# Patient Record
Sex: Female | Born: 2000 | Race: Black or African American | Hispanic: No | Marital: Single
Health system: Southern US, Community
[De-identification: ages and names within clinical notes are randomized; demographics above are authoritative.]

## PROBLEM LIST (undated history)

## (undated) ENCOUNTER — Inpatient Hospital Stay (HOSPITAL_COMMUNITY): Payer: Self-pay

## (undated) DIAGNOSIS — F32A Depression, unspecified: Secondary | ICD-10-CM

## (undated) DIAGNOSIS — F419 Anxiety disorder, unspecified: Secondary | ICD-10-CM

## (undated) DIAGNOSIS — R569 Unspecified convulsions: Secondary | ICD-10-CM

## (undated) DIAGNOSIS — F909 Attention-deficit hyperactivity disorder, unspecified type: Secondary | ICD-10-CM

## (undated) DIAGNOSIS — E669 Obesity, unspecified: Secondary | ICD-10-CM

## (undated) DIAGNOSIS — I10 Essential (primary) hypertension: Secondary | ICD-10-CM

## (undated) DIAGNOSIS — J302 Other seasonal allergic rhinitis: Secondary | ICD-10-CM

## (undated) HISTORY — DX: Depression, unspecified: F32.A

## (undated) HISTORY — DX: Anxiety disorder, unspecified: F41.9

## (undated) HISTORY — DX: Essential (primary) hypertension: I10

## (undated) HISTORY — PX: ADENOIDECTOMY: SUR15

## (undated) HISTORY — DX: Obesity, unspecified: E66.9

## (undated) HISTORY — DX: Unspecified convulsions: R56.9

---
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
## 2021-04-30 ENCOUNTER — Encounter (HOSPITAL_COMMUNITY): Payer: Self-pay | Admitting: Emergency Medicine

## 2021-04-30 ENCOUNTER — Ambulatory Visit (HOSPITAL_COMMUNITY)
Admission: EM | Admit: 2021-04-30 | Discharge: 2021-04-30 | Disposition: A | Discharge status: Home / Self Care | Payer: Medicaid Other | Attending: Emergency Medicine | Admitting: Emergency Medicine

## 2021-04-30 ENCOUNTER — Other Ambulatory Visit: Payer: Self-pay

## 2021-04-30 DIAGNOSIS — Z79899 Other long term (current) drug therapy: Secondary | ICD-10-CM | POA: Insufficient documentation

## 2021-04-30 DIAGNOSIS — J069 Acute upper respiratory infection, unspecified: Secondary | ICD-10-CM | POA: Diagnosis not present

## 2021-04-30 DIAGNOSIS — R0602 Shortness of breath: Secondary | ICD-10-CM | POA: Diagnosis not present

## 2021-04-30 DIAGNOSIS — R051 Acute cough: Secondary | ICD-10-CM | POA: Insufficient documentation

## 2021-04-30 DIAGNOSIS — Z20822 Contact with and (suspected) exposure to covid-19: Secondary | ICD-10-CM | POA: Diagnosis not present

## 2021-04-30 DIAGNOSIS — J029 Acute pharyngitis, unspecified: Secondary | ICD-10-CM | POA: Diagnosis present

## 2021-04-30 LAB — SARS CORONAVIRUS 2 (TAT 6-24 HRS): SARS Coronavirus 2: NEGATIVE

## 2021-04-30 LAB — POCT RAPID STREP A, ED / UC: Streptococcus, Group A Screen (Direct): NEGATIVE

## 2021-04-30 MED ORDER — BENZONATATE 200 MG PO CAPS: 200.0000 mg | ORAL_CAPSULE | Freq: Three times a day (TID) | ORAL | 0 refills | Status: AC | PRN

## 2021-04-30 MED ORDER — IBUPROFEN 600 MG PO TABS: 600.0000 mg | ORAL_TABLET | Freq: Four times a day (QID) | ORAL | 0 refills | Status: DC | PRN

## 2021-04-30 MED ORDER — ONDANSETRON 4 MG PO TBDP: 4.0000 mg | ORAL_TABLET | Freq: Three times a day (TID) | ORAL | 0 refills | Status: DC | PRN

## 2021-04-30 MED ORDER — CETIRIZINE HCL 10 MG PO CAPS: 10.0000 mg | ORAL_CAPSULE | Freq: Every day | ORAL | 0 refills | Status: DC

## 2021-04-30 NOTE — Discharge Instructions (Signed)
Strep negative, COVID pending Rest and fluids Tylenol and ibuprofen as needed Begin daily cetirizine up with congestion and drainage May supplement over-the-counter Mucinex or Sudafed for further relief of congestion May use Tessalon/benzonatate every 8 hours as needed for cough or may use other over-the-counter Robitussin, Delsym or Dimetapp Zofran as needed for nausea Please follow-up if not improving or worsening

## 2021-04-30 NOTE — ED Triage Notes (Signed)
Pt presents with sore throat, cough, nasal congestion, and SOB xs 3-4 days.

## 2021-05-01 NOTE — ED Provider Notes (Signed)
[facility]    CSN: [id number] Arrival date & time: 04/30/21  1131      History   Chief Complaint Chief Complaint  Patient presents with   Sore Throat   Nasal Congestion   Cough   Shortness of Breath    HPI Alison Cummings is a 20 y.o. female presenting today for evaluation of URI symptoms.  Reports over the past 3 to 4 days she has had cough congestion sore throat.  Has had associated nausea and some shortness of breath.  She denies any known fevers.  Denies close sick contacts.  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      Home Medications    Prior to Admission medications   Medication Sig Start Date End Date Taking? Authorizing Provider  benzonatate (TESSALON) 200 MG capsule Take 1 capsule (200 mg total) by mouth 3 (three) times daily as needed for up to 7 days for cough. 04/30/21 05/07/21 Yes Mihail Prettyman C, PA-C  Cetirizine HCl 10 MG CAPS Take 1 capsule (10 mg total) by mouth daily for 10 days. 04/30/21 05/10/21 Yes Chanler Mendonca C, PA-C  cloNIDine (CATAPRES) 0.3 MG tablet Take by mouth. 03/31/15  Yes [provider]  ibuprofen (ADVIL) 600 MG tablet Take 1 tablet (600 mg total) by mouth every 6 (six) hours as needed. 04/30/21  Yes Cheston Coury C, PA-C  ondansetron (ZOFRAN ODT) 4 MG disintegrating tablet Take 1 tablet (4 mg total) by mouth every 8 (eight) hours as needed for nausea or vomiting. 04/30/21  Yes Sylvia Helms C, PA-C  acetaminophen (TYLENOL) 325 MG tablet Take 2 tablets (650  mg total) by mouth every 6 (six) hours as needed. 08/03/18   Mesner, Barbara Cower, MD  ARIPiprazole (ABILIFY) 5 MG tablet Take 1 tablet (5 mg total) by mouth 2 (two) times daily. 08/03/18   Charm Rings, NP  escitalopram (LEXAPRO) 5 MG tablet Take 5 mg by mouth 2 (two) times daily. 04/25/21   [provider]  FOCALIN XR 40 MG CP24 Take 40 mg by mouth every morning. 11/13/17   [provider]  FOCALIN XR 5 MG 24 hr capsule Take 5 mg by mouth daily. 03/26/21   [provider]  INVEGA SUSTENNA 156 MG/ML SUSY injection Inject into the muscle every 30 (thirty) days. 03/28/20   [provider]  risperiDONE (RISPERDAL) 0.5 MG tablet Take 1 tablet (0.5 mg total) by mouth 2 (two) times daily. 08/03/18   Charm Rings, NP    Family History History reviewed. No pertinent family history.  Social History Social History   Tobacco Use   Smoking status: Never   Smokeless tobacco: Never  Substance Use Topics   Alcohol use: No   Drug use: No     Allergies   Patient has no known allergies.   Review of Systems Review of Systems  Constitutional:  Negative for activity change, appetite change, chills, fatigue and fever.  HENT:  Positive for congestion, rhinorrhea and sore throat. Negative for  ear pain, sinus pressure and trouble swallowing.   Eyes:  Negative for discharge and redness.  Respiratory:  Positive for cough and shortness of breath. Negative for chest tightness.   Cardiovascular:  Negative for chest pain.  Gastrointestinal:  Negative for abdominal pain, diarrhea, nausea and vomiting.  Musculoskeletal:  Negative for myalgias.  Skin:  Negative for rash.  Neurological:  Negative for dizziness, light-headedness and headaches.    Physical Exam Triage Vital Signs ED Triage Vitals  Enc Vitals Group     BP 04/30/21 1341 (!) 164/117     Pulse Rate 04/30/21 1341 90     Resp 04/30/21 1341 18     Temp 04/30/21 1341 98.9 F (37.2 C)     Temp Source 04/30/21 1341  Oral     SpO2 04/30/21 1341 97 %     Weight --      Height --      Head Circumference --      Peak Flow --      Pain Score 04/30/21 1339 0     Pain Loc --      Pain Edu? --      Excl. in GC? --    No data found.  Updated Vital Signs BP (!) 164/117 (BP Location: Left Wrist)   Pulse 90   Temp 98.9 F (37.2 C) (Oral)   Resp 18   SpO2 97%   Visual Acuity Right Eye Distance:   Left Eye Distance:   Bilateral Distance:    Right Eye Near:   Left Eye Near:    Bilateral Near:     Physical Exam Vitals and nursing note reviewed.  Constitutional:      Appearance: She is well-developed.     Comments: No acute distress  HENT:     Head: Normocephalic and atraumatic.     Ears:     Comments: Bilateral ears without tenderness to palpation of external auricle, tragus and mastoid, EAC's without erythema or swelling, TM's with good bony landmarks and cone of light. Non erythematous.      Nose: Nose normal.     Mouth/Throat:     Comments: Oral mucosa pink and moist, no tonsillar enlargement or exudate. Posterior pharynx patent and nonerythematous, no uvula deviation or swelling. Normal phonation.  Eyes:     Conjunctiva/sclera: Conjunctivae normal.  Cardiovascular:     Rate and Rhythm: Normal rate.  Pulmonary:     Effort: Pulmonary effort is normal. No respiratory distress.     Comments: Breathing comfortably at rest, CTABL, no wheezing, rales or other adventitious sounds auscultated  Abdominal:     General: There is no distension.  Musculoskeletal:        General: Normal range of motion.     Cervical back: Neck supple.  Skin:    General: Skin is warm and dry.  Neurological:     Mental Status: She is alert and oriented to person, place, and time.     UC Treatments / Results  Labs (all labs ordered are listed, but only abnormal results are displayed) Labs Reviewed  SARS CORONAVIRUS 2 (TAT 6-24 HRS)  CULTURE, GROUP A STREP [facility])  POCT RAPID STREP A, ED / UC     EKG   Radiology No results found.  Procedures Procedures (including critical care time)  Medications Ordered in UC Medications - No data to display  Initial Impression / Assessment and Plan / UC Course  I have reviewed the triage vital signs and the nursing notes.  Pertinent labs & imaging results that were available during my care of the patient were reviewed by me and considered in my medical decision making (see chart for details).     Viral URI with cough- strep negative, Covid test pending, exam reassuring, lungs clear to auscultation, suspect viral etiology and recommend symptomatic and supportive care at this time.  Recommendations provided.  Rest and fluids.  Continue to monitor.  Final Clinical Impressions(s) / UC Diagnoses   Final diagnoses:  Viral URI with cough     Discharge Instructions      Strep negative, COVID pending Rest and fluids Tylenol and ibuprofen as needed Begin daily cetirizine up with congestion and drainage May supplement over-the-counter Mucinex or Sudafed for further relief of congestion May use Tessalon/benzonatate every 8 hours as needed for cough or may use other over-the-counter Robitussin, Delsym or Dimetapp Zofran as needed for nausea Please follow-up if not improving or worsening   ED Prescriptions     Medication Sig Dispense Auth. Provider   Cetirizine HCl 10 MG CAPS Take 1 capsule (10 mg total) by mouth daily for 10 days. 10 capsule Leighanna Kirn C, PA-C   benzonatate (TESSALON) 200 MG capsule Take 1 capsule (200 mg total) by mouth 3 (three) times daily as needed for up to 7 days for cough. 28 capsule Zaniyah Wernette C, PA-C   ibuprofen (ADVIL) 600 MG tablet Take 1 tablet (600 mg total) by mouth every 6 (six) hours as needed. 30 tablet Regina Ganci C, PA-C   ondansetron (ZOFRAN ODT) 4 MG disintegrating tablet Take 1 tablet (4 mg total) by mouth every 8 (eight) hours as needed for nausea or vomiting. 20 tablet Larinda Herter,  [place] C, PA-C      PDMP not reviewed this encounter.   Lew Dawes, [place] 05/01/21 [phone]

## 2021-05-03 LAB — CULTURE, GROUP A STREP (THRC)

## 2021-05-19 ENCOUNTER — Encounter (HOSPITAL_COMMUNITY): Payer: Self-pay | Admitting: *Deleted

## 2021-05-19 ENCOUNTER — Other Ambulatory Visit: Payer: Self-pay

## 2021-05-19 ENCOUNTER — Ambulatory Visit (HOSPITAL_COMMUNITY)
Admission: EM | Admit: 2021-05-19 | Discharge: 2021-05-19 | Disposition: A | Discharge status: Home / Self Care | Payer: Medicaid Other | Attending: Internal Medicine | Admitting: Internal Medicine

## 2021-05-19 DIAGNOSIS — Z3202 Encounter for pregnancy test, result negative: Secondary | ICD-10-CM | POA: Diagnosis present

## 2021-05-19 DIAGNOSIS — R140 Abdominal distension (gaseous): Secondary | ICD-10-CM | POA: Diagnosis present

## 2021-05-19 LAB — POCT URINALYSIS DIPSTICK, ED / UC
Bilirubin Urine: NEGATIVE
Glucose, UA: NEGATIVE mg/dL
Hgb urine dipstick: NEGATIVE
Leukocytes,Ua: NEGATIVE
Nitrite: NEGATIVE
Protein, ur: NEGATIVE mg/dL
Specific Gravity, Urine: 1.025 (ref 1.005–1.030)
Urobilinogen, UA: 0.2 mg/dL (ref 0.0–1.0)
pH: 6.5 (ref 5.0–8.0)

## 2021-05-19 LAB — POC URINE PREG, ED: Preg Test, Ur: NEGATIVE

## 2021-05-19 NOTE — ED Triage Notes (Signed)
Pt requesting pregnancy test.

## 2021-05-19 NOTE — Discharge Instructions (Addendum)
Take a probiotic daily.  Make sure you are drinking plenty of fluids, especially water, and try to increase the amount of fiber in your diet.    You can take Miralax daily as needed for constipation.   Follow up with your OB/GYN for re-evaluation as soon as possible.  Follow up with your primary care provider for re-evaluation of bloating, diarrhea, and constipation.

## 2021-05-19 NOTE — ED Provider Notes (Signed)
[facility]    CSN: [id number] Arrival date & time: 05/19/21  1236      History   Chief Complaint Chief Complaint  Patient presents with   Possible Pregnancy    HPI Alison Cummings is a 20 y.o. female.   Patient here for evaluation of vaginal discharge, nipple tenderness, bloating, diarrhea, and constipation.  Reports bloating and constipation have been ongoing for a while and was given some medication with minimal symptom relief.  Patient is unsure when her LMP was and states that her period is often irregular.  Concerned about a possible pregnancy.  Denies any trauma, injury, or other precipitating event.  Denies any specific alleviating or aggravating factors.  Denies any fevers, chest pain, shortness of breath, N/V/D, numbness, tingling, weakness, or headaches.    The history is provided by the patient.  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      Home Medications    Prior to Admission medications   Medication Sig Start Date End Date Taking? Authorizing Provider  acetaminophen (TYLENOL) 325 MG tablet Take 2 tablets (650 mg total) by mouth every 6 (six) hours as needed. 08/03/18   Mesner, Barbara Cower, MD  ARIPiprazole (ABILIFY) 5 MG tablet Take 1 tablet (5 mg total) by mouth 2 (two) times daily. 08/03/18   Charm Rings, NP  Cetirizine HCl 10 MG CAPS Take 1 capsule (10 mg total) by mouth daily for 10 days. 04/30/21 05/10/21  Wieters, Hallie C, PA-C  cloNIDine (CATAPRES) 0.3 MG tablet Take  by mouth. 03/31/15   [provider]  escitalopram (LEXAPRO) 5 MG tablet Take 5 mg by mouth 2 (two) times daily. 04/25/21   [provider]  FOCALIN XR 40 MG CP24 Take 40 mg by mouth every morning. 11/13/17   [provider]  FOCALIN XR 5 MG 24 hr capsule Take 5 mg by mouth daily. 03/26/21   [provider]  ibuprofen (ADVIL) 600 MG tablet Take 1 tablet (600 mg total) by mouth every 6 (six) hours as needed. 04/30/21   Wieters, Hallie C, PA-C  INVEGA SUSTENNA 156 MG/ML SUSY injection Inject into the muscle every 30 (thirty) days. 03/28/20   [provider]  ondansetron (ZOFRAN ODT) 4 MG disintegrating tablet Take 1 tablet (4 mg total) by mouth every 8 (eight) hours as needed for nausea or vomiting. 04/30/21   Wieters, Hallie C, PA-C  risperiDONE (RISPERDAL) 0.5 MG tablet Take 1 tablet (0.5 mg total) by mouth 2 (two) times daily. 08/03/18   Charm Rings, NP    Family History History reviewed. No pertinent family history.  Social History Social History   Tobacco Use   Smoking status: Never   Smokeless tobacco: Never  Substance Use Topics   Alcohol use: No   Drug use: No     Allergies   Patient has no known allergies.   Review of Systems Review of Systems  Gastrointestinal:  Positive for constipation and diarrhea.  Genitourinary:  Positive for vaginal  discharge. Negative for vaginal bleeding and vaginal pain.  All other systems reviewed and are negative.   Physical Exam Triage Vital Signs ED Triage Vitals  Enc Vitals Group     BP 05/19/21 1409 131/84     Pulse Rate 05/19/21 1409 99     Resp 05/19/21 1409 16     Temp 05/19/21 1409 98.7 F (37.1 C)     Temp src --      SpO2 05/19/21 1409 97 %     Weight --      Height --      Head Circumference --      Peak Flow --      Pain Score 05/19/21 1406 8     Pain Loc --      Pain Edu? --      Excl. in GC? --    No data found.  Updated Vital Signs BP 131/84   Pulse 99   Temp 98.7  F (37.1 C)   Resp 16   LMP  (LMP Unknown)   SpO2 97%   Visual Acuity Right Eye Distance:   Left Eye Distance:   Bilateral Distance:    Right Eye Near:   Left Eye Near:    Bilateral Near:     Physical Exam Vitals and nursing note reviewed.  Constitutional:      General: She is not in acute distress.    Appearance: Normal appearance. She is not ill-appearing, toxic-appearing or diaphoretic.  HENT:     Head: Normocephalic and atraumatic.  Eyes:     Conjunctiva/sclera: Conjunctivae normal.  Cardiovascular:     Rate and Rhythm: Normal rate.     Pulses: Normal pulses.  Pulmonary:     Effort: Pulmonary effort is normal.  Chest:  Breasts:    Right: No nipple discharge.     Left: No nipple discharge.  Abdominal:     General: Abdomen is flat. Bowel sounds are normal.     Palpations: Abdomen is soft.     Tenderness: There is no right CVA tenderness or left CVA tenderness.  Musculoskeletal:        General: Normal range of motion.     Cervical back: Normal range of motion.  Skin:    General: Skin is warm and dry.  Neurological:     General: No focal deficit present.     Mental Status: She is alert and oriented to person, place, and time.  Psychiatric:        Mood and Affect: Mood normal.     UC Treatments / Results  Labs (all labs ordered are listed, but only abnormal results are displayed) Labs Reviewed  POCT URINALYSIS DIPSTICK, ED / UC - Abnormal; Notable for the following components:      Result Value   Ketones, ur TRACE (*)    All other components within normal limits  POC URINE PREG, ED  CERVICOVAGINAL ANCILLARY ONLY    EKG   Radiology No results found.  Procedures Procedures (including critical care time)  Medications Ordered in UC Medications - No data to display  Initial Impression / Assessment and Plan / UC Course  I have reviewed the triage vital signs and the nursing notes.  Pertinent labs & imaging results that were available during my  care of the patient were reviewed by me and considered in my medical decision making (see chart for details).    Assessment negative for red flags or concerns.  Urinalysis positive for ketones but otherwise  negative.  Urine pregnancy test negative.  Recommend following up with OB/GYN for further evaluation of possible nipple discharge.  Self swab obtained and will treat based on results.  Discussed the safe practices including condoms and other barrier method use. Bloating.  Recommend daily probiotic.  Encourage fluids and increase fiber in diet.  May take MiraLAX as needed for constipation.  Follow-up with primary care provider for reevaluation of soon as possible. Final Clinical Impressions(s) / UC Diagnoses   Final diagnoses:  Negative pregnancy test  Bloating     Discharge Instructions      Take a probiotic daily.  Make sure you are drinking plenty of fluids, especially water, and try to increase the amount of fiber in your diet.    You can take Miralax daily as needed for constipation.   Follow up with your OB/GYN for re-evaluation as soon as possible.  Follow up with your primary care provider for re-evaluation of bloating, diarrhea, and constipation.        ED Prescriptions   None    PDMP not reviewed this encounter.   Ivette Loyal, NP 05/19/21 1446

## 2021-05-21 LAB — CERVICOVAGINAL ANCILLARY ONLY
Bacterial Vaginitis (gardnerella): NEGATIVE
Candida Glabrata: NEGATIVE
Candida Vaginitis: NEGATIVE
Chlamydia: NEGATIVE
Comment: NEGATIVE
Comment: NEGATIVE
Comment: NEGATIVE
Comment: NEGATIVE
Comment: NEGATIVE
Comment: NORMAL
Neisseria Gonorrhea: NEGATIVE
Trichomonas: NEGATIVE

## 2021-06-19 ENCOUNTER — Ambulatory Visit: Payer: Self-pay

## 2021-09-19 ENCOUNTER — Other Ambulatory Visit: Payer: Self-pay

## 2021-09-19 ENCOUNTER — Other Ambulatory Visit: Payer: Self-pay | Admitting: Family Medicine

## 2021-09-19 ENCOUNTER — Ambulatory Visit
Admission: RE | Admit: 2021-09-19 | Discharge: 2021-09-19 | Disposition: A | Discharge status: Home / Self Care | Payer: Medicaid Other | Source: Ambulatory Visit | Attending: Family Medicine | Admitting: Family Medicine

## 2021-09-19 DIAGNOSIS — M25569 Pain in unspecified knee: Secondary | ICD-10-CM

## 2022-05-23 ENCOUNTER — Encounter (HOSPITAL_BASED_OUTPATIENT_CLINIC_OR_DEPARTMENT_OTHER): Payer: Self-pay

## 2022-05-23 ENCOUNTER — Other Ambulatory Visit: Payer: Self-pay

## 2022-05-23 ENCOUNTER — Emergency Department (HOSPITAL_BASED_OUTPATIENT_CLINIC_OR_DEPARTMENT_OTHER)
Admission: EM | Admit: 2022-05-23 | Discharge: 2022-05-24 | Disposition: A | Discharge status: Home / Self Care | Payer: Medicaid Other | Attending: Emergency Medicine | Admitting: Emergency Medicine

## 2022-05-23 DIAGNOSIS — R102 Pelvic and perineal pain: Secondary | ICD-10-CM

## 2022-05-23 DIAGNOSIS — N760 Acute vaginitis: Secondary | ICD-10-CM

## 2022-05-23 LAB — COMPREHENSIVE METABOLIC PANEL
ALT: 17 U/L (ref 0–44)
AST: 16 U/L (ref 15–41)
Albumin: 4.2 g/dL (ref 3.5–5.0)
Alkaline Phosphatase: 47 U/L (ref 38–126)
Anion gap: 8 (ref 5–15)
BUN: 12 mg/dL (ref 6–20)
CO2: 25 mmol/L (ref 22–32)
Calcium: 9.5 mg/dL (ref 8.9–10.3)
Chloride: 107 mmol/L (ref 98–111)
Creatinine, Ser: 0.98 mg/dL (ref 0.44–1.00)
GFR, Estimated: >60 mL/min (ref >60)
Glucose, Bld: 65 mg/dL — ABNORMAL LOW (ref 70–99)
Potassium: 3.8 mmol/L (ref 3.5–5.1)
Sodium: 140 mmol/L (ref 135–145)
Total Bilirubin: 0.5 mg/dL (ref 0.3–1.2)
Total Protein: 7.5 g/dL (ref 6.5–8.1)

## 2022-05-23 LAB — URINALYSIS, ROUTINE W REFLEX MICROSCOPIC
Bilirubin Urine: NEGATIVE
Glucose, UA: NEGATIVE mg/dL
Hgb urine dipstick: NEGATIVE
Ketones, ur: NEGATIVE mg/dL
Nitrite: NEGATIVE
Specific Gravity, Urine: 1.037 — ABNORMAL HIGH (ref 1.005–1.030)
pH: 5.5 (ref 5.0–8.0)

## 2022-05-23 LAB — CBC
HCT: 40.6 % (ref 36.0–46.0)
Hemoglobin: 13.1 g/dL (ref 12.0–15.0)
MCH: 26.5 pg (ref 26.0–34.0)
MCHC: 32.3 g/dL (ref 30.0–36.0)
MCV: 82.2 fL (ref 80.0–100.0)
Platelets: 244 10*3/uL (ref 150–400)
RBC: 4.94 MIL/uL (ref 3.87–5.11)
RDW: 14.6 % (ref 11.5–15.5)
WBC: 7.2 10*3/uL (ref 4.0–10.5)
nRBC: 0 % (ref 0.0–0.2)

## 2022-05-23 LAB — LIPASE, BLOOD: Lipase: 17 U/L (ref 11–51)

## 2022-05-23 LAB — HCG, SERUM, QUALITATIVE: Preg, Serum: NEGATIVE

## 2022-05-23 NOTE — ED Triage Notes (Signed)
Patient here POV from Home.  Endorses Bilateral Lower ABD Pain for 2-4 Days now. Worsened today.   No fevers. No N/V/D. Some Vaginal Discharge noted.  NAD Noted during Triage. A&Ox4. GCS 15. Ambulatory.

## 2022-05-24 LAB — WET PREP, GENITAL
Sperm: NONE SEEN
Trich, Wet Prep: NONE SEEN
WBC, Wet Prep HPF POC: <10 (ref <10)
Yeast Wet Prep HPF POC: NONE SEEN

## 2022-05-24 MED ORDER — CEFTRIAXONE SODIUM 500 MG IJ SOLR
500.0000 mg | Freq: Once | INTRAMUSCULAR | Status: AC
  Administered 2022-05-24: 500 mg via INTRAMUSCULAR
  Filled 2022-05-24: qty 500

## 2022-05-24 MED ORDER — METRONIDAZOLE 500 MG PO TABS: 500.0000 mg | ORAL_TABLET | Freq: Two times a day (BID) | ORAL | 0 refills | Status: DC

## 2022-05-24 MED ORDER — AZITHROMYCIN 250 MG PO TABS
1000.0000 mg | ORAL_TABLET | Freq: Once | ORAL | Status: AC
  Administered 2022-05-24: 1000 mg via ORAL
  Filled 2022-05-24: qty 4

## 2022-05-24 NOTE — ED Provider Notes (Signed)
[facility] EMERGENCY DEPT Provider Note   CSN: [id number] Arrival date & time: 05/23/22  2129     History  Chief Complaint  Patient presents with   Abdominal Pain    Alison Cummings is a 21 y.o. female.  Patient is a 21 year old female with past medical history of ADHD, prior diagnosis of gonorrhea.  Patient presenting today with complaints of vaginal discharge and lower abdominal pain.  This has been worsening over the past few days.  She describes a "lotion like" discharge.  She denies any fevers or chills.  She is sexually active with 1 partner and denies this individual experiencing symptoms.  There are no aggravating or alleviating factors.  The history is provided by the patient.       Home Medications Prior to Admission medications   Medication Sig Start Date End Date Taking? Authorizing Provider  acetaminophen (TYLENOL) 325 MG tablet Take 2 tablets (650 mg total) by mouth every 6 (six) hours as needed. 08/03/18   Mesner, Corene Cornea, MD  ARIPiprazole (ABILIFY) 5 MG tablet Take 1 tablet (5 mg total) by mouth 2 (two) times daily. 08/03/18   Patrecia Pour, NP  Cetirizine HCl 10 MG CAPS Take 1 capsule (10 mg total) by mouth daily for 10 days. 04/30/21 05/10/21  Wieters, Hallie C, PA-C  cloNIDine (CATAPRES) 0.3 MG tablet Take by mouth. 03/31/15   [provider]  escitalopram (LEXAPRO) 5 MG tablet Take 5 mg by mouth 2 (two) times daily. 04/25/21   [provider]  FOCALIN XR 40 MG CP24 Take 40 mg by mouth every morning. 11/13/17   [provider]  FOCALIN XR 5 MG 24 hr capsule Take 5 mg by mouth daily. 03/26/21   [provider]  ibuprofen (ADVIL) 600 MG tablet Take 1 tablet (600 mg total) by mouth every 6 (six) hours as needed. 04/30/21   Wieters, Hallie C, PA-C  INVEGA SUSTENNA 156 MG/ML SUSY injection Inject into the muscle every 30 (thirty) days. 03/28/20   [provider]  ondansetron (ZOFRAN ODT) 4 MG disintegrating  tablet Take 1 tablet (4 mg total) by mouth every 8 (eight) hours as needed for nausea or vomiting. 04/30/21   Wieters, Hallie C, PA-C  risperiDONE (RISPERDAL) 0.5 MG tablet Take 1 tablet (0.5 mg total) by mouth 2 (two) times daily. 08/03/18   Patrecia Pour, NP      Allergies    Patient has no known allergies.    Review of Systems   Review of Systems  All other systems reviewed and are negative.   Physical Exam Updated Vital Signs BP (!) 141/88 (BP Location: Right Arm)   Pulse 94   Temp 98.6 F (37 C) (Oral)   Resp 18   Ht 5\' 2"  (1.575 m)   Wt 108.4 kg   SpO2 99%   BMI 43.71 kg/m  Physical Exam Vitals and nursing note reviewed.  Constitutional:      General: She is not in acute distress.    Appearance: She is well-developed. She is not diaphoretic.  HENT:     Head: Normocephalic and atraumatic.  Cardiovascular:     Rate and Rhythm: Normal rate and regular rhythm.     Heart sounds: No murmur heard.    No friction rub. No gallop.  Pulmonary:     Effort: Pulmonary effort is normal. No respiratory distress.     Breath sounds: Normal breath sounds. No wheezing.  Abdominal:     General: Bowel  sounds are normal. There is no distension.     Palpations: Abdomen is soft.     Tenderness: There is abdominal tenderness in the right lower quadrant, suprapubic area and left lower quadrant. There is no right CVA tenderness or left CVA tenderness.  Musculoskeletal:        General: Normal range of motion.     Cervical back: Normal range of motion and neck supple.  Skin:    General: Skin is warm and dry.  Neurological:     General: No focal deficit present.     Mental Status: She is alert and oriented to person, place, and time.     ED Results / Procedures / Treatments   Labs (all labs ordered are listed, but only abnormal results are displayed) Labs Reviewed  COMPREHENSIVE METABOLIC PANEL - Abnormal; Notable for the following components:      Result Value   Glucose, Bld 65  (*)    All other components within normal limits  URINALYSIS, ROUTINE W REFLEX MICROSCOPIC - Abnormal; Notable for the following components:   APPearance HAZY (*)    Specific Gravity, Urine 1.037 (*)    Protein, ur TRACE (*)    Leukocytes,Ua SMALL (*)    Bacteria, UA RARE (*)    All other components within normal limits  WET PREP, GENITAL  LIPASE, BLOOD  CBC  HCG, SERUM, QUALITATIVE  GC/CHLAMYDIA PROBE AMP ([facility]) NOT AT [facility]    EKG None  Radiology No results found.  Procedures Procedures    Medications Ordered in ED Medications - No data to display  ED Course/ Medical Decision Making/ A&P  Patient presenting here with complaints of vaginal discharge and pelvic pain as described in the HPI.    Patient arrives here afebrile and with stable vital signs.  She is clinically well-appearing.  There is mild tenderness across the lower abdomen with no peritoneal signs.  Pelvic examination reveals minimal discharge and no obvious findings.  Work-up initiated including CBC, metabolic panel, urinalysis, wet prep, and GC/chlamydia.  Results thus far show clue cells, but are otherwise unremarkable.  GC and Chlamydia cultures are pending.  Patient has history of gonorrhea in the past and is requesting STD treatment pending cultures.  She will be given Rocephin and Zithromax.  She will also be discharged with Flagyl for presumed BV.  To follow-up as needed.  Final Clinical Impression(s) / ED Diagnoses Final diagnoses:  None    Rx / DC Orders ED Discharge Orders     None         Geoffery Lyons, MD 05/24/22 0126

## 2022-05-24 NOTE — Discharge Instructions (Signed)
Begin taking Flagyl as prescribed.  We will call you if your cultures indicate you require further treatment or need to take additional action.  Return to the emergency department if symptoms significantly worsen or change.

## 2022-05-27 LAB — GC/CHLAMYDIA PROBE AMP (~~LOC~~) NOT AT ARMC
Chlamydia: NEGATIVE
Comment: NEGATIVE
Comment: NORMAL
Neisseria Gonorrhea: NEGATIVE

## 2022-05-30 ENCOUNTER — Inpatient Hospital Stay (HOSPITAL_COMMUNITY)
Admission: AD | Admit: 2022-05-30 | Discharge: 2022-05-30 | Discharge status: Against Medical Advice | Payer: Medicaid Other | Attending: Obstetrics and Gynecology | Admitting: Obstetrics and Gynecology

## 2022-05-30 DIAGNOSIS — Z3202 Encounter for pregnancy test, result negative: Secondary | ICD-10-CM | POA: Diagnosis present

## 2022-05-30 DIAGNOSIS — Z5321 Procedure and treatment not carried out due to patient leaving prior to being seen by health care provider: Secondary | ICD-10-CM | POA: Insufficient documentation

## 2022-05-30 DIAGNOSIS — R109 Unspecified abdominal pain: Secondary | ICD-10-CM | POA: Diagnosis not present

## 2022-05-30 LAB — POCT PREGNANCY, URINE: Preg Test, Ur: NEGATIVE

## 2022-05-30 LAB — HCG, QUANTITATIVE, PREGNANCY: hCG, Beta Chain, Quant, S: <1 m[IU]/mL (ref <5)

## 2022-05-30 LAB — ABO/RH: ABO/RH(D): O POS

## 2022-05-30 NOTE — MAU Note (Signed)
.  Alison Cummings is a 21 y.o. at Unknown here in MAU reporting: right sided flank and lower ABD cramping all afternoon yesterday and pink VB and a small clot with wiping since 0100. Pt stats she went to [facility] last week and had a neg HCG, but she was not feeling well and took a home preg test and it was pos with a faint line. Pt report has BV and been on flagyl last week.  LMP: irreg periods, 09/27/2021 Onset of complaint: yesterday Pain score: 7/10 Vitals:   05/30/22 0146  BP: 126/71  Pulse: 89  Resp: 18  Temp: 98.3 F (36.8 C)  SpO2: 100%      Lab orders placed from triage:  UPT

## 2022-05-30 NOTE — MAU Provider Note (Signed)
None     S Ms. LAGINA READER is a 21 y.o. No obstetric history on file. patient who presents to MAU today with complaint of light bleeding, lower abdominal cramping and faint positive pregnancy test (only observed "a long time" after taking test) States has not had a period since Feb Saw Eve Key in office and "she didn't do anything but say "birth control".  When questioned further, it seems that she did discuss IUD and ordered a Provera challenge. Pt never had a withdrawal bleed but did not call Eve back to report this. Does not plan to get pregnant.  States doesn't know why she is not using birth control or why she never called office about followup or with recent symptoms.  Discussed we do only OB care here, will need to send blood for HCG.  HCG negative 6 days ago in ED.Marland Kitchen   RN Note: ADELFA LOZITO is a 21 y.o. at Unknown here in MAU reporting: right sided flank and lower ABD cramping all afternoon yesterday and pink VB and a small clot with wiping since 0100. Pt stats she went to [facility] last week and had a neg HCG, but she was not feeling well and took a home preg test and it was pos with a faint line. Pt report has BV and been on flagyl last week.   Results for orders placed or performed during the hospital encounter of 05/30/22 (from the past 24 hour(s))  Pregnancy, urine POC     Status: None   Collection Time: 05/30/22  1:54 AM  Result Value Ref Range   Preg Test, Ur NEGATIVE NEGATIVE  hCG, quantitative, pregnancy     Status: None   Collection Time: 05/30/22  2:16 AM  Result Value Ref Range   hCG, Beta Chain, Quant, S <1 <5 mIU/mL  ABO/Rh     Status: None   Collection Time: 05/30/22  2:22 AM  Result Value Ref Range   ABO/RH(D) O POS    No rh immune globuloin      NOT A RH IMMUNE GLOBULIN CANDIDATE, PT RH POSITIVE Performed at [facility], [address], [place], [place] [address]     O BP 126/71 (BP Location: Right Arm)   Pulse 89   Temp 98.3 F (36.8  C) (Oral)   Resp 18   Ht 5\' 2"  (1.575 m)   Wt 106.7 kg   SpO2 100%   BMI 43.02 kg/m  Physical Exam Constitutional:      General: She is not in acute distress.    Appearance: She is well-developed. She is obese. She is not ill-appearing or toxic-appearing.  HENT:     Head: Normocephalic.  Cardiovascular:     Rate and Rhythm: Normal rate.  Pulmonary:     Effort: Pulmonary effort is normal.  Skin:    General: Skin is warm and dry.  Neurological:     General: No focal deficit present.     Mental Status: She is alert.  Psychiatric:        Mood and Affect: Mood normal.     A Medical screening exam complete   P Left AMA in stable condition, states needs to go. Patient given the option of transfer to ED for further evaluation or seek care in outpatient facility of choice  Recommend call OB office today Warning signs for worsening condition that would warrant emergency follow-up discussed Patient may return to MAU as needed    L, CNM 05/30/2022 3:12 AM

## 2022-06-02 ENCOUNTER — Emergency Department (HOSPITAL_BASED_OUTPATIENT_CLINIC_OR_DEPARTMENT_OTHER)
Admission: EM | Admit: 2022-06-02 | Discharge: 2022-06-03 | Disposition: A | Discharge status: Home / Self Care | Payer: Medicaid Other | Attending: Emergency Medicine | Admitting: Emergency Medicine

## 2022-06-02 ENCOUNTER — Encounter (HOSPITAL_BASED_OUTPATIENT_CLINIC_OR_DEPARTMENT_OTHER): Payer: Self-pay

## 2022-06-02 ENCOUNTER — Other Ambulatory Visit: Payer: Self-pay

## 2022-06-02 DIAGNOSIS — N939 Abnormal uterine and vaginal bleeding, unspecified: Secondary | ICD-10-CM | POA: Diagnosis not present

## 2022-06-02 LAB — HCG, SERUM, QUALITATIVE: Preg, Serum: NEGATIVE

## 2022-06-02 LAB — COMPREHENSIVE METABOLIC PANEL
ALT: 36 U/L (ref 0–44)
AST: 41 U/L (ref 15–41)
Albumin: 4.5 g/dL (ref 3.5–5.0)
Alkaline Phosphatase: 50 U/L (ref 38–126)
Anion gap: 10 (ref 5–15)
BUN: 12 mg/dL (ref 6–20)
CO2: 25 mmol/L (ref 22–32)
Calcium: 9.6 mg/dL (ref 8.9–10.3)
Chloride: 102 mmol/L (ref 98–111)
Creatinine, Ser: 0.81 mg/dL (ref 0.44–1.00)
GFR, Estimated: >60 mL/min (ref >60)
Glucose, Bld: 81 mg/dL (ref 70–99)
Potassium: 3.6 mmol/L (ref 3.5–5.1)
Sodium: 137 mmol/L (ref 135–145)
Total Bilirubin: 0.4 mg/dL (ref 0.3–1.2)
Total Protein: 8 g/dL (ref 6.5–8.1)

## 2022-06-02 LAB — CBC
HCT: 42 % (ref 36.0–46.0)
Hemoglobin: 13.6 g/dL (ref 12.0–15.0)
MCH: 26.6 pg (ref 26.0–34.0)
MCHC: 32.4 g/dL (ref 30.0–36.0)
MCV: 82.2 fL (ref 80.0–100.0)
Platelets: 246 10*3/uL (ref 150–400)
RBC: 5.11 MIL/uL (ref 3.87–5.11)
RDW: 14.8 % (ref 11.5–15.5)
WBC: 5.6 10*3/uL (ref 4.0–10.5)
nRBC: 0 % (ref 0.0–0.2)

## 2022-06-02 MED ORDER — IBUPROFEN 400 MG PO TABS
400.0000 mg | ORAL_TABLET | Freq: Once | ORAL | Status: DC | PRN
  Filled 2022-06-02: qty 1

## 2022-06-02 MED ORDER — ACETAMINOPHEN 500 MG PO TABS
1000.0000 mg | ORAL_TABLET | Freq: Once | ORAL | Status: AC
  Administered 2022-06-02: 1000 mg via ORAL
  Filled 2022-06-02: qty 2

## 2022-06-02 NOTE — ED Notes (Signed)
Was called to the waiting room for assistance. Pt states that she feels like her vision is going blurry and like she is going to pass out. Pt pulled back to room at this time. VSS.

## 2022-06-02 NOTE — ED Triage Notes (Signed)
Pt reports heavy vaginal bleeding, abdominal cramping, lower back pain and fatigue since Thursday.

## 2022-06-03 LAB — URINALYSIS, ROUTINE W REFLEX MICROSCOPIC
Bilirubin Urine: NEGATIVE
Glucose, UA: NEGATIVE mg/dL
Ketones, ur: NEGATIVE mg/dL
Leukocytes,Ua: NEGATIVE
Nitrite: NEGATIVE
Specific Gravity, Urine: 1.029 (ref 1.005–1.030)
pH: 5.5 (ref 5.0–8.0)

## 2022-06-03 LAB — WET PREP, GENITAL
Clue Cells Wet Prep HPF POC: NONE SEEN
Sperm: NONE SEEN
Trich, Wet Prep: NONE SEEN
WBC, Wet Prep HPF POC: <10 (ref <10)
Yeast Wet Prep HPF POC: NONE SEEN

## 2022-06-03 LAB — HEMOGLOBIN AND HEMATOCRIT, BLOOD
HCT: 39.5 % (ref 36.0–46.0)
Hemoglobin: 12.8 g/dL (ref 12.0–15.0)

## 2022-06-03 NOTE — ED Notes (Signed)
Pt verbalizes understanding of discharge instructions. Opportunity for questioning and answers were provided. Pt discharged from ED to home with mother. Pt refused discharge vitals. Ambulatory.

## 2022-06-03 NOTE — ED Provider Notes (Signed)
[facility] EMERGENCY DEPT Provider Note   CSN: [id number] Arrival date & time: 06/02/22  1701     History  Chief Complaint  Patient presents with   Vaginal Bleeding   Abdominal Cramping    Alison Cummings is a 21 y.o. female.  Patient is a 21 year old female with past medical history of ADHD.  Patient presenting today with complaints of vaginal bleeding.  This has been ongoing for the past 4 days.  This began in the absence of any injury or trauma.  She reports recurrent episodes of bleeding and passage of clots.  She denies any abdominal pain, fevers, or chills.  Patient tells me she has not had a period since February, and has a history of irregular menses since having her Nexplanon implant removed 3 years ago.  Patient reports she is sexually active with 1 partner.  The history is provided by the patient.       Home Medications Prior to Admission medications   Medication Sig Start Date End Date Taking? Authorizing Provider  acetaminophen (TYLENOL) 325 MG tablet Take 2 tablets (650 mg total) by mouth every 6 (six) hours as needed. 08/03/18   Mesner, Barbara Cower, MD  ARIPiprazole (ABILIFY) 5 MG tablet Take 1 tablet (5 mg total) by mouth 2 (two) times daily. 08/03/18   Charm Rings, NP  Cetirizine HCl 10 MG CAPS Take 1 capsule (10 mg total) by mouth daily for 10 days. 04/30/21 05/10/21  Wieters, Hallie C, PA-C  cloNIDine (CATAPRES) 0.3 MG tablet Take by mouth. 03/31/15   [provider]  escitalopram (LEXAPRO) 5 MG tablet Take 5 mg by mouth 2 (two) times daily. 04/25/21   [provider]  FOCALIN XR 40 MG CP24 Take 40 mg by mouth every morning. 11/13/17   [provider]  FOCALIN XR 5 MG 24 hr capsule Take 5 mg by mouth daily. 03/26/21   [provider]  ibuprofen (ADVIL) 600 MG tablet Take 1 tablet (600 mg total) by mouth every 6 (six) hours as needed. 04/30/21   Wieters, Hallie C, PA-C  INVEGA SUSTENNA 156 MG/ML SUSY injection Inject  into the muscle every 30 (thirty) days. 03/28/20   [provider]  metroNIDAZOLE (FLAGYL) 500 MG tablet Take 1 tablet (500 mg total) by mouth 2 (two) times daily. One po bid x 7 days 05/24/22   Geoffery Lyons, MD  ondansetron (ZOFRAN ODT) 4 MG disintegrating tablet Take 1 tablet (4 mg total) by mouth every 8 (eight) hours as needed for nausea or vomiting. 04/30/21   Wieters, Hallie C, PA-C  risperiDONE (RISPERDAL) 0.5 MG tablet Take 1 tablet (0.5 mg total) by mouth 2 (two) times daily. 08/03/18   Charm Rings, NP      Allergies    Ibuprofen    Review of Systems   Review of Systems  All other systems reviewed and are negative.   Physical Exam Updated Vital Signs BP 106/70   Pulse 98   Temp 98.1 F (36.7 C) (Oral)   Resp 16   Ht 5\' 2"  (1.575 m)   Wt 106.6 kg   SpO2 98%   BMI 42.98 kg/m  Physical Exam Vitals and nursing note reviewed.  Constitutional:      General: She is not in acute distress.    Appearance: She is well-developed. She is not diaphoretic.  HENT:     Head: Normocephalic and atraumatic.  Cardiovascular:     Rate and Rhythm: Normal rate and regular rhythm.  Heart sounds: No murmur heard.    No friction rub. No gallop.  Pulmonary:     Effort: Pulmonary effort is normal. No respiratory distress.     Breath sounds: Normal breath sounds. No wheezing.  Abdominal:     General: Bowel sounds are normal. There is no distension.     Palpations: Abdomen is soft.     Tenderness: There is no abdominal tenderness.  Musculoskeletal:        General: Normal range of motion.     Cervical back: Normal range of motion and neck supple.  Skin:    General: Skin is warm and dry.  Neurological:     General: No focal deficit present.     Mental Status: She is alert and oriented to person, place, and time.     ED Results / Procedures / Treatments   Labs (all labs ordered are listed, but only abnormal results are displayed) Labs Reviewed  URINALYSIS, ROUTINE W  REFLEX MICROSCOPIC - Abnormal; Notable for the following components:      Result Value   Hgb urine dipstick LARGE (*)    Protein, ur TRACE (*)    All other components within normal limits  WET PREP, GENITAL  CBC  HCG, SERUM, QUALITATIVE  COMPREHENSIVE METABOLIC PANEL  HEMOGLOBIN AND HEMATOCRIT, BLOOD  GC/CHLAMYDIA PROBE AMP ([facility]) NOT AT [facility]    EKG None  Radiology No results found.  Procedures Procedures    Medications Ordered in ED Medications  acetaminophen (TYLENOL) tablet 1,000 mg (1,000 mg Oral Given 06/02/22 2202)    ED Course/ Medical Decision Making/ A&P  Patient presenting here with complaints of vaginal bleeding for the past 4 days.  She reports not having had a period since February, however this is not unusual for her.    She arrives with stable vital signs and is afebrile.  Her abdomen is benign and pelvic examination reveals dark blood, however no active hemorrhage.  Work-up initiated including laboratory studies.  This showed a hemoglobin of 13.6, no leukocytosis, and is otherwise unremarkable.  Her pregnancy test is negative.  Urinalysis reveals only blood.  Wet prep shows no evidence for BV or yeast.  GC and chlamydia pending.  At this point, I feel as though her bleeding is likely menstrual period pregnancy test is negative thus ruling out miscarriage/ectopic.  Patient will be discharged and arrangements to be made for an outpatient ultrasound.  I will also have her follow-up with her GYN later this week.  Final Clinical Impression(s) / ED Diagnoses Final diagnoses:  None    Rx / DC Orders ED Discharge Orders     None         Veryl Speak, MD 06/03/22 [phone]

## 2022-06-03 NOTE — Discharge Instructions (Signed)
Returned at the given time for an ultrasound.  Follow-up with your OB/GYN later this week, and return to the ER if bleeding significantly worsens, you develop high fevers, severe abdominal pain, or for other new and concerning symptoms.

## 2022-06-04 ENCOUNTER — Ambulatory Visit (HOSPITAL_BASED_OUTPATIENT_CLINIC_OR_DEPARTMENT_OTHER)
Admission: RE | Admit: 2022-06-04 | Discharge: 2022-06-04 | Disposition: A | Discharge status: Home / Self Care | Payer: Medicaid Other | Source: Ambulatory Visit | Attending: Emergency Medicine | Admitting: Emergency Medicine

## 2022-06-04 ENCOUNTER — Other Ambulatory Visit (HOSPITAL_BASED_OUTPATIENT_CLINIC_OR_DEPARTMENT_OTHER): Payer: Self-pay | Admitting: Emergency Medicine

## 2022-06-04 DIAGNOSIS — N939 Abnormal uterine and vaginal bleeding, unspecified: Secondary | ICD-10-CM | POA: Insufficient documentation

## 2022-06-04 LAB — GC/CHLAMYDIA PROBE AMP (~~LOC~~) NOT AT ARMC
Chlamydia: NEGATIVE
Comment: NEGATIVE
Comment: NORMAL
Neisseria Gonorrhea: NEGATIVE

## 2022-06-13 ENCOUNTER — Encounter (HOSPITAL_COMMUNITY): Payer: Self-pay | Admitting: Emergency Medicine

## 2022-06-13 ENCOUNTER — Emergency Department (HOSPITAL_COMMUNITY)
Admission: EM | Admit: 2022-06-13 | Discharge: 2022-06-14 | Disposition: A | Discharge status: Home / Self Care | Payer: Medicaid Other | Attending: Student | Admitting: Student

## 2022-06-13 ENCOUNTER — Other Ambulatory Visit: Payer: Self-pay

## 2022-06-13 ENCOUNTER — Emergency Department (HOSPITAL_COMMUNITY): Payer: Medicaid Other

## 2022-06-13 DIAGNOSIS — R42 Dizziness and giddiness: Secondary | ICD-10-CM | POA: Insufficient documentation

## 2022-06-13 DIAGNOSIS — Z79899 Other long term (current) drug therapy: Secondary | ICD-10-CM | POA: Insufficient documentation

## 2022-06-13 DIAGNOSIS — N9489 Other specified conditions associated with female genital organs and menstrual cycle: Secondary | ICD-10-CM | POA: Diagnosis not present

## 2022-06-13 DIAGNOSIS — R55 Syncope and collapse: Secondary | ICD-10-CM | POA: Diagnosis present

## 2022-06-13 LAB — I-STAT BETA HCG BLOOD, ED (MC, WL, AP ONLY): I-stat hCG, quantitative: <5 m[IU]/mL (ref <5)

## 2022-06-13 LAB — BASIC METABOLIC PANEL
Anion gap: 5 (ref 5–15)
BUN: 16 mg/dL (ref 6–20)
CO2: 26 mmol/L (ref 22–32)
Calcium: 8.6 mg/dL — ABNORMAL LOW (ref 8.9–10.3)
Chloride: 109 mmol/L (ref 98–111)
Creatinine, Ser: 0.84 mg/dL (ref 0.44–1.00)
GFR, Estimated: >60 mL/min (ref >60)
Glucose, Bld: 91 mg/dL (ref 70–99)
Potassium: 3.4 mmol/L — ABNORMAL LOW (ref 3.5–5.1)
Sodium: 140 mmol/L (ref 135–145)

## 2022-06-13 LAB — CBC WITH DIFFERENTIAL/PLATELET
Abs Immature Granulocytes: 0.01 10*3/uL (ref 0.00–0.07)
Basophils Absolute: 0 10*3/uL (ref 0.0–0.1)
Basophils Relative: 0 %
Eosinophils Absolute: 0.2 10*3/uL (ref 0.0–0.5)
Eosinophils Relative: 2 %
HCT: 38.6 % (ref 36.0–46.0)
Hemoglobin: 12.3 g/dL (ref 12.0–15.0)
Immature Granulocytes: 0 %
Lymphocytes Relative: 46 %
Lymphs Abs: 3 10*3/uL (ref 0.7–4.0)
MCH: 26.7 pg (ref 26.0–34.0)
MCHC: 31.9 g/dL (ref 30.0–36.0)
MCV: 83.7 fL (ref 80.0–100.0)
Monocytes Absolute: 0.6 10*3/uL (ref 0.1–1.0)
Monocytes Relative: 9 %
Neutro Abs: 2.8 10*3/uL (ref 1.7–7.7)
Neutrophils Relative %: 43 %
Platelets: 217 10*3/uL (ref 150–400)
RBC: 4.61 MIL/uL (ref 3.87–5.11)
RDW: 15.1 % (ref 11.5–15.5)
WBC: 6.6 10*3/uL (ref 4.0–10.5)
nRBC: 0 % (ref 0.0–0.2)

## 2022-06-13 LAB — CBG MONITORING, ED: Glucose-Capillary: 87 mg/dL (ref 70–99)

## 2022-06-13 NOTE — ED Provider Notes (Signed)
[facility]-EMERGENCY DEPT Provider Note   CSN: [id number] Arrival date & time: 06/13/22  2132     History  Chief Complaint  Patient presents with   Loss of Consciousness    Alison Cummings is a 21 y.o. female who presents with concern for syncopal episode.  Patient presents with her mother at the bedside.  Patient and her mother she is on Lexapro and her dosage was increased from 10 mg to 20 mg with first dose of increased dose yesterday.  Per patient's mother, she was kind of loopy and not behaving normally yesterday, very tired.  Today patient reported feeling exceptionally lightheaded, subsequently syncopized, her mother caught her on her fall down.  Patient was working at [employer] at the time.  States that she has been eating and drinking normally.  According to her mother patient did have reported "miscarriage or uterine abnormality with bleeding" at the end of October and blood from October 24 through 3 days ago.  Has not seen an OB/GYN for this.  Had positive pregnancy test at the time denies any history of pregnancy prior to this.  I personally reviewed this patient's medical records.  Has history of mood disorder, ADHD.  Per mother is on Vraylar Focalin clonidine and Lexapro daily. HPI     Home Medications Prior to Admission medications   Medication Sig Start Date End Date Taking? Authorizing Provider  acetaminophen (TYLENOL) 325 MG tablet Take 2 tablets (650 mg total) by mouth every 6 (six) hours as needed. 08/03/18   Mesner, Barbara Cower, MD  ARIPiprazole (ABILIFY) 5 MG tablet Take 1 tablet (5 mg total) by mouth 2 (two) times daily. 08/03/18   Charm Rings, NP  Cetirizine HCl 10 MG CAPS Take 1 capsule (10 mg total) by mouth daily for 10 days. 04/30/21 05/10/21  Wieters, Hallie C, PA-C  cloNIDine (CATAPRES) 0.3 MG tablet Take by mouth. 03/31/15   [provider]  escitalopram (LEXAPRO) 5 MG tablet Take 5 mg by mouth 2 (two) times daily. 04/25/21    [provider]  FOCALIN XR 40 MG CP24 Take 40 mg by mouth every morning. 11/13/17   [provider]  FOCALIN XR 5 MG 24 hr capsule Take 5 mg by mouth daily. 03/26/21   [provider]  ibuprofen (ADVIL) 600 MG tablet Take 1 tablet (600 mg total) by mouth every 6 (six) hours as needed. 04/30/21   Wieters, Hallie C, PA-C  INVEGA SUSTENNA 156 MG/ML SUSY injection Inject into the muscle every 30 (thirty) days. 03/28/20   [provider]  metroNIDAZOLE (FLAGYL) 500 MG tablet Take 1 tablet (500 mg total) by mouth 2 (two) times daily. One po bid x 7 days 05/24/22   Geoffery Lyons, MD  ondansetron (ZOFRAN ODT) 4 MG disintegrating tablet Take 1 tablet (4 mg total) by mouth every 8 (eight) hours as needed for nausea or vomiting. 04/30/21   Wieters, Hallie C, PA-C  risperiDONE (RISPERDAL) 0.5 MG tablet Take 1 tablet (0.5 mg total) by mouth 2 (two) times daily. 08/03/18   Charm Rings, NP      Allergies    Ibuprofen    Review of Systems   Review of Systems  Constitutional: Negative.   HENT: Negative.    Respiratory: Negative.    Cardiovascular: Negative.   Gastrointestinal:  Positive for abdominal pain.  Neurological:  Positive for syncope and light-headedness.  Psychiatric/Behavioral:  Positive for confusion.     Physical Exam Updated Vital Signs  BP 118/85 (BP Location: Right Arm)   Pulse 90   Temp 97.7 F (36.5 C)   Resp 18   SpO2 98%  Physical Exam Vitals and nursing note reviewed.  Constitutional:      Appearance: She is not ill-appearing or toxic-appearing.  HENT:     Head: Normocephalic and atraumatic.     Mouth/Throat:     Mouth: Mucous membranes are moist.     Pharynx: No oropharyngeal exudate or posterior oropharyngeal erythema.  Eyes:     General:        Right eye: No discharge.        Left eye: No discharge.     Extraocular Movements: Extraocular movements intact.     Conjunctiva/sclera: Conjunctivae normal.     Pupils: Pupils are equal,  round, and reactive to light.  Cardiovascular:     Rate and Rhythm: Normal rate and regular rhythm.     Pulses: Normal pulses.     Heart sounds: Normal heart sounds. No murmur heard. Pulmonary:     Effort: Pulmonary effort is normal. No respiratory distress.     Breath sounds: Normal breath sounds. No wheezing or rales.  Abdominal:     General: Bowel sounds are normal. There is no distension.     Palpations: Abdomen is soft.     Tenderness: There is no abdominal tenderness. There is no right CVA tenderness, left CVA tenderness, guarding or rebound.     Comments: Right sided pelvic TTP.   Musculoskeletal:        General: No deformity.     Cervical back: Neck supple.     Right lower leg: No edema.     Left lower leg: No edema.  Skin:    General: Skin is warm and dry.     Capillary Refill: Capillary refill takes less than 2 seconds.  Neurological:     General: No focal deficit present.     Mental Status: She is alert and oriented to person, place, and time. Mental status is at baseline.     GCS: GCS eye subscore is 4. GCS verbal subscore is 4. GCS motor subscore is 6.     Cranial Nerves: Cranial nerves 2-12 are intact.     Sensory: Sensation is intact.     Motor: Motor function is intact.     Coordination: Coordination is intact.     Comments: Gait deferred.   Patient initially with some delay in answering questions, though giggling throughout interview.    Psychiatric:        Mood and Affect: Mood normal.     ED Results / Procedures / Treatments   Labs (all labs ordered are listed, but only abnormal results are displayed) Labs Reviewed  BASIC METABOLIC PANEL - Abnormal; Notable for the following components:      Result Value   Potassium 3.4 (*)    Calcium 8.6 (*)    All other components within normal limits  CBC WITH DIFFERENTIAL/PLATELET  HEPATIC FUNCTION PANEL  RAPID URINE DRUG SCREEN, HOSP PERFORMED  URINALYSIS, ROUTINE W REFLEX MICROSCOPIC  CBG MONITORING, ED   I-STAT BETA HCG BLOOD, ED ([facility], [facility], [facility] ONLY)    EKG EKG Interpretation  Date/Time:  Thursday June 13 2022 23:42:25 EST Ventricular Rate:  85 PR Interval:  133 QRS Duration: 77 QT Interval:  359 QTC Calculation: 427 R Axis:   53 Text Interpretation: Sinus rhythm Confirmed by Kommor, Madison ([id number]) on 06/14/2022 12:43:41 AM  Radiology DG Chest 2 View  Result Date: 06/13/2022 CLINICAL DATA:  Syncope EXAM: CHEST - 2 VIEW COMPARISON:  None Available. FINDINGS: The heart size and mediastinal contours are within normal limits. Both lungs are clear. The visualized skeletal structures are unremarkable. IMPRESSION: No active cardiopulmonary disease. Electronically Signed   By: Jasmine Pang M.D.   On: 06/13/2022 23:17    Procedures Procedures    Medications Ordered in ED Medications - No data to display  ED Course/ Medical Decision Making/ A&P                           Medical Decision Making 21 year old female presents to the ED with concern for syncope.  Vital signs are normal and intake.  Cardiopulmonary exam is normal, abdomen was benign.  Neurologic exam without focal deficit.  The differential for syncope is extensive and includes, but is not limited to: arrythmia (Vtach, SVT, SSS, sinus arrest, AV block, bradycardia), aortic stenosis, AMI, hypertrophic obstructive cardiomyopathy (HOCM), PE, atrial myxoma, pulmonary hypertension, orthostatic hypotension, hypovolemia, drug effect, GB syndrome, micturition, cough, carotid sinus sensitivity, seizure, TIA/CVA, hypoglycemia, vertigo.   Amount and/or Complexity of Data Reviewed Labs: ordered.    Details: CBC without leukocytosis or anemia, BMP with hypokalemia of 3.4, otherwise unremarkable.  Hepatic function panel is normal, UA is unremarkable.  Patient is not pregnant and UDS is negative.   Radiology: ordered.    Details: Chest x-ray visualized this provider is negative for acute cardiopulmonary disease.  ECG/medicine tests:      Details:  EKG with normal sinus rhythm without interval changes or STEMI.   Overall clinical patient most consistent with drug effect from increase in dosage of Lexapro.  No dangerous dysrhythmia, metabolic derangement, serotonin syndrome identified on work-up today.  Patient is ambulated in the ED, his p.o. challenged well and is very well-appearing at this time, though somnolent.  Easily arousable at the bedside.  No further work-up warranted in the ED this time.  Clinical concern for emergent underlying etiology that warrant further ED work-up or inpatient management is exceedingly low. Briyonna and her mother voiced understanding of her medical evaluation and treatment plan. Each of their questions answered to their expressed satisfaction.  Return precautions were given.  Patient is well-appearing, stable, and was discharged in good condition.  This chart was dictated using voice recognition software, Dragon. Despite the best efforts of this provider to proofread and correct errors, errors may still occur which can change documentation meaning.   Final Clinical Impression(s) / ED Diagnoses Final diagnoses:  Syncope and collapse    Rx / DC Orders ED Discharge Orders     None         Sherrilee Gilles 06/14/22 0220    Glendora Score, MD 06/14/22 [phone]

## 2022-06-13 NOTE — ED Triage Notes (Signed)
BIB EMS for syncopal episode. Pt states her dosage of Lexapro was increased a few days ago from 10 mg to 20 mg. Today was at [facility] and passed out, mother caught her and helped her to the floor. No head injury reported. Pt now c/o feeling light-headed

## 2022-06-14 LAB — RAPID URINE DRUG SCREEN, HOSP PERFORMED
Amphetamines: NOT DETECTED
Barbiturates: NOT DETECTED
Benzodiazepines: NOT DETECTED
Cocaine: NOT DETECTED
Opiates: NOT DETECTED
Tetrahydrocannabinol: NOT DETECTED

## 2022-06-14 LAB — HEPATIC FUNCTION PANEL
ALT: 28 U/L (ref 0–44)
AST: 24 U/L (ref 15–41)
Albumin: 3.5 g/dL (ref 3.5–5.0)
Alkaline Phosphatase: 48 U/L (ref 38–126)
Bilirubin, Direct: <0.1 mg/dL (ref 0.0–0.2)
Total Bilirubin: 0.5 mg/dL (ref 0.3–1.2)
Total Protein: 7 g/dL (ref 6.5–8.1)

## 2022-06-14 LAB — URINALYSIS, ROUTINE W REFLEX MICROSCOPIC
Bilirubin Urine: NEGATIVE
Glucose, UA: NEGATIVE mg/dL
Hgb urine dipstick: NEGATIVE
Ketones, ur: NEGATIVE mg/dL
Leukocytes,Ua: NEGATIVE
Nitrite: NEGATIVE
Protein, ur: NEGATIVE mg/dL
Specific Gravity, Urine: 1.023 (ref 1.005–1.030)
pH: 6 (ref 5.0–8.0)

## 2022-06-14 NOTE — ED Notes (Signed)
Pt ambulatory to restroom without issue, pt able to keep down PO fluids without issue.

## 2022-06-14 NOTE — Discharge Instructions (Signed)
Alison Cummings was seen in the ER today for her fainting spell. Her workup was very reassuring. Please return to her previous lower dose of lexapro, and follow up closely with her PCP and behavioral health teams. Increase hydration over the next few days and return to the ER with any new severe symptoms.

## 2022-06-14 NOTE — ED Notes (Signed)
Pt ambulates with steady and equal gait.

## 2022-07-26 ENCOUNTER — Other Ambulatory Visit: Payer: Self-pay

## 2022-07-26 ENCOUNTER — Inpatient Hospital Stay (HOSPITAL_COMMUNITY)
Admission: AD | Admit: 2022-07-26 | Discharge: 2022-07-26 | Disposition: A | Discharge status: Home / Self Care | Payer: Medicaid Other | Attending: Obstetrics and Gynecology | Admitting: Obstetrics and Gynecology

## 2022-07-26 ENCOUNTER — Inpatient Hospital Stay (HOSPITAL_COMMUNITY)
Admission: AD | Admit: 2022-07-26 | Discharge: 2022-07-27 | Disposition: A | Discharge status: Home / Self Care | Payer: Medicaid Other | Attending: Obstetrics and Gynecology | Admitting: Obstetrics and Gynecology

## 2022-07-26 DIAGNOSIS — Z3202 Encounter for pregnancy test, result negative: Secondary | ICD-10-CM | POA: Insufficient documentation

## 2022-07-26 DIAGNOSIS — N939 Abnormal uterine and vaginal bleeding, unspecified: Secondary | ICD-10-CM | POA: Diagnosis present

## 2022-07-26 DIAGNOSIS — F902 Attention-deficit hyperactivity disorder, combined type: Secondary | ICD-10-CM

## 2022-07-26 LAB — HCG, QUANTITATIVE, PREGNANCY: hCG, Beta Chain, Quant, S: <1 m[IU]/mL (ref <5)

## 2022-07-26 LAB — POCT PREGNANCY, URINE: Preg Test, Ur: NEGATIVE

## 2022-07-26 NOTE — MAU Note (Signed)
Alison Cummings is a 21 y.o. at Unknown here in MAU reporting: she began having VB with small clots this evening.  States VB is decreased now.  Reports intermittent abdominal cramping.  Reports +HPT on 06/26/2022. LMP: 05/30/2022 Onset of complaint: today Pain score: 7 Vitals:   07/26/22 1848  BP: 108/70  Pulse: 85  Resp: 18  Temp: 98.4 F (36.9 C)  SpO2: 100%     FHT:NA Lab orders placed from triage:   UPT

## 2022-07-26 NOTE — Discharge Instructions (Signed)
Prenatal Care Providers           [facility]  [address] [phone]  [facility]   [address]  [phone]  [facility] @ [place] [facility]       [address] [phone]            [facility] @ [place]     [address] [phone]          [facility] @ [place]   [address] [phone]  [facility]  [address] [phone]     [facility])  [address]   [phone]     [facility]  Phone: [phone]  [facility]  Phone: [phone]  [facility] Phone: [phone]  [facility] Phone: [phone]  [facility] Phone: [phone]  [facility] Phone: [phone]  [facility]  Phone: [phone]  

## 2022-07-26 NOTE — Progress Notes (Signed)
Alison Cummings, CNM into see pt, evaluate status and discuss POC.

## 2022-07-26 NOTE — MAU Provider Note (Signed)
Event Date/Time   First Provider Initiated Contact with Patient 07/26/22 1858      S [name] is a 21 y.o. female patient who presents to MAU via EMS with chief complaint of heavy vaginal bleeding and concern for miscarriage based on recent positive home pregnancy test.  Patient reports miscarriage 05/28/2022. She identifies LMP of 05/30/2022. She did not have a period in the month of November. She reports one episode of sexual intercourse on 07/20/2022. She does not use contraception.   O BP 108/70 (BP Location: Right Arm)   Pulse 85   Temp 98.4 F (36.9 C) (Oral)   Resp 18   Ht 5\' 2"  (1.575 m)   Wt 110.2 kg   LMP 05/30/2022   SpO2 100%   BMI 44.45 kg/m   Physical Exam Vitals and nursing note reviewed. Exam conducted with a chaperone present.  Constitutional:      Appearance: Normal appearance. She is obese. She is not ill-appearing.  Cardiovascular:     Rate and Rhythm: Normal rate.     Pulses: Normal pulses.     Heart sounds: Normal heart sounds.  Pulmonary:     Effort: Pulmonary effort is normal.     Breath sounds: Normal breath sounds.  Skin:    Capillary Refill: Capillary refill takes less than 2 seconds.  Neurological:     Mental Status: She is alert and oriented to person, place, and time.  Psychiatric:        Mood and Affect: Mood normal.        Behavior: Behavior normal.        Thought Content: Thought content normal.        Judgment: Judgment normal.    A --Medical screening exam complete --EMR reviewed --Patient reports miscarriage 05/28/2022 two days before stated LMP 05/30/2022. --Discussed with her that this is unlikely to be a true LMP due to physiology of miscarriage  --Negative Quant hCG 06/13/2022 --One episode of intercourse 07/20/2022 --Offered Quant hCG s/p negative UPT in MAU per policy  Results for orders placed or performed during the hospital encounter of 07/26/22 (from the past 24 hour(s))  Pregnancy, urine POC      Status: None   Collection Time: 07/26/22  6:50 PM  Result Value Ref Range   Preg Test, Ur NEGATIVE NEGATIVE  hCG, quantitative, pregnancy     Status: None   Collection Time: 07/26/22  7:11 PM  Result Value Ref Range   hCG, Beta Chain, Quant, S <1 <5 mIU/mL     P Discharge home in stable condition  F/U: -Patient encouraged to review options for contraception -Encouraged STD testing q six months -Given reference list for GYN Providers surrounding [facility] to facilitate establishing care  07/28/22 07/26/2022 8:31 PM

## 2022-10-29 DIAGNOSIS — F900 Attention-deficit hyperactivity disorder, predominantly inattentive type: Secondary | ICD-10-CM | POA: Diagnosis not present

## 2022-10-29 DIAGNOSIS — Z5181 Encounter for therapeutic drug level monitoring: Secondary | ICD-10-CM | POA: Diagnosis not present

## 2022-10-29 DIAGNOSIS — F250 Schizoaffective disorder, bipolar type: Secondary | ICD-10-CM | POA: Diagnosis not present

## 2022-11-26 DIAGNOSIS — F3131 Bipolar disorder, current episode depressed, mild: Secondary | ICD-10-CM | POA: Diagnosis not present

## 2022-11-26 DIAGNOSIS — F900 Attention-deficit hyperactivity disorder, predominantly inattentive type: Secondary | ICD-10-CM | POA: Diagnosis not present

## 2022-11-26 DIAGNOSIS — Z5181 Encounter for therapeutic drug level monitoring: Secondary | ICD-10-CM | POA: Diagnosis not present

## 2022-11-26 DIAGNOSIS — F411 Generalized anxiety disorder: Secondary | ICD-10-CM | POA: Diagnosis not present

## 2023-04-10 ENCOUNTER — Ambulatory Visit (HOSPITAL_COMMUNITY)
Admission: EM | Admit: 2023-04-10 | Discharge: 2023-04-10 | Disposition: A | Discharge status: Other Acute Inpatient Hospital | Payer: 59 | Attending: Family | Admitting: Family

## 2023-04-10 ENCOUNTER — Other Ambulatory Visit: Payer: Self-pay

## 2023-04-10 ENCOUNTER — Emergency Department (HOSPITAL_COMMUNITY)
Admission: EM | Admit: 2023-04-10 | Discharge: 2023-04-11 | Disposition: A | Discharge status: Other Acute Inpatient Hospital | Payer: 59 | Attending: Emergency Medicine | Admitting: Emergency Medicine

## 2023-04-10 DIAGNOSIS — F909 Attention-deficit hyperactivity disorder, unspecified type: Secondary | ICD-10-CM | POA: Insufficient documentation

## 2023-04-10 DIAGNOSIS — F4325 Adjustment disorder with mixed disturbance of emotions and conduct: Secondary | ICD-10-CM | POA: Diagnosis not present

## 2023-04-10 DIAGNOSIS — F259 Schizoaffective disorder, unspecified: Secondary | ICD-10-CM | POA: Insufficient documentation

## 2023-04-10 DIAGNOSIS — R000 Tachycardia, unspecified: Secondary | ICD-10-CM

## 2023-04-10 DIAGNOSIS — Z79899 Other long term (current) drug therapy: Secondary | ICD-10-CM | POA: Insufficient documentation

## 2023-04-10 DIAGNOSIS — F6381 Intermittent explosive disorder: Secondary | ICD-10-CM | POA: Diagnosis not present

## 2023-04-10 DIAGNOSIS — F32A Depression, unspecified: Secondary | ICD-10-CM | POA: Diagnosis not present

## 2023-04-10 DIAGNOSIS — R079 Chest pain, unspecified: Secondary | ICD-10-CM | POA: Diagnosis not present

## 2023-04-10 DIAGNOSIS — E0590 Thyrotoxicosis, unspecified without thyrotoxic crisis or storm: Secondary | ICD-10-CM | POA: Insufficient documentation

## 2023-04-10 DIAGNOSIS — R002 Palpitations: Secondary | ICD-10-CM | POA: Diagnosis not present

## 2023-04-10 DIAGNOSIS — R0789 Other chest pain: Secondary | ICD-10-CM | POA: Diagnosis not present

## 2023-04-10 LAB — CBC WITH DIFFERENTIAL/PLATELET
Abs Immature Granulocytes: 0 10*3/uL (ref 0.00–0.07)
Abs Immature Granulocytes: 0 10*3/uL (ref 0.00–0.07)
Basophils Absolute: 0 10*3/uL (ref 0.0–0.1)
Basophils Absolute: 0 10*3/uL (ref 0.0–0.1)
Basophils Relative: 0 %
Basophils Relative: 0 %
Eosinophils Absolute: 0.2 10*3/uL (ref 0.0–0.5)
Eosinophils Absolute: 0.2 10*3/uL (ref 0.0–0.5)
Eosinophils Relative: 3 %
Eosinophils Relative: 4 %
HCT: 35.1 % — ABNORMAL LOW (ref 36.0–46.0)
HCT: 34.9 % — ABNORMAL LOW (ref 36.0–46.0)
Hemoglobin: 11.3 g/dL — ABNORMAL LOW (ref 12.0–15.0)
Hemoglobin: 11 g/dL — ABNORMAL LOW (ref 12.0–15.0)
Immature Granulocytes: 0 %
Immature Granulocytes: 0 %
Lymphocytes Relative: 53 %
Lymphocytes Relative: 53 %
Lymphs Abs: 2.8 10*3/uL (ref 0.7–4.0)
Lymphs Abs: 2.8 10*3/uL (ref 0.7–4.0)
MCH: 24.6 pg — ABNORMAL LOW (ref 26.0–34.0)
MCH: 24 pg — ABNORMAL LOW (ref 26.0–34.0)
MCHC: 32.2 g/dL (ref 30.0–36.0)
MCHC: 31.5 g/dL (ref 30.0–36.0)
MCV: 76.3 fL — ABNORMAL LOW (ref 80.0–100.0)
MCV: 76.2 fL — ABNORMAL LOW (ref 80.0–100.0)
Monocytes Absolute: 0.8 10*3/uL (ref 0.1–1.0)
Monocytes Absolute: 0.7 10*3/uL (ref 0.1–1.0)
Monocytes Relative: 15 %
Monocytes Relative: 14 %
Neutro Abs: 1.5 10*3/uL — ABNORMAL LOW (ref 1.7–7.7)
Neutro Abs: 1.5 10*3/uL — ABNORMAL LOW (ref 1.7–7.7)
Neutrophils Relative %: 29 %
Neutrophils Relative %: 29 %
Platelets: 216 10*3/uL (ref 150–400)
Platelets: 203 10*3/uL (ref 150–400)
RBC: 4.6 MIL/uL (ref 3.87–5.11)
RBC: 4.58 MIL/uL (ref 3.87–5.11)
RDW: 14.1 % (ref 11.5–15.5)
RDW: 14.1 % (ref 11.5–15.5)
WBC: 5.3 10*3/uL (ref 4.0–10.5)
WBC: 5.3 10*3/uL (ref 4.0–10.5)
nRBC: 0 % (ref 0.0–0.2)
nRBC: 0 % (ref 0.0–0.2)

## 2023-04-10 LAB — POCT URINE DRUG SCREEN - MANUAL ENTRY (I-SCREEN)
POC Amphetamine UR: NOT DETECTED
POC Buprenorphine (BUP): NOT DETECTED
POC Cocaine UR: NOT DETECTED
POC Marijuana UR: NOT DETECTED
POC Methadone UR: NOT DETECTED
POC Methamphetamine UR: NOT DETECTED
POC Morphine: NOT DETECTED
POC Oxazepam (BZO): NOT DETECTED
POC Oxycodone UR: NOT DETECTED
POC Secobarbital (BAR): NOT DETECTED

## 2023-04-10 LAB — TROPONIN I (HIGH SENSITIVITY)
Troponin I (High Sensitivity): 3 ng/L (ref <18)
Troponin I (High Sensitivity): 4 ng/L (ref <18)

## 2023-04-10 LAB — COMPREHENSIVE METABOLIC PANEL
ALT: 59 U/L — ABNORMAL HIGH (ref 0–44)
ALT: 51 U/L — ABNORMAL HIGH (ref 0–44)
AST: 40 U/L (ref 15–41)
AST: 37 U/L (ref 15–41)
Albumin: 3.3 g/dL — ABNORMAL LOW (ref 3.5–5.0)
Albumin: 3 g/dL — ABNORMAL LOW (ref 3.5–5.0)
Alkaline Phosphatase: 79 U/L (ref 38–126)
Alkaline Phosphatase: 68 U/L (ref 38–126)
Anion gap: 9 (ref 5–15)
Anion gap: 8 (ref 5–15)
BUN: 7 mg/dL (ref 6–20)
BUN: 8 mg/dL (ref 6–20)
CO2: 25 mmol/L (ref 22–32)
CO2: 24 mmol/L (ref 22–32)
Calcium: 9.2 mg/dL (ref 8.9–10.3)
Calcium: 8.8 mg/dL — ABNORMAL LOW (ref 8.9–10.3)
Chloride: 103 mmol/L (ref 98–111)
Chloride: 106 mmol/L (ref 98–111)
Creatinine, Ser: 0.64 mg/dL (ref 0.44–1.00)
Creatinine, Ser: 0.7 mg/dL (ref 0.44–1.00)
GFR, Estimated: >60 mL/min (ref >60)
GFR, Estimated: >60 mL/min (ref >60)
Glucose, Bld: 84 mg/dL (ref 70–99)
Glucose, Bld: 109 mg/dL — ABNORMAL HIGH (ref 70–99)
Potassium: 4 mmol/L (ref 3.5–5.1)
Potassium: 3.8 mmol/L (ref 3.5–5.1)
Sodium: 137 mmol/L (ref 135–145)
Sodium: 138 mmol/L (ref 135–145)
Total Bilirubin: 1 mg/dL (ref 0.3–1.2)
Total Bilirubin: 0.5 mg/dL (ref 0.3–1.2)
Total Protein: 6.5 g/dL (ref 6.5–8.1)
Total Protein: 6.1 g/dL — ABNORMAL LOW (ref 6.5–8.1)

## 2023-04-10 LAB — TSH: TSH: <0.01 u[IU]/mL — ABNORMAL LOW (ref 0.350–4.500)

## 2023-04-10 LAB — D-DIMER, QUANTITATIVE: D-Dimer, Quant: 0.48 ug{FEU}/mL (ref 0.00–0.50)

## 2023-04-10 LAB — MAGNESIUM: Magnesium: 1.8 mg/dL (ref 1.7–2.4)

## 2023-04-10 LAB — T4, FREE: Free T4: 4.04 ng/dL — ABNORMAL HIGH (ref 0.61–1.12)

## 2023-04-10 LAB — POCT PREGNANCY, URINE: Preg Test, Ur: NEGATIVE

## 2023-04-10 LAB — ETHANOL: Alcohol, Ethyl (B): <10 mg/dL (ref <10)

## 2023-04-10 MED ORDER — CARIPRAZINE HCL 1.5 MG PO CAPS
1.5000 mg | ORAL_CAPSULE | Freq: Every day | ORAL | Status: DC
  Administered 2023-04-10: 1.5 mg via ORAL
  Filled 2023-04-10: qty 1

## 2023-04-10 MED ORDER — ALUM & MAG HYDROXIDE-SIMETH 200-200-20 MG/5ML PO SUSP: 30.0000 mL | ORAL | Status: DC | PRN

## 2023-04-10 MED ORDER — METOPROLOL TARTRATE 5 MG/5ML IV SOLN
5.0000 mg | Freq: Once | INTRAVENOUS | Status: AC
  Administered 2023-04-10: 5 mg via INTRAVENOUS
  Filled 2023-04-10: qty 5

## 2023-04-10 MED ORDER — HYDROXYZINE HCL 25 MG PO TABS: 25.0000 mg | ORAL_TABLET | Freq: Three times a day (TID) | ORAL | Status: DC | PRN

## 2023-04-10 MED ORDER — TRAZODONE HCL 50 MG PO TABS: 50.0000 mg | ORAL_TABLET | Freq: Every evening | ORAL | Status: DC | PRN

## 2023-04-10 MED ORDER — SODIUM CHLORIDE 0.9 % IV BOLUS
1000.0000 mL | Freq: Once | INTRAVENOUS | Status: AC
  Administered 2023-04-10: 1000 mL via INTRAVENOUS

## 2023-04-10 MED ORDER — METOPROLOL TARTRATE 25 MG PO TABS: 50.0000 mg | ORAL_TABLET | Freq: Two times a day (BID) | ORAL | Status: DC

## 2023-04-10 MED ORDER — ESCITALOPRAM OXALATE 10 MG PO TABS
10.0000 mg | ORAL_TABLET | Freq: Every day | ORAL | Status: DC
  Administered 2023-04-10: 10 mg via ORAL
  Filled 2023-04-10: qty 1

## 2023-04-10 MED ORDER — METOPROLOL TARTRATE 50 MG PO TABS: 50.0000 mg | ORAL_TABLET | Freq: Two times a day (BID) | ORAL | Status: DC

## 2023-04-10 MED ORDER — METOPROLOL TARTRATE 25 MG PO TABS
25.0000 mg | ORAL_TABLET | Freq: Two times a day (BID) | ORAL | Status: DC
  Administered 2023-04-10: 25 mg via ORAL
  Filled 2023-04-10: qty 1

## 2023-04-10 MED ORDER — MAGNESIUM HYDROXIDE 400 MG/5ML PO SUSP: 30.0000 mL | Freq: Every day | ORAL | Status: DC | PRN

## 2023-04-10 MED ORDER — CLONIDINE HCL 0.1 MG PO TABS: 0.1000 mg | ORAL_TABLET | Freq: Every day | ORAL | Status: DC

## 2023-04-10 MED ORDER — ACETAMINOPHEN 325 MG PO TABS: 650.0000 mg | ORAL_TABLET | Freq: Four times a day (QID) | ORAL | Status: DC | PRN

## 2023-04-10 NOTE — ED Notes (Signed)
Attempted to call report into [facility] @ 1859, no answer. EMS called. Will continue to monitor and report any COC.

## 2023-04-10 NOTE — ED Triage Notes (Signed)
EMS report: Pt arrives from [facility] via [facility] with c/o right sided chest pain going on and off for about 2 weeks, En route, ST 125,  A/O. 136/82. Cbg 138. Recent change to meds, Metoprolol.

## 2023-04-10 NOTE — ED Provider Notes (Signed)
Patient presents with tachycardia,sustained this shift,  heart rate measures 123 currently.   TSH level decreased, measures 0.010 on 04/10/2023.  Patient currently endorses shortness of breath and chest pain.  Shortness of breath new, chest pain ongoing for several weeks.  Patient reports she had an outpatient follow-up appointment with primary care scheduled for today however presented to [facility] behavioral health this morning prior to attending outpatient appointment. Patient discussed with Dr. Tula Nakayama [facility] emergency department provider.  Potential diagnoses include uncontrolled hypothyroidism.  Reviewed treatment plan, as patient currently reporting symptoms of chest pain and shortness of breath she will be transported to [facility] emergency department for medical clearance. Treatment plan reviewed with attending psychiatrist, Dr. Nelly Rout.  Patient accepted to [facility] emergency department by Dr. Silverio Lay.  She will travel via EMS.  Patient remains voluntary.  She is appropriate to return to [facility] behavioral health urgent care continuous observation once medically cleared.

## 2023-04-10 NOTE — ED Notes (Signed)
Patient's HR elevated. Provider made aware. Endorses chest pain 7/10 and shortness of breath 8/10. States shortness of breath is worse with ambulation. States she had a doctor's appointment scheduled for today for symptoms of chest pain and sob. Per provider will send patient to the ED for work-up.
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
## 2023-04-10 NOTE — ED Notes (Signed)
Patient Alert and Oriented X 3. She denies SI/HI or AVH Patient contracted for safety. Is currently calm and cooperative. Safety of environment ensured. Skin check conducted by this, RN and Jacquline, MHT. Patient belongings placed in locker 9 and 10. Includes garbage bags filled with clothes. Will continue to monitor for safety.
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
## 2023-04-10 NOTE — ED Notes (Signed)
Called to give report to ED Nurse but no answer.

## 2023-04-10 NOTE — ED Notes (Signed)
Patient observed watching television. Denies any needs at this time. Will continue to monitor for safety.
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
## 2023-04-10 NOTE — ED Notes (Signed)
 Patient was provided dinner

## 2023-04-10 NOTE — ED Provider Notes (Signed)
[facility] Continuous Assessment Admission H&P  Date: 04/10/23 Patient Name: Alison Cummings MRN: [id number] Chief Complaint:   Diagnoses:  Final diagnoses:  Adjustment disorder with mixed disturbance of emotions and conduct    HPI: Patient presents voluntarily to [facility] behavioral health, transported by Patent examiner.  Patient's sister contacted law enforcement this morning, asked patient to leave her home and made patient aware she is not welcome to return to sister's home.  Patient is assessed by this nurse practitioner face-to-face.  She is seated in assessment area, no apparent distress.  She is alert and oriented, pleasant and cooperative during assessment.  She presents with depressed mood, congruent affect.  Patient reviewed with Dr. Nelly Rout on 04/10/2023.  Alison Cummings reports recent stressors include ongoing verbal conflict with her sister.  Patient reports her sister argues with her frequently about inconsequential concerns including dirty dishes and videogame controllers.   Patient reports she has resided with her sister for 3 weeks after being asked to leave the home of her biological mother 3 weeks ago.  Patient reports mother has diagnosis of schizophrenia and was "having an episode." On last night patient reports she locked the door to her bedroom because she was "trying to prevent a situation (with sister), I am trying to lay low waiting for job [employer] they are supposed to call me in a week, I am not trying to cause any problems."   Alison Cummings reports diagnosis of schizoaffective disorder and ADHD.  Per chart review additional diagnoses include intermittent explosive disorder and adjustment disorder.  Patient ran out of her medications approximately 2 months ago.  She is scheduled to follow-up with previous outpatient psychiatry provider at [facility], Dr. Lambert Mody, on 04/21/2023.  She is able to recall previous medications including clonidine 0.3 mg nightly,  Lexapro and Vraylar.  She was also prescribed a medication to address ADHD, unable to recall specific medication.  She denies history of inpatient psychiatric hospitalization.  Family mental health history includes patient's mother and father both diagnosed with schizophrenia and bipolar disorder.  Patient denies suicidal and homicidal ideations.  She denies history of suicide attempts, denies history of nonsuicidal self-harm behavior.  She denies auditory and visual hallucinations.  There is no evidence of delusional thought content no indication that patient is responding to internal stimuli.  Alison Cummings would like to be restarted on medications.  She agrees with plan for admission to continuous observation unit at [facility].  Reviewed medications and discussed potential side effects, patient offered opportunity to ask questions. Medication will be restarted at starting dose related to extended time without medications.  Alison Cummings most recently resided with her sister, reports inability to return.  She denies access to weapons.  She would like to be admitted to a homeless shelter if possible.  She is not currently employed.  She denies alcohol and substance use.  She endorses average sleep and appetite.  Patient offered support and encouragement.  She gives verbal consent to speak with her biological mother, Alison Cummings phone number [phone].  Biological mother's phone currently not accepting calls. Patient gives verbal consent to speak with her adoptive mother, Alison Cummings phone number [phone].  Per adoptive mother, Alison Cummings, patient has exhibited aggressive behaviors toward her biological mother and is not permitted to return to biological mother's home.  Patient not permitted to return to home of adoptive mother at this time related to adoptive mother caring for several foster children and does not have space currently.  Total Time spent with patient: 1  hour  Musculoskeletal  Strength & Muscle Tone: within normal limits Gait & Station: normal Patient leans: N/A  Psychiatric Specialty Exam  Presentation General Appearance:  Appropriate for Environment; Casual  Eye Contact: Good  Speech: Clear and Coherent; Normal Rate  Speech Volume: Normal  Handedness: Right   Mood and Affect  Mood: Depressed  Affect: Congruent; Depressed   Thought Process  Thought Processes: Coherent; Goal Directed; Linear  Descriptions of Associations:Intact  Orientation:Full (Time, Place and Person)  Thought Content:Logical; WDL  Diagnosis of Schizophrenia or Schizoaffective disorder in past: Yes   Hallucinations:Hallucinations: None  Ideas of Reference:None  Suicidal Thoughts:Suicidal Thoughts: No  Homicidal Thoughts:Homicidal Thoughts: No   Sensorium  Memory: Immediate Good; Recent Good  Judgment: Intact  Insight: Fair   Art therapist  Concentration: Good  Attention Span: Good  Recall: Good  Fund of Knowledge: Fair  Language: Fair   Psychomotor Activity  Psychomotor Activity: Psychomotor Activity: Normal   Assets  Assets: Desire for Improvement; Financial Resources/Insurance; Manufacturing systems engineer; Physical Health; Resilience; Social Support   Sleep  Sleep: Sleep: Good   Nutritional Assessment (For [facility] and [facility] admissions only) Has the patient had a weight loss or gain of 10 pounds or more in the last 3 months?: No Has the patient had a decrease in food intake/or appetite?: No Does the patient have dental problems?: No Does the patient have eating habits or behaviors that may be indicators of an eating disorder including binging or inducing vomiting?: No Has the patient recently lost weight without trying?: 0 Has the patient been eating poorly because of a decreased appetite?: 0 Malnutrition Screening Tool Score: 0    Physical Exam Vitals and nursing note reviewed.  Constitutional:       Appearance: Normal appearance. She is well-developed.  HENT:     Head: Normocephalic and atraumatic.     Nose: Nose normal.  Cardiovascular:     Rate and Rhythm: Normal rate.  Pulmonary:     Effort: Pulmonary effort is normal.  Musculoskeletal:        General: Normal range of motion.     Cervical back: Normal range of motion.  Skin:    General: Skin is warm and dry.  Neurological:     Mental Status: She is alert and oriented to person, place, and time.  Psychiatric:        Attention and Perception: Attention and perception normal.        Mood and Affect: Affect normal. Mood is depressed.        Speech: Speech normal.        Behavior: Behavior normal. Behavior is cooperative.        Thought Content: Thought content normal.        Cognition and Memory: Cognition and memory normal.        Judgment: Judgment normal.    Review of Systems  Constitutional: Negative.   HENT: Negative.    Eyes: Negative.   Respiratory: Negative.    Cardiovascular: Negative.   Gastrointestinal: Negative.   Genitourinary: Negative.   Musculoskeletal: Negative.   Skin: Negative.   Neurological: Negative.   Psychiatric/Behavioral:  Positive for depression.     Blood pressure 118/71, pulse (!) 112, temperature 99 F (37.2 C), temperature source Oral, resp. rate 20, SpO2 98%. There is no height or weight on file to calculate BMI.  Past Psychiatric History: see above  Is the patient at risk to self? No  Has the  patient been a risk to self in the past 6 months? No .    Has the patient been a risk to self within the distant past? No   Is the patient a risk to others? No   Has the patient been a risk to others in the past 6 months? No   Has the patient been a risk to others within the distant past? No   Past Medical History: Seasonal allergies  Family History: biological mother- schizophrenia  Social History: resides with sister, not employed, denies alcohol and substances use  Last Labs:   Admission on 04/10/2023  Component Date Value Ref Range Status   Preg Test, Ur 04/10/2023 NEGATIVE  NEGATIVE Final   Comment:        THE SENSITIVITY OF THIS METHODOLOGY IS >24 mIU/mL     Allergies: Ibuprofen  Medications:  Facility Ordered Medications  Medication   acetaminophen (TYLENOL) tablet 650 mg   alum & mag hydroxide-simeth (MAALOX/MYLANTA) 200-200-20 MG/5ML suspension 30 mL   magnesium hydroxide (MILK OF MAGNESIA) suspension 30 mL   hydrOXYzine (ATARAX) tablet 25 mg   traZODone (DESYREL) tablet 50 mg   PTA Medications  Medication Sig   ARIPiprazole (ABILIFY) 5 MG tablet Take 1 tablet (5 mg total) by mouth 2 (two) times daily.   risperiDONE (RISPERDAL) 0.5 MG tablet Take 1 tablet (0.5 mg total) by mouth 2 (two) times daily.   INVEGA SUSTENNA 156 MG/ML SUSY injection Inject into the muscle every 30 (thirty) days.   escitalopram (LEXAPRO) 5 MG tablet Take 5 mg by mouth 2 (two) times daily.   cloNIDine (CATAPRES) 0.3 MG tablet Take by mouth.   Cetirizine HCl 10 MG CAPS Take 1 capsule (10 mg total) by mouth daily for 10 days.   ibuprofen (ADVIL) 600 MG tablet Take 1 tablet (600 mg total) by mouth every 6 (six) hours as needed.      Medical Decision Making  Patient remains voluntary. She will be admitted to [facility] for treatment and stabilization. She will be reassessed on 04/11/2023, disposition will be determined at that time.   Laboratory studies ordered including CBC, CMP, ethanol, magnesium, prolactin and TSH.  Urine pregnancy, urine drug screen ordered.  EKG order initiated.  Current medications: -Acetaminophen 650 mg every 6 as needed/mild pain -Maalox 30 mL oral every 4 as needed/digestion -Hydroxyzine 25 mg 3 times daily as needed/anxiety -Magnesium hydroxide 30 mL daily as needed/mild constipation -Trazodone 50 mg nightly as needed/sleep  Prior to admission medications restarted at initial dose: -Cariprazine 1.5 mg  daily -Clonidine 0.1 mg nightly -Escitalopram 10 mg daily    Recommendations  Based on my evaluation the patient does not appear to have an emergency medical condition.  Lenard Lance, FNP 04/10/23  10:11 AM

## 2023-04-10 NOTE — ED Notes (Signed)
Patient was provided lunch.

## 2023-04-11 ENCOUNTER — Ambulatory Visit (HOSPITAL_COMMUNITY)
Admission: EM | Admit: 2023-04-11 | Discharge: 2023-04-14 | Disposition: A | Discharge status: Other Acute Inpatient Hospital | Payer: 59 | Attending: Nurse Practitioner | Admitting: Nurse Practitioner

## 2023-04-11 DIAGNOSIS — F4320 Adjustment disorder, unspecified: Secondary | ICD-10-CM | POA: Insufficient documentation

## 2023-04-11 DIAGNOSIS — Z638 Other specified problems related to primary support group: Secondary | ICD-10-CM | POA: Insufficient documentation

## 2023-04-11 DIAGNOSIS — F4329 Adjustment disorder with other symptoms: Secondary | ICD-10-CM | POA: Diagnosis not present

## 2023-04-11 DIAGNOSIS — F439 Reaction to severe stress, unspecified: Secondary | ICD-10-CM | POA: Insufficient documentation

## 2023-04-11 DIAGNOSIS — E0590 Thyrotoxicosis, unspecified without thyrotoxic crisis or storm: Secondary | ICD-10-CM | POA: Diagnosis not present

## 2023-04-11 LAB — GLUCOSE, CAPILLARY: Glucose-Capillary: 86 mg/dL (ref 70–99)

## 2023-04-11 MED ORDER — METOPROLOL TARTRATE 50 MG PO TABS
50.0000 mg | ORAL_TABLET | Freq: Two times a day (BID) | ORAL | Status: DC
  Administered 2023-04-12 – 2023-04-14 (×5): 50 mg via ORAL
  Filled 2023-04-11: qty 1
  Filled 2023-04-11: qty 6
  Filled 2023-04-11 (×4): qty 1

## 2023-04-11 MED ORDER — MAGNESIUM HYDROXIDE 400 MG/5ML PO SUSP: 30.0000 mL | Freq: Every day | ORAL | Status: DC | PRN

## 2023-04-11 MED ORDER — ACETAMINOPHEN 325 MG PO TABS: 650.0000 mg | ORAL_TABLET | Freq: Four times a day (QID) | ORAL | Status: DC | PRN

## 2023-04-11 MED ORDER — ESCITALOPRAM OXALATE 10 MG PO TABS
10.0000 mg | ORAL_TABLET | Freq: Every day | ORAL | Status: DC
  Administered 2023-04-11 – 2023-04-14 (×4): 10 mg via ORAL
  Filled 2023-04-11 (×2): qty 1
  Filled 2023-04-11: qty 3
  Filled 2023-04-11 (×2): qty 1

## 2023-04-11 MED ORDER — METOPROLOL TARTRATE 25 MG PO TABS
25.0000 mg | ORAL_TABLET | Freq: Once | ORAL | Status: AC
  Administered 2023-04-11: 25 mg via ORAL
  Filled 2023-04-11: qty 1

## 2023-04-11 MED ORDER — ALUM & MAG HYDROXIDE-SIMETH 200-200-20 MG/5ML PO SUSP: 30.0000 mL | ORAL | Status: DC | PRN

## 2023-04-11 MED ORDER — HYDROXYZINE HCL 25 MG PO TABS
25.0000 mg | ORAL_TABLET | Freq: Three times a day (TID) | ORAL | Status: DC | PRN
  Administered 2023-04-12: 25 mg via ORAL
  Filled 2023-04-11: qty 1

## 2023-04-11 MED ORDER — METOPROLOL TARTRATE 25 MG PO TABS
25.0000 mg | ORAL_TABLET | Freq: Two times a day (BID) | ORAL | Status: DC
  Administered 2023-04-11: 25 mg via ORAL
  Filled 2023-04-11: qty 1

## 2023-04-11 MED ORDER — DEXMETHYLPHENIDATE HCL ER 5 MG PO CP24
10.0000 mg | ORAL_CAPSULE | Freq: Every morning | ORAL | Status: DC
  Administered 2023-04-11 – 2023-04-14 (×4): 10 mg via ORAL
  Filled 2023-04-11 (×4): qty 2

## 2023-04-11 MED ORDER — CARIPRAZINE HCL 1.5 MG PO CAPS
1.5000 mg | ORAL_CAPSULE | Freq: Every day | ORAL | Status: DC
  Administered 2023-04-12 – 2023-04-14 (×3): 1.5 mg via ORAL
  Filled 2023-04-11 (×3): qty 1
  Filled 2023-04-11: qty 3

## 2023-04-11 NOTE — ED Provider Notes (Signed)
[facility] Continuous Assessment Admission H&P  Date: 04/11/23 Patient Name: Alison Cummings MRN: [id number] Chief Complaint: "Me and my sister got into it"  Diagnoses:  Final diagnoses:  Stress and adjustment reaction  Family discord    HPI: Alison Cummings is a 22 year old female with psychiatric history of schizoaffective disorder, Bipolar disorder, ADHD and adjustment disorder, who initially presented to [facility] 04/10/23 via [facility] after a verbal altercation with her sister, whom she is currently residing with.  Patient was seen face to face by this provider and chart reviewed  Patient was evaluated by provider and recommended for admission to continuous obs for stabilization and treatment.  However, later in the day, patient noted to have sustained tachycardia at 123 with decreased TSH levels, with new onset SOB, and ongoing chest pain for several weeks.  Patient was transferred to ED for medical clearance, where she was stabilized and returned to [facility] as a direct transfer to continue her psychiatric care.  Per EDP, patient is also recommended continuation of 25 mg metoprolol twice daily for tachycardia and Elevated T4 levels. Will order.   On evaluation, patient is alert, oriented x 4, and cooperative. Speech is clear, normal rate and coherent. Pt appears casually dressed. Eye contact is good. Mood is anxious, affect is congruent with mood. Thought process is coherent and thought content is WDL. Pt denies SI/HI/AVH. There is no objective indication that the patient is responding to internal stimuli. No delusions elicited during this assessment.    Patient reports " I went to the hospital because they were saying my pulse and BP was too high and they called paramedics and at the hospital they started me on the medication that starts with an "M" to help lower my pulse".   On her initial presentation to the [facility] 04/10/23, patient reports " Me and my sister got into it and she lied  that I threatened her, and they said it was not a reason to stay here, but they are trying to help me get to a shelter and they said I'll stay one night and tomorrow we'll figure it out".  Per AM NP note "On last night patient reports she locked the door to her bedroom because she was "trying to prevent a situation (with sister), I am trying to lay low waiting for job [employer] they are supposed to call me in a week, I am not trying to cause any problems."     Patient denies substance use, and reports she's been living with her sister since August 15 "but not anymore". She denies access to a gun, denies history of suicide attempts or self harm behaviors, she also denies history of inpatient psychiatric hospitalizations.   Patient reports she is diagnosed with Bipolar and ADHD and prescribed Vraylar, clonidine, Lexapro, Focalin, and metformin. She reports being medication compliant.  Patient is unable to remember the name of her outpatient psychiatric provider, but reports she ran out of her medications about two months ago, but has an upcoming psychiatric appointment with a previous provider at [facility], Dr. Lambert Cummings, on 04/21/2023.   Support, encouragement and reassurance provided about ongoing stressors, patient is provided with opportunity for questions.    Discussed recommendation for admission to the continuous observation unit overnight as previously discussed and reevaluate in the a.m.  Patient verbalized her understanding and is in agreement.  Total Time spent with patient: 20 minutes  Musculoskeletal  Strength & Muscle Tone: within normal limits Gait &  Station: normal Patient leans: N/A  Psychiatric Specialty Exam  Presentation General Appearance:  Other (comment) (In hospital scrubs)  Eye Contact: Good  Speech: Clear and Coherent  Speech Volume: Normal  Handedness: Right   Mood and Affect  Mood: Anxious  Affect: Congruent   Thought Process  Thought  Processes: Coherent  Descriptions of Associations:Intact  Orientation:Full (Time, Place and Person)  Thought Content:WDL  Diagnosis of Schizophrenia or Schizoaffective disorder in past: Yes   Hallucinations:Hallucinations: None  Ideas of Reference:None  Suicidal Thoughts:Suicidal Thoughts: No  Homicidal Thoughts:Homicidal Thoughts: No   Sensorium  Memory: Immediate Fair  Judgment: Intact  Insight: Fair   Art therapist  Concentration: Good  Attention Span: Good  Recall: Good  Fund of Knowledge: Fair  Language: Fair   Psychomotor Activity  Psychomotor Activity: Psychomotor Activity: Normal   Assets  Assets: Communication Skills; Desire for Improvement   Sleep  Sleep: Sleep: Good   Nutritional Assessment (For OBS and FBC admissions only) Has the patient had a weight loss or gain of 10 pounds or more in the last 3 months?: No Has the patient had a decrease in food intake/or appetite?: No Does the patient have dental problems?: No Does the patient have eating habits or behaviors that may be indicators of an eating disorder including binging or inducing vomiting?: No Has the patient recently lost weight without trying?: 0 Has the patient been eating poorly because of a decreased appetite?: 0 Malnutrition Screening Tool Score: 0    Physical Exam Constitutional:      General: She is not in acute distress.    Appearance: She is not diaphoretic.  HENT:     Head: Normocephalic.     Right Ear: External ear normal.     Left Ear: External ear normal.     Nose: No congestion.  Eyes:     General:        Right eye: No discharge.        Left eye: No discharge.  Cardiovascular:     Rate and Rhythm: Tachycardia present.  Pulmonary:     Effort: No respiratory distress.  Chest:     Chest wall: No tenderness.  Neurological:     Mental Status: She is alert and oriented to person, place, and time.  Psychiatric:        Attention and  Perception: Attention and perception normal.        Mood and Affect: Mood is anxious.        Speech: Speech normal.        Behavior: Behavior is cooperative.        Thought Content: Thought content normal. Thought content is not paranoid or delusional. Thought content does not include homicidal or suicidal ideation. Thought content does not include homicidal or suicidal plan.        Cognition and Memory: Cognition and memory normal.    Review of Systems  Constitutional:  Negative for chills, diaphoresis and fever.  HENT:  Negative for congestion.   Eyes:  Negative for discharge.  Respiratory:  Negative for cough, shortness of breath and wheezing.   Cardiovascular:  Negative for chest pain and palpitations.  Gastrointestinal:  Negative for diarrhea, nausea and vomiting.  Neurological:  Negative for dizziness, seizures, loss of consciousness, weakness and headaches.  Psychiatric/Behavioral:  The patient is nervous/anxious.     Blood pressure 101/67, pulse (!) 109, temperature 98.3 F (36.8 C), temperature source Oral, resp. rate 18, SpO2 100%. There is no height or weight  on file to calculate BMI.  Past Psychiatric History: See H & P   Is the patient at risk to self? No  Has the patient been a risk to self in the past 6 months? No .    Has the patient been a risk to self within the distant past? No   Is the patient a risk to others? No   Has the patient been a risk to others in the past 6 months? No   Has the patient been a risk to others within the distant past? No   Past Medical History: Seasonal allergies  Family History:  biological mother- schizophrenia   Social History:  resides with sister, not employed, denies alcohol and substances use .   Last Labs:  Admission on 04/10/2023, Discharged on 04/11/2023  Component Date Value Ref Range Status   WBC 04/10/2023 5.3  4.0 - 10.5 K/uL Final   RBC 04/10/2023 4.58  3.87 - 5.11 MIL/uL Final   Hemoglobin 04/10/2023 11.0 (L)  12.0  - 15.0 g/dL Final   HCT 52/84/1324 34.9 (L)  36.0 - 46.0 % Final   MCV 04/10/2023 76.2 (L)  80.0 - 100.0 fL Final   MCH 04/10/2023 24.0 (L)  26.0 - 34.0 pg Final   MCHC 04/10/2023 31.5  30.0 - 36.0 g/dL Final   RDW 40/05/2724 14.1  11.5 - 15.5 % Final   Platelets 04/10/2023 203  150 - 400 K/uL Final   nRBC 04/10/2023 0.0  0.0 - 0.2 % Final   Neutrophils Relative % 04/10/2023 29  % Final   Neutro Abs 04/10/2023 1.5 (L)  1.7 - 7.7 K/uL Final   Lymphocytes Relative 04/10/2023 53  % Final   Lymphs Abs 04/10/2023 2.8  0.7 - 4.0 K/uL Final   Monocytes Relative 04/10/2023 14  % Final   Monocytes Absolute 04/10/2023 0.7  0.1 - 1.0 K/uL Final   Eosinophils Relative 04/10/2023 4  % Final   Eosinophils Absolute 04/10/2023 0.2  0.0 - 0.5 K/uL Final   Basophils Relative 04/10/2023 0  % Final   Basophils Absolute 04/10/2023 0.0  0.0 - 0.1 K/uL Final   Immature Granulocytes 04/10/2023 0  % Final   Abs Immature Granulocytes 04/10/2023 0.00  0.00 - 0.07 K/uL Final   Performed at [facility], [address] [place], [place] [address]   Sodium 04/10/2023 138  135 - 145 mmol/L Final   Potassium 04/10/2023 3.8  3.5 - 5.1 mmol/L Final   Chloride 04/10/2023 106  98 - 111 mmol/L Final   CO2 04/10/2023 24  22 - 32 mmol/L Final   Glucose, Bld 04/10/2023 109 (H)  70 - 99 mg/dL Final   Glucose reference range applies only to samples taken after fasting for at least 8 hours.   BUN 04/10/2023 8  6 - 20 mg/dL Final   Creatinine, Ser 04/10/2023 0.70  0.44 - 1.00 mg/dL Final   Calcium 03/47/4259 8.8 (L)  8.9 - 10.3 mg/dL Final   Total Protein 56/38/7564 6.1 (L)  6.5 - 8.1 g/dL Final   Albumin 33/29/5188 3.0 (L)  3.5 - 5.0 g/dL Final   AST 41/66/0630 37  15 - 41 U/L Final   ALT 04/10/2023 51 (H)  0 - 44 U/L Final   Alkaline Phosphatase 04/10/2023 68  38 - 126 U/L Final   Total Bilirubin 04/10/2023 0.5  0.3 - 1.2 mg/dL Final   GFR, Estimated 04/10/2023 >60  >60 mL/min Final   Comment: (NOTE) Calculated  using the CKD-EPI Creatinine Equation (2021)    Anion gap 04/10/2023 8  5 - 15 Final   Performed at [facility], [address] [place], [place] [address]   Troponin I (High Sensitivity) 04/10/2023 3  <18 ng/L Final   Comment: (NOTE) Elevated high sensitivity troponin I (hsTnI) values and significant  changes across serial measurements may suggest ACS but many other  chronic and acute conditions are known to elevate hsTnI results.  Refer to the "Links" section for chest pain algorithms and additional  guidance. Performed at [facility], [address], [place], [place] [address]    Free T4 04/10/2023 4.04 (H)  0.61 - 1.12 ng/dL Final   Comment: (NOTE) Biotin ingestion may interfere with free T4 tests. If the results are inconsistent with the TSH level, previous test results, or the clinical presentation, then consider biotin interference. If needed, order repeat testing after stopping biotin. Performed at [facility], [address] [place], [place] [address]    D-Dimer, Quant 04/10/2023 0.48  0.00 - 0.50 ug/mL-FEU Final   Comment: (NOTE) At the manufacturer cut-off value of 0.5 g/mL FEU, this assay has a negative predictive value of 95-100%.This assay is intended for use in conjunction with a clinical pretest probability (PTP) assessment model to exclude pulmonary embolism (PE) and deep venous thrombosis (DVT) in outpatients suspected of PE or DVT. Results should be correlated with clinical presentation. Performed at [facility], [address], [place], [place] [address]    Troponin I (High Sensitivity) 04/10/2023 4  <18 ng/L Final   Comment: (NOTE) Elevated high sensitivity troponin I (hsTnI) values and significant  changes across serial measurements may suggest ACS but many other  chronic and acute conditions are known to elevate hsTnI results.  Refer to the "Links" section for chest pain algorithms and additional  guidance. Performed at  [facility], [address], [place], [place] [address]   Admission on 04/10/2023, Discharged on 04/10/2023  Component Date Value Ref Range Status   Preg Test, Ur 04/10/2023 NEGATIVE  NEGATIVE Final   Comment:        THE SENSITIVITY OF THIS METHODOLOGY IS >24 mIU/mL    Sodium 04/10/2023 137  135 - 145 mmol/L Final   Potassium 04/10/2023 4.0  3.5 - 5.1 mmol/L Final   Chloride 04/10/2023 103  98 - 111 mmol/L Final   CO2 04/10/2023 25  22 - 32 mmol/L Final   Glucose, Bld 04/10/2023 84  70 - 99 mg/dL Final   Glucose reference range applies only to samples taken after fasting for at least 8 hours.   BUN 04/10/2023 7  6 - 20 mg/dL Final   Creatinine, Ser 04/10/2023 0.64  0.44 - 1.00 mg/dL Final   Calcium 52/84/1324 9.2  8.9 - 10.3 mg/dL Final   Total Protein 40/05/2724 6.5  6.5 - 8.1 g/dL Final   Albumin 36/64/4034 3.3 (L)  3.5 - 5.0 g/dL Final   AST 74/25/9563 40  15 - 41 U/L Final   ALT 04/10/2023 59 (H)  0 - 44 U/L Final   Alkaline Phosphatase 04/10/2023 79  38 - 126 U/L Final   Total Bilirubin 04/10/2023 1.0  0.3 - 1.2 mg/dL Final   GFR, Estimated 04/10/2023 >60  >60 mL/min Final   Comment: (NOTE) Calculated using the CKD-EPI Creatinine Equation (2021)    Anion gap 04/10/2023 9  5 - 15 Final   Performed at [facility],  [address] [place], [place] [address]   Magnesium 04/10/2023 1.8  1.7 - 2.4 mg/dL Final   Performed at [facility], [address], [place], [place] [address]   Alcohol, Ethyl (B) 04/10/2023 <10  <10 mg/dL Final   Comment: (NOTE) Lowest detectable limit for serum alcohol is 10 mg/dL.  For medical purposes only. Performed at [facility], [address] [place], [place] [address]    TSH 04/10/2023 <0.010 (L)  0.350 - 4.500 uIU/mL Final   Comment: Performed by a 3rd Generation assay with a functional sensitivity of <=0.01 uIU/mL. Performed at [facility], [address] [place], [place] [address]    POC Amphetamine UR  04/10/2023 None Detected  NONE DETECTED (Cut Off Level 1000 ng/mL) Final   POC Secobarbital (BAR) 04/10/2023 None Detected  NONE DETECTED (Cut Off Level 300 ng/mL) Final   POC Buprenorphine (BUP) 04/10/2023 None Detected  NONE DETECTED (Cut Off Level 10 ng/mL) Final   POC Oxazepam (BZO) 04/10/2023 None Detected  NONE DETECTED (Cut Off Level 300 ng/mL) Final   POC Cocaine UR 04/10/2023 None Detected  NONE DETECTED (Cut Off Level 300 ng/mL) Final   POC Methamphetamine UR 04/10/2023 None Detected  NONE DETECTED (Cut Off Level 1000 ng/mL) Final   POC Morphine 04/10/2023 None Detected  NONE DETECTED (Cut Off Level 300 ng/mL) Final   POC Methadone UR 04/10/2023 None Detected  NONE DETECTED (Cut Off Level 300 ng/mL) Final   POC Oxycodone UR 04/10/2023 None Detected  NONE DETECTED (Cut Off Level 100 ng/mL) Final   POC Marijuana UR 04/10/2023 None Detected  NONE DETECTED (Cut Off Level 50 ng/mL) Final   WBC 04/10/2023 5.3  4.0 - 10.5 K/uL Final   RBC 04/10/2023 4.60  3.87 - 5.11 MIL/uL Final   Hemoglobin 04/10/2023 11.3 (L)  12.0 - 15.0 g/dL Final   HCT 66/01/3015 35.1 (L)  36.0 - 46.0 % Final   MCV 04/10/2023 76.3 (L)  80.0 - 100.0 fL Final   MCH 04/10/2023 24.6 (L)  26.0 - 34.0 pg Final   MCHC 04/10/2023 32.2  30.0 - 36.0 g/dL Final   RDW 08/13/3233 14.1  11.5 - 15.5 % Final   Platelets 04/10/2023 216  150 - 400 K/uL Final   nRBC 04/10/2023 0.0  0.0 - 0.2 % Final   Neutrophils Relative % 04/10/2023 29  % Final   Neutro Abs 04/10/2023 1.5 (L)  1.7 - 7.7 K/uL Final   Lymphocytes Relative 04/10/2023 53  % Final   Lymphs Abs 04/10/2023 2.8  0.7 - 4.0 K/uL Final   Monocytes Relative 04/10/2023 15  % Final   Monocytes Absolute 04/10/2023 0.8  0.1 - 1.0 K/uL Final   Eosinophils Relative 04/10/2023 3  % Final   Eosinophils Absolute 04/10/2023 0.2  0.0 - 0.5 K/uL Final   Basophils Relative 04/10/2023 0  % Final   Basophils Absolute 04/10/2023 0.0  0.0 - 0.1 K/uL Final   Immature Granulocytes 04/10/2023  0  % Final   Abs Immature Granulocytes 04/10/2023 0.00  0.00 - 0.07 K/uL Final   Performed at [facility], [address], [place], [place] [address]    Allergies: Apple, Cherry, and Ibuprofen  Medications:  Facility Ordered Medications  Medication   acetaminophen (TYLENOL) tablet 650 mg   alum & mag hydroxide-simeth (MAALOX/MYLANTA) 200-200-20 MG/5ML suspension 30 mL   magnesium hydroxide (MILK OF MAGNESIA) suspension 30 mL   hydrOXYzine (ATARAX) tablet 25 mg  PTA Medications  Medication Sig   VRAYLAR 3 MG capsule Take 3 mg by mouth at bedtime.   escitalopram (LEXAPRO) 10 MG tablet Take 10 mg by mouth daily.   dexmethylphenidate (FOCALIN XR) 10 MG 24 hr capsule Take 1 capsule by mouth every morning.   metFORMIN (GLUCOPHAGE) 1000 MG tablet Take 1,000 mg by mouth daily.      Medical Decision Making  Recommend admission to the continuous observation unit overnight for safety monitoring and reevaluate in the a.m.  Review of the labs:CBC without leukocytosis, mild anemia present but stable compared to prior. CMP with overall normal electrolytes, no AKI or anion gap. Troponin WNL, T4 elevated to 4. Metoprolol 25 mg PO BID recomended.   Medications continued per EDP recommendation -Metoprolol 25 mg PO BID for VT and overactive  Home medications restarted -Focalin XR 10 mg Po daily for ADHD -Lexapro 10 mg PO daily for depressive symptoms  Other prns -Tylenol 650 mg p.o. every 6 hours as needed pain -Maalox 30 mL p.o. every 4 hours as needed indigestion -Atarax 25 mg p.o. 3 times daily as needed anxiety -MOM 30 mL p.o. daily as needed constipation  Recommendations  Based on my evaluation the patient does not appear to have an emergency medical condition.  Recommend admission to continuous observation unit overnight for safety monitoring and re-eval in am for SI/HI/ AVH.   Mancel Bale, NP 04/11/23  2:21 AM

## 2023-04-11 NOTE — ED Provider Notes (Signed)
Behavioral Health Progress Note  Date and Time: 04/11/2023 3:46 PM Name: Alison Cummings MRN:  [id number]  Subjective:  Patient states "[facility] am [facility] and I will call them back today."  She verbalizes readiness to discharge today.  She does have natural supports in the area while waiting for shelter availability.   Patient reassessed by this nurse practitioner face-to-face.  She is reclined in observation area upon my approach, appears asleep.  She is easily awakened.  She denies physical complaints, no apparent distress.  She is alert and oriented, pleasant and cooperative during assessment.  She presents with euthymic mood, congruent affect.   Alison Cummings continues to deny suicidal and homicidal ideations.  She denies auditory and visual hallucinations.  There is no evidence of delusional thought content and no indication that patient is responding to internal stimuli.  She denies symptoms of paranoia.   Alison Cummings is able to articulate treatment plan to include follow-up with previous outpatient psychiatry provider at [facility].  She is aware that she should follow-up with established outpatient primary care provider, Filomena Jungling with [facility] to address diagnosis of hyperthyroidism and tachycardia confirmed during this admission.  Reviewed medications including metoprolol, discussed potential side effects and offered opportunity for patient to ask questions.   Patient offered support and encouragement.  She declines any personal contact for collateral information at this time.  She will follow-up with her family as well as a previous school teacher who is a continuous emotional support in her life.    Patient and family are educated and verbalize understanding of mental health resources and other crisis services in the community. They are instructed to call 911 and present to the nearest emergency room should patient experience any suicidal/homicidal  ideation, auditory/visual/hallucinations, or detrimental worsening of mental health condition.    Chart reviewed and patient discussed with Dr. Nelly Rout on 04/11/2023.     Stay Summary: 04/10/2023- 1003am HPI: Patient presents voluntarily to [facility], transported by [facility] examiner.  Patient's sister contacted law enforcement this morning, asked patient to leave her home and made patient aware she is not welcome to return to sister's home.   Patient is assessed by this nurse practitioner face-to-face.  She is seated in assessment area, no apparent distress.  She is alert and oriented, pleasant and cooperative during assessment.  She presents with depressed mood, congruent affect.  Patient reviewed with Dr. Nelly Rout on 04/10/2023.   Alison Cummings reports recent stressors include ongoing verbal conflict with her sister.  Patient reports her sister argues with her frequently about inconsequential concerns including dirty dishes and videogame controllers.   Patient reports she has resided with her sister for 3 weeks after being asked to leave the home of her biological mother 3 weeks ago.  Patient reports mother has diagnosis of schizophrenia and was "having an episode." On last night patient reports she locked the door to her bedroom because she was "trying to prevent a situation (with sister), I am trying to lay low waiting for job [employer] they are supposed to call me in a week, I am not trying to cause any problems."    Alison Cummings reports diagnosis of schizoaffective disorder and ADHD.  Per chart review additional diagnoses include intermittent explosive disorder and adjustment disorder.  Patient ran out of her medications approximately 2 months ago.  She is scheduled to follow-up with previous outpatient psychiatry provider at [facility], Dr. Lambert Mody, on 04/21/2023.  She is able to recall previous medications including clonidine 0.3 mg nightly, Lexapro and Vraylar.  She was  also prescribed a medication to address ADHD, unable to recall specific medication.  She denies history of inpatient psychiatric hospitalization.  Family mental health history includes patient's mother and father both diagnosed with schizophrenia and bipolar disorder.   Patient denies suicidal and homicidal ideations.  She denies history of suicide attempts, denies history of nonsuicidal self-harm behavior.  She denies auditory and visual hallucinations.  There is no evidence of delusional thought content no indication that patient is responding to internal stimuli.   Alison Cummings would like to be restarted on medications.  She agrees with plan for admission to continuous observation unit at [facility].  Reviewed medications and discussed potential side effects, patient offered opportunity to ask questions. Medication will be restarted at starting dose related to extended time without medications.   Alaila most recently resided with her sister, reports inability to return.  She denies access to weapons.  She would like to be admitted to a homeless shelter if possible.  She is not currently employed.  She denies alcohol and substance use.  She endorses average sleep and appetite.   Patient offered support and encouragement.  She gives verbal consent to speak with her biological mother, Micki Riley phone number [phone].  Biological mother's phone currently not accepting calls. Patient gives verbal consent to speak with her adoptive mother, Keari Buran phone number [phone].  Per adoptive mother, Bonita Quin, patient has exhibited aggressive behaviors toward her biological mother and is not permitted to return to biological mother's home.  Patient not permitted to return to home of adoptive mother at this time related to adoptive mother caring for several foster children and does not have space currently.  Diagnosis:  Final diagnoses:  Stress and adjustment reaction  Family  discord    Total Time spent with patient: 45 minutes  Past Psychiatric History: see above Past Medical History: hyperthyroidism, tachycardia Family History: none reported Family Psychiatric  History: see above Social History: currently homeless  Additional Social History:                         Sleep: Good  Appetite:  Good  Current Medications:  Current Facility-Administered Medications  Medication Dose Route Frequency Provider Last Rate Last Admin   acetaminophen (TYLENOL) tablet 650 mg  650 mg Oral Q6H PRN Onuoha, Chinwendu V, NP       alum & mag hydroxide-simeth (MAALOX/MYLANTA) 200-200-20 MG/5ML suspension 30 mL  30 mL Oral Q4H PRN Onuoha, Chinwendu V, NP       [START ON 04/12/2023] cariprazine (VRAYLAR) capsule 1.5 mg  1.5 mg Oral Daily Lenard Lance, FNP       dexmethylphenidate (FOCALIN XR) 24 hr capsule 10 mg  10 mg Oral q morning Onuoha, Chinwendu V, NP   10 mg at 04/11/23 0938   escitalopram (LEXAPRO) tablet 10 mg  10 mg Oral Daily Onuoha, Chinwendu V, NP   10 mg at 04/11/23 6578   hydrOXYzine (ATARAX) tablet 25 mg  25 mg Oral TID PRN Onuoha, Chinwendu V, NP       magnesium hydroxide (MILK OF MAGNESIA) suspension 30 mL  30 mL Oral Daily PRN Onuoha, Chinwendu V, NP       metoprolol tartrate (LOPRESSOR) tablet 50 mg  50 mg Oral BID Lenard Lance, FNP       Current Outpatient Medications  Medication Sig  Dispense Refill   dexmethylphenidate (FOCALIN XR) 10 MG 24 hr capsule Take 1 capsule by mouth every morning.     escitalopram (LEXAPRO) 10 MG tablet Take 10 mg by mouth daily.     metFORMIN (GLUCOPHAGE) 1000 MG tablet Take 1,000 mg by mouth daily.     VRAYLAR 3 MG capsule Take 3 mg by mouth at bedtime.      Labs  Lab Results:  Admission on 04/10/2023, Discharged on 04/11/2023  Component Date Value Ref Range Status   WBC 04/10/2023 5.3  4.0 - 10.5 K/uL Final   RBC 04/10/2023 4.58  3.87 - 5.11 MIL/uL Final   Hemoglobin 04/10/2023 11.0 (L)  12.0 - 15.0 g/dL  Final   HCT 16/05/9603 34.9 (L)  36.0 - 46.0 % Final   MCV 04/10/2023 76.2 (L)  80.0 - 100.0 fL Final   MCH 04/10/2023 24.0 (L)  26.0 - 34.0 pg Final   MCHC 04/10/2023 31.5  30.0 - 36.0 g/dL Final   RDW 54/04/8118 14.1  11.5 - 15.5 % Final   Platelets 04/10/2023 203  150 - 400 K/uL Final   nRBC 04/10/2023 0.0  0.0 - 0.2 % Final   Neutrophils Relative % 04/10/2023 29  % Final   Neutro Abs 04/10/2023 1.5 (L)  1.7 - 7.7 K/uL Final   Lymphocytes Relative 04/10/2023 53  % Final   Lymphs Abs 04/10/2023 2.8  0.7 - 4.0 K/uL Final   Monocytes Relative 04/10/2023 14  % Final   Monocytes Absolute 04/10/2023 0.7  0.1 - 1.0 K/uL Final   Eosinophils Relative 04/10/2023 4  % Final   Eosinophils Absolute 04/10/2023 0.2  0.0 - 0.5 K/uL Final   Basophils Relative 04/10/2023 0  % Final   Basophils Absolute 04/10/2023 0.0  0.0 - 0.1 K/uL Final   Immature Granulocytes 04/10/2023 0  % Final   Abs Immature Granulocytes 04/10/2023 0.00  0.00 - 0.07 K/uL Final   Performed at [facility], [address], [place], [place] [address]   Sodium 04/10/2023 138  135 - 145 mmol/L Final   Potassium 04/10/2023 3.8  3.5 - 5.1 mmol/L Final   Chloride 04/10/2023 106  98 - 111 mmol/L Final   CO2 04/10/2023 24  22 - 32 mmol/L Final   Glucose, Bld 04/10/2023 109 (H)  70 - 99 mg/dL Final   Glucose reference range applies only to samples taken after fasting for at least 8 hours.   BUN 04/10/2023 8  6 - 20 mg/dL Final   Creatinine, Ser 04/10/2023 0.70  0.44 - 1.00 mg/dL Final   Calcium 95/62/1308 8.8 (L)  8.9 - 10.3 mg/dL Final   Total Protein 65/78/4696 6.1 (L)  6.5 - 8.1 g/dL Final   Albumin 29/52/8413 3.0 (L)  3.5 - 5.0 g/dL Final   AST 24/40/1027 37  15 - 41 U/L Final   ALT 04/10/2023 51 (H)  0 - 44 U/L Final   Alkaline Phosphatase 04/10/2023 68  38 - 126 U/L Final   Total Bilirubin 04/10/2023 0.5  0.3 - 1.2 mg/dL Final   GFR, Estimated 04/10/2023 >60  >60 mL/min Final   Comment: (NOTE) Calculated using the  CKD-EPI Creatinine Equation (2021)    Anion gap 04/10/2023 8  5 - 15 Final   Performed at [facility], [address] [place], [place] [address]   Troponin I (High Sensitivity) 04/10/2023 3  <18 ng/L Final   Comment: (NOTE) Elevated high sensitivity troponin I (hsTnI) values and  significant  changes across serial measurements may suggest ACS but many other  chronic and acute conditions are known to elevate hsTnI results.  Refer to the "Links" section for chest pain algorithms and additional  guidance. Performed at [facility], [address] [place], [place] [address]    Free T4 04/10/2023 4.04 (H)  0.61 - 1.12 ng/dL Final   Comment: (NOTE) Biotin ingestion may interfere with free T4 tests. If the results are inconsistent with the TSH level, previous test results, or the clinical presentation, then consider biotin interference. If needed, order repeat testing after stopping biotin. Performed at [facility], [address], [place], [place] [address]    D-Dimer, Quant 04/10/2023 0.48  0.00 - 0.50 ug/mL-FEU Final   Comment: (NOTE) At the manufacturer cut-off value of 0.5 g/mL FEU, this assay has a negative predictive value of 95-100%.This assay is intended for use in conjunction with a clinical pretest probability (PTP) assessment model to exclude pulmonary embolism (PE) and deep venous thrombosis (DVT) in outpatients suspected of PE or DVT. Results should be correlated with clinical presentation. Performed at [facility], [address] [place], [place] [address]    Troponin I (High Sensitivity) 04/10/2023 4  <18 ng/L Final   Comment: (NOTE) Elevated high sensitivity troponin I (hsTnI) values and significant  changes across serial measurements may suggest ACS but many other  chronic and acute conditions are known to elevate hsTnI results.  Refer to the "Links" section for chest pain algorithms and additional  guidance. Performed at [facility], [address] [place], [place] [address]   Admission on 04/10/2023, Discharged on 04/10/2023  Component Date Value Ref Range Status   Preg Test, Ur 04/10/2023 NEGATIVE  NEGATIVE Final   Comment:        THE SENSITIVITY OF THIS METHODOLOGY IS >24 mIU/mL    Sodium 04/10/2023 137  135 - 145 mmol/L Final   Potassium 04/10/2023 4.0  3.5 - 5.1 mmol/L Final   Chloride 04/10/2023 103  98 - 111 mmol/L Final   CO2 04/10/2023 25  22 - 32 mmol/L Final   Glucose, Bld 04/10/2023 84  70 - 99 mg/dL Final   Glucose reference range applies only to samples taken after fasting for at least 8 hours.   BUN 04/10/2023 7  6 - 20 mg/dL Final   Creatinine, Ser 04/10/2023 0.64  0.44 - 1.00 mg/dL Final   Calcium 29/56/2130 9.2  8.9 - 10.3 mg/dL Final   Total Protein 86/57/8469 6.5  6.5 - 8.1 g/dL Final   Albumin 62/95/2841 3.3 (L)  3.5 - 5.0 g/dL Final   AST 32/44/0102 40  15 - 41 U/L Final   ALT 04/10/2023 59 (H)  0 - 44 U/L Final   Alkaline Phosphatase 04/10/2023 79  38 - 126 U/L Final   Total Bilirubin 04/10/2023 1.0  0.3 - 1.2 mg/dL Final   GFR, Estimated 04/10/2023 >60  >60 mL/min Final   Comment: (NOTE) Calculated using the CKD-EPI Creatinine Equation (2021)    Anion gap 04/10/2023 9  5 - 15 Final   Performed at [facility], [address] [place], [place] [address]   Magnesium 04/10/2023 1.8  1.7 - 2.4 mg/dL Final   Performed at [facility], [address] [place], [place] [address]   Alcohol, Ethyl (B) 04/10/2023 <10  <10 mg/dL Final   Comment: (NOTE) Lowest detectable limit for serum alcohol is 10 mg/dL.  For medical purposes only. Performed at [facility], [address] [place], [place] [address]    TSH 04/10/2023 <0.010 (L)  0.350 - 4.500 uIU/mL Final   Comment: Performed by a 3rd Generation assay with a functional sensitivity of <=0.01 uIU/mL. Performed at [facility], [address], [place], [place] [address]    POC Amphetamine UR 04/10/2023  None Detected  NONE DETECTED (Cut Off Level 1000 ng/mL) Final   POC Secobarbital (BAR) 04/10/2023 None Detected  NONE DETECTED (Cut Off Level 300 ng/mL) Final   POC Buprenorphine (BUP) 04/10/2023 None Detected  NONE DETECTED (Cut Off Level 10 ng/mL) Final   POC Oxazepam (BZO) 04/10/2023 None Detected  NONE DETECTED (Cut Off Level 300 ng/mL) Final   POC Cocaine UR 04/10/2023 None Detected  NONE DETECTED (Cut Off Level 300 ng/mL) Final   POC Methamphetamine UR 04/10/2023 None Detected  NONE DETECTED (Cut Off Level 1000 ng/mL) Final   POC Morphine 04/10/2023 None Detected  NONE DETECTED (Cut Off Level 300 ng/mL) Final   POC Methadone UR 04/10/2023 None Detected  NONE DETECTED (Cut Off Level 300 ng/mL) Final   POC Oxycodone UR 04/10/2023 None Detected  NONE DETECTED (Cut Off Level 100 ng/mL) Final   POC Marijuana UR 04/10/2023 None Detected  NONE DETECTED (Cut Off Level 50 ng/mL) Final   WBC 04/10/2023 5.3  4.0 - 10.5 K/uL Final   RBC 04/10/2023 4.60  3.87 - 5.11 MIL/uL Final   Hemoglobin 04/10/2023 11.3 (L)  12.0 - 15.0 g/dL Final   HCT 09/81/1914 35.1 (L)  36.0 - 46.0 % Final   MCV 04/10/2023 76.3 (L)  80.0 - 100.0 fL Final   MCH 04/10/2023 24.6 (L)  26.0 - 34.0 pg Final   MCHC 04/10/2023 32.2  30.0 - 36.0 g/dL Final   RDW 78/29/5621 14.1  11.5 - 15.5 % Final   Platelets 04/10/2023 216  150 - 400 K/uL Final   nRBC 04/10/2023 0.0  0.0 - 0.2 % Final   Neutrophils Relative % 04/10/2023 29  % Final   Neutro Abs 04/10/2023 1.5 (L)  1.7 - 7.7 K/uL Final   Lymphocytes Relative 04/10/2023 53  % Final   Lymphs Abs 04/10/2023 2.8  0.7 - 4.0 K/uL Final   Monocytes Relative 04/10/2023 15  % Final   Monocytes Absolute 04/10/2023 0.8  0.1 - 1.0 K/uL Final   Eosinophils Relative 04/10/2023 3  % Final   Eosinophils Absolute 04/10/2023 0.2  0.0 - 0.5 K/uL Final   Basophils Relative 04/10/2023 0  % Final   Basophils Absolute 04/10/2023 0.0  0.0 - 0.1 K/uL Final   Immature Granulocytes 04/10/2023 0  % Final    Abs Immature Granulocytes 04/10/2023 0.00  0.00 - 0.07 K/uL Final   Performed at [facility], [address], [place], [place] [address]    Blood Alcohol level:  Lab Results  Component Value Date   ETH <10 04/10/2023   ETH <10 08/02/2018    Metabolic Disorder Labs: No results found for: "HGBA1C", "MPG" No results found for: "PROLACTIN" No results found for: "CHOL", "TRIG", "HDL", "CHOLHDL", "VLDL", "LDLCALC"  Therapeutic Lab Levels: No results found for: "LITHIUM" No results found for: "VALPROATE" No results found for: "CBMZ"  Physical Findings   Flowsheet Row ED from 04/11/2023 in [facility] Most recent reading at 04/11/2023  2:36 AM ED from 04/10/2023 in [facility] Emergency Department at [facility] Most recent reading at 04/10/2023  8:12 PM ED from 04/10/2023 in [facility] Most recent reading at 04/10/2023 11:32 AM  C-SSRS RISK CATEGORY No Risk No Risk No Risk        Musculoskeletal  Strength & Muscle Tone: within normal limits Gait & Station: normal Patient leans: N/A  Psychiatric Specialty Exam  Presentation  General Appearance:  Appropriate for Environment; Casual  Eye Contact: Good  Speech: Clear and Coherent; Normal Rate  Speech Volume: Normal  Handedness: Right   Mood and Affect  Mood: Euthymic  Affect: Appropriate; Congruent   Thought Process  Thought Processes: Coherent; Goal Directed; Linear  Descriptions of Associations:Intact  Orientation:Full (Time, Place and Person)  Thought Content:Logical; WDL  Diagnosis of Schizophrenia or Schizoaffective disorder in past: Yes    Hallucinations:Hallucinations: None  Ideas of Reference:None  Suicidal Thoughts:Suicidal Thoughts: No  Homicidal Thoughts:Homicidal Thoughts: No   Sensorium  Memory: Immediate Good; Recent Fair  Judgment: Fair  Insight: Fair   Art therapist   Concentration: Good  Attention Span: Good  Recall: Good  Fund of Knowledge: Fair  Language: Fair   Psychomotor Activity  Psychomotor Activity: Psychomotor Activity: Normal   Assets  Assets: Communication Skills; Desire for Improvement; Financial Resources/Insurance; Physical Health; Resilience; Social Support   Sleep  Sleep: Sleep: Good   Nutritional Assessment (For OBS and FBC admissions only) Has the patient had a weight loss or gain of 10 pounds or more in the last 3 months?: No Has the patient had a decrease in food intake/or appetite?: No Does the patient have dental problems?: No Does the patient have eating habits or behaviors that may be indicators of an eating disorder including binging or inducing vomiting?: No Has the patient recently lost weight without trying?: 0 Has the patient been eating poorly because of a decreased appetite?: 0 Malnutrition Screening Tool Score: 0    Physical Exam  Physical Exam Vitals and nursing note reviewed.  Constitutional:      Appearance: Normal appearance. She is well-developed.  HENT:     Head: Normocephalic.     Nose: Nose normal.  Cardiovascular:     Rate and Rhythm: Tachycardia present.  Pulmonary:     Effort: Pulmonary effort is normal.  Musculoskeletal:        General: Normal range of motion.     Cervical back: Normal range of motion.  Skin:    General: Skin is warm and dry.  Neurological:     Mental Status: She is alert and oriented to person, place, and time.  Psychiatric:        Attention and Perception: Attention and perception normal.        Mood and Affect: Mood and affect normal.        Speech: Speech normal.        Behavior: Behavior normal. Behavior is cooperative.        Thought Content: Thought content normal.        Cognition and Memory: Cognition and memory normal.    Review of Systems  Constitutional: Negative.   HENT: Negative.    Eyes: Negative.   Respiratory: Negative.     Cardiovascular: Negative.   Gastrointestinal: Negative.   Genitourinary: Negative.   Musculoskeletal: Negative.   Skin: Negative.   Neurological: Negative.   Psychiatric/Behavioral: Negative.     Blood pressure 133/70, pulse (!) 112, temperature 98.4 F (36.9 C), temperature source Oral, resp. rate 20, SpO2 99%. There is no height or weight on file to calculate BMI.  Treatment  Plan Summary: Daily contact with patient to assess and evaluate symptoms and progress in treatment  Patient unable to secure shelter upon discharge.  She has reached out to multiple family members including her adoptive mother and a sister, unable to find lodging for tonight. Patient will continue to reach out to biological mother who may let her return to her home while waiting to be accepted to shelter on Monday.  Dymonique will remain in an continuous observation unit for reassessment on 04/12/2023.  She will continue to reach out to family members and other shelter resources provided.  Patient remains tachycardic.  Metoprolol increased from 25 mg twice daily to 50 mg twice daily.  Lenard Lance, FNP 04/11/2023 3:46 PM

## 2023-04-11 NOTE — ED Notes (Signed)
Pt returned from [facility] upon being medical cleared. She denies SI/HI or AVH Patient contracted for safety. Is currently calm and cooperative. Safety of environment ensured. Skin check conducted per facility protocol. Will continue to monitor for safety.

## 2023-04-11 NOTE — Discharge Instructions (Addendum)
Patient is instructed prior to discharge to:  Take all medications as prescribed by his/her mental healthcare provider. Report any adverse effects and or reactions from the medicines to his/her outpatient provider promptly. Keep all scheduled appointments, to ensure that you are getting refills on time and to avoid any interruption in your medication.  If you are unable to keep an appointment call to reschedule.  Be sure to follow-up with resources and follow-up appointments provided.  Patient has been instructed & cautioned: To not engage in alcohol and or illegal drug use while on prescription medicines. In the event of worsening symptoms, patient is instructed to call the crisis hotline, 911 and or go to the nearest ED for appropriate evaluation and treatment of symptoms. To follow-up with his/her primary care provider for your other medical issues, concerns and or health care needs.  Information: -National Suicide Prevention Lifeline 1-800-SUICIDE or 4045184542.  -988 offers 24/7 access to trained crisis counselors who can help people experiencing mental health-related distress. People can call or text 988 or chat 988lifeline.org for themselves or if they are worried about a loved one who may need crisis support.    [facility] [address] [place], [place]  [address] [phone] Population served: Female veterans 18+ with substance abuse issues Eligibility: By referral only  [facility] [address], [place], [place] [address] [phone] Population served: Adult men & women (56 years old and older, able to perform activities for daily living) Documents required: Valid ID & [place] [address] [place], [place]  [address] [phone] Population served: Families with children  [facility] [address] [place], [place]   [address] [phone] Population served: Single women 18+ without dependents Documents required:  [place] Card  [facility] [address], [place], [place]  [address] [phone] Population served: Female veterans 18+ with substance abuse/mental health issues Eligibility: By referral only  [facility] [address] [place], [place] [address] [phone] Population served: Males 18+ Documents required: Valid ID & Social Security Card  [facility] at [employer] [facility] [place] [address] [place] [address] [phone] or [phone] Population served: Pregnant women with or without children  Documents required: Valid ID & Social Security [employer] [place], [place], [place] [address] [phone] Population Served: Families with children, adult women, and adult men.  [facility] [address] [place], [place] [address] [phone] Population served: Men 18+, preference for disabled and/or veterans Eligibility: By referral only  [facility]) - [facility] [place], [place], [place] [address] [phone] or [phone] Population served: Families with children.

## 2023-04-11 NOTE — ED Notes (Signed)
Patient was provided breakfast

## 2023-04-11 NOTE — ED Notes (Signed)
Pt transferred to [facility], they called to request we discharge so that they can admit the pt.  Done

## 2023-04-11 NOTE — ED Notes (Signed)
Pt sleeping at present, no distress noted.  Monitoring for safety. 

## 2023-04-11 NOTE — ED Notes (Signed)
Pt alert, laying in recliner bed. Slow to respond to assessment questions. Requires encouragement. Denies SI/HI/AVH. No noted distress. Will continue to monitor for safety

## 2023-04-11 NOTE — ED Notes (Signed)
Pt sitting up on side of bed eating breakfast. Breathing is even and unlabored, no s/s of distress or SOB noted/voiced. Pt denies SI, HI, pain and AVH. Will continue to monitor for safety.

## 2023-04-11 NOTE — ED Notes (Signed)
Patient in milieu. Environment is secured. Will continue to monitor for safety. 

## 2023-04-11 NOTE — ED Notes (Signed)
Patient was provided lunch.

## 2023-04-11 NOTE — ED Provider Notes (Signed)
1:34 AM Sitting upright in bed on cell phone. Patient stable appearing, no complaints. Stable for transfer back to [facility].   Antony Madura, PA-C 04/11/23 1610    Zadie Rhine, MD 04/11/23 [phone]

## 2023-04-11 NOTE — ED Notes (Signed)
Patient states she "feels dizzy" vitals obtained and provider notified - will continue to monitor for safety

## 2023-04-12 DIAGNOSIS — Z638 Other specified problems related to primary support group: Secondary | ICD-10-CM

## 2023-04-12 DIAGNOSIS — F4329 Adjustment disorder with other symptoms: Secondary | ICD-10-CM | POA: Diagnosis not present

## 2023-04-12 DIAGNOSIS — F4320 Adjustment disorder, unspecified: Secondary | ICD-10-CM | POA: Diagnosis not present

## 2023-04-12 DIAGNOSIS — F439 Reaction to severe stress, unspecified: Secondary | ICD-10-CM | POA: Diagnosis not present

## 2023-04-12 LAB — URINALYSIS, ROUTINE W REFLEX MICROSCOPIC
Bilirubin Urine: NEGATIVE
Glucose, UA: NEGATIVE mg/dL
Ketones, ur: 20 mg/dL — AB
Nitrite: NEGATIVE
Protein, ur: NEGATIVE mg/dL
Specific Gravity, Urine: 1.021 (ref 1.005–1.030)
WBC, UA: >50 WBC/hpf (ref 0–5)
pH: 5 (ref 5.0–8.0)

## 2023-04-12 LAB — T3: T3, Total: 429 ng/dL — ABNORMAL HIGH (ref 71–180)

## 2023-04-12 LAB — PROLACTIN: Prolactin: 13.8 ng/mL (ref 4.8–33.4)

## 2023-04-12 MED ORDER — METRONIDAZOLE 250 MG PO TABS
250.0000 mg | ORAL_TABLET | Freq: Two times a day (BID) | ORAL | Status: DC
  Administered 2023-04-12 – 2023-04-14 (×5): 250 mg via ORAL
  Filled 2023-04-12: qty 1
  Filled 2023-04-12: qty 10
  Filled 2023-04-12 (×4): qty 1

## 2023-04-12 NOTE — ED Provider Notes (Addendum)
Behavioral Health Progress Note  Date and Time: 04/12/2023 10:10 AM Name: Alison Cummings MRN:  [id number]  Subjective:  [name] 22 year old African-American female presents after verbal altercation between she and her mother.  Patient was escorted to [facility] urgent care facility behavioral response team.  She continues to deny suicidal and homicidal ideations.  Denies auditory or visual hallucinations.  Patient's home medications was restarted.  She has been taking and tolerating well.  She reports she is awaiting to hear back from [facility]  women[facility] shelter.  Stated they will have a decision on 9/9.   Chyla reported vaginal odor and discharge to nursing staff.  This provider collected urinary analysis and will place order for sexually-transmitted infection screening.  Patient reports fishy odor, white discharge.  Reported history with bacterial vaginosis. Stated that her urine smells the same. " Burnt and fishy." She denied that she is sexually active currently.  She denied pain with urination.  Will treat with metronidazole x 7 days prophylactically pending STI results.   During evaluation [name] is sitting; she is alert/oriented x 3; calm/cooperative; and mood congruent with affect.  Patient is speaking in a clear tone at moderate volume, and normal pace; with good eye contact. Her thought process is coherent and relevant; There is no indication that he is currently responding to internal/external stimuli or experiencing delusional thought content.  Patient denies suicidal/self-harm/homicidal ideation, psychosis, and paranoia.  Patient has remained calm throughout assessment and has answered questions appropriately.  Diagnosis:  Final diagnoses:  Stress and adjustment reaction  Family discord    Total Time spent with patient: 15 minutes  Past Psychiatric History: See HPI Past Medical History: See HPi Family History: See HPI Family Psychiatric   History: See HPI Social History: See HPI  Additional Social History:                         Sleep: Good  Appetite:  Good  Current Medications:  Current Facility-Administered Medications  Medication Dose Route Frequency Provider Last Rate Last Admin   acetaminophen (TYLENOL) tablet 650 mg  650 mg Oral Q6H PRN Onuoha, Chinwendu V, NP       alum & mag hydroxide-simeth (MAALOX/MYLANTA) 200-200-20 MG/5ML suspension 30 mL  30 mL Oral Q4H PRN Onuoha, Chinwendu V, NP       cariprazine (VRAYLAR) capsule 1.5 mg  1.5 mg Oral Daily Lenard Lance, FNP   1.5 mg at 04/12/23 0910   dexmethylphenidate (FOCALIN XR) 24 hr capsule 10 mg  10 mg Oral q morning Onuoha, Chinwendu V, NP   10 mg at 04/12/23 0910   escitalopram (LEXAPRO) tablet 10 mg  10 mg Oral Daily Onuoha, Chinwendu V, NP   10 mg at 04/12/23 0910   hydrOXYzine (ATARAX) tablet 25 mg  25 mg Oral TID PRN Onuoha, Chinwendu V, NP       magnesium hydroxide (MILK OF MAGNESIA) suspension 30 mL  30 mL Oral Daily PRN Onuoha, Chinwendu V, NP       metoprolol tartrate (LOPRESSOR) tablet 50 mg  50 mg Oral BID Lenard Lance, FNP   50 mg at 04/12/23 0910   metroNIDAZOLE (FLAGYL) tablet 250 mg  250 mg Oral Q12H Oneta Rack, NP   250 mg at 04/12/23 0930   Current Outpatient Medications  Medication Sig Dispense Refill   dexmethylphenidate (FOCALIN XR) 10 MG 24 hr capsule Take 1 capsule by mouth every morning.  escitalopram (LEXAPRO) 10 MG tablet Take 10 mg by mouth daily.     metFORMIN (GLUCOPHAGE) 1000 MG tablet Take 1,000 mg by mouth daily.     VRAYLAR 3 MG capsule Take 3 mg by mouth at bedtime.      Labs  Lab Results:  Admission on 04/11/2023  Component Date Value Ref Range Status   Glucose-Capillary 04/11/2023 86  70 - 99 mg/dL Final   Glucose reference range applies only to samples taken after fasting for at least 8 hours.  Admission on 04/10/2023, Discharged on 04/11/2023  Component Date Value Ref Range Status   WBC 04/10/2023  5.3  4.0 - 10.5 K/uL Final   RBC 04/10/2023 4.58  3.87 - 5.11 MIL/uL Final   Hemoglobin 04/10/2023 11.0 (L)  12.0 - 15.0 g/dL Final   HCT 46/96/2952 34.9 (L)  36.0 - 46.0 % Final   MCV 04/10/2023 76.2 (L)  80.0 - 100.0 fL Final   MCH 04/10/2023 24.0 (L)  26.0 - 34.0 pg Final   MCHC 04/10/2023 31.5  30.0 - 36.0 g/dL Final   RDW 84/13/2440 14.1  11.5 - 15.5 % Final   Platelets 04/10/2023 203  150 - 400 K/uL Final   nRBC 04/10/2023 0.0  0.0 - 0.2 % Final   Neutrophils Relative % 04/10/2023 29  % Final   Neutro Abs 04/10/2023 1.5 (L)  1.7 - 7.7 K/uL Final   Lymphocytes Relative 04/10/2023 53  % Final   Lymphs Abs 04/10/2023 2.8  0.7 - 4.0 K/uL Final   Monocytes Relative 04/10/2023 14  % Final   Monocytes Absolute 04/10/2023 0.7  0.1 - 1.0 K/uL Final   Eosinophils Relative 04/10/2023 4  % Final   Eosinophils Absolute 04/10/2023 0.2  0.0 - 0.5 K/uL Final   Basophils Relative 04/10/2023 0  % Final   Basophils Absolute 04/10/2023 0.0  0.0 - 0.1 K/uL Final   Immature Granulocytes 04/10/2023 0  % Final   Abs Immature Granulocytes 04/10/2023 0.00  0.00 - 0.07 K/uL Final   Performed at [facility], [address], [place], [place] [address]   Sodium 04/10/2023 138  135 - 145 mmol/L Final   Potassium 04/10/2023 3.8  3.5 - 5.1 mmol/L Final   Chloride 04/10/2023 106  98 - 111 mmol/L Final   CO2 04/10/2023 24  22 - 32 mmol/L Final   Glucose, Bld 04/10/2023 109 (H)  70 - 99 mg/dL Final   Glucose reference range applies only to samples taken after fasting for at least 8 hours.   BUN 04/10/2023 8  6 - 20 mg/dL Final   Creatinine, Ser 04/10/2023 0.70  0.44 - 1.00 mg/dL Final   Calcium 53/66/4403 8.8 (L)  8.9 - 10.3 mg/dL Final   Total Protein 47/42/5956 6.1 (L)  6.5 - 8.1 g/dL Final   Albumin 38/75/6433 3.0 (L)  3.5 - 5.0 g/dL Final   AST 29/51/8841 37  15 - 41 U/L Final   ALT 04/10/2023 51 (H)  0 - 44 U/L Final   Alkaline Phosphatase 04/10/2023 68  38 - 126 U/L Final   Total Bilirubin  04/10/2023 0.5  0.3 - 1.2 mg/dL Final   GFR, Estimated 04/10/2023 >60  >60 mL/min Final   Comment: (NOTE) Calculated using the CKD-EPI Creatinine Equation (2021)    Anion gap 04/10/2023 8  5 - 15 Final   Performed at [facility], [address], [place], [place] [address]   Troponin I (High Sensitivity) 04/10/2023 3  <  18 ng/L Final   Comment: (NOTE) Elevated high sensitivity troponin I (hsTnI) values and significant  changes across serial measurements may suggest ACS but many other  chronic and acute conditions are known to elevate hsTnI results.  Refer to the "Links" section for chest pain algorithms and additional  guidance. Performed at [facility], [address], [place], [place] [address]    Free T4 04/10/2023 4.04 (H)  0.61 - 1.12 ng/dL Final   Comment: (NOTE) Biotin ingestion may interfere with free T4 tests. If the results are inconsistent with the TSH level, previous test results, or the clinical presentation, then consider biotin interference. If needed, order repeat testing after stopping biotin. Performed at [facility], [address], [place], [place] [address]    D-Dimer, Quant 04/10/2023 0.48  0.00 - 0.50 ug/mL-FEU Final   Comment: (NOTE) At the manufacturer cut-off value of 0.5 g/mL FEU, this assay has a negative predictive value of 95-100%.This assay is intended for use in conjunction with a clinical pretest probability (PTP) assessment model to exclude pulmonary embolism (PE) and deep venous thrombosis (DVT) in outpatients suspected of PE or DVT. Results should be correlated with clinical presentation. Performed at [facility], [address] [place], [place] [address]    Troponin I (High Sensitivity) 04/10/2023 4  <18 ng/L Final   Comment: (NOTE) Elevated high sensitivity troponin I (hsTnI) values and significant  changes across serial measurements may suggest ACS but many other  chronic and acute conditions are known to  elevate hsTnI results.  Refer to the "Links" section for chest pain algorithms and additional  guidance. Performed at [facility], [address], [place], [place] [address]   Admission on 04/10/2023, Discharged on 04/10/2023  Component Date Value Ref Range Status   Preg Test, Ur 04/10/2023 NEGATIVE  NEGATIVE Final   Comment:        THE SENSITIVITY OF THIS METHODOLOGY IS >24 mIU/mL    Sodium 04/10/2023 137  135 - 145 mmol/L Final   Potassium 04/10/2023 4.0  3.5 - 5.1 mmol/L Final   Chloride 04/10/2023 103  98 - 111 mmol/L Final   CO2 04/10/2023 25  22 - 32 mmol/L Final   Glucose, Bld 04/10/2023 84  70 - 99 mg/dL Final   Glucose reference range applies only to samples taken after fasting for at least 8 hours.   BUN 04/10/2023 7  6 - 20 mg/dL Final   Creatinine, Ser 04/10/2023 0.64  0.44 - 1.00 mg/dL Final   Calcium 95/28/4132 9.2  8.9 - 10.3 mg/dL Final   Total Protein 44/08/270 6.5  6.5 - 8.1 g/dL Final   Albumin 53/66/4403 3.3 (L)  3.5 - 5.0 g/dL Final   AST 47/42/5956 40  15 - 41 U/L Final   ALT 04/10/2023 59 (H)  0 - 44 U/L Final   Alkaline Phosphatase 04/10/2023 79  38 - 126 U/L Final   Total Bilirubin 04/10/2023 1.0  0.3 - 1.2 mg/dL Final   GFR, Estimated 04/10/2023 >60  >60 mL/min Final   Comment: (NOTE) Calculated using the CKD-EPI Creatinine Equation (2021)    Anion gap 04/10/2023 9  5 - 15 Final   Performed at [facility], [address], [place], [place] [address]   Magnesium 04/10/2023 1.8  1.7 - 2.4 mg/dL Final   Performed at [facility], [address], [place], [place] [address]   Alcohol, Ethyl (B) 04/10/2023 <10  <10  mg/dL Final   Comment: (NOTE) Lowest detectable limit for serum alcohol is 10 mg/dL.  For medical purposes only. Performed at [facility], [address], [place], [place] [address]    TSH 04/10/2023 <0.010 (L)  0.350 - 4.500 uIU/mL Final   Comment: Performed by a 3rd Generation assay with a functional sensitivity  of <=0.01 uIU/mL. Performed at [facility], [address] [place], [place] [address]    Prolactin 04/10/2023 13.8  4.8 - 33.4 ng/mL Final   Comment: (NOTE) Performed At: [facility] [address] [place], [place] [id number] Jolene Schimke MD [id number]    POC Amphetamine UR 04/10/2023 None Detected  NONE DETECTED (Cut Off Level 1000 ng/mL) Final   POC Secobarbital (BAR) 04/10/2023 None Detected  NONE DETECTED (Cut Off Level 300 ng/mL) Final   POC Buprenorphine (BUP) 04/10/2023 None Detected  NONE DETECTED (Cut Off Level 10 ng/mL) Final   POC Oxazepam (BZO) 04/10/2023 None Detected  NONE DETECTED (Cut Off Level 300 ng/mL) Final   POC Cocaine UR 04/10/2023 None Detected  NONE DETECTED (Cut Off Level 300 ng/mL) Final   POC Methamphetamine UR 04/10/2023 None Detected  NONE DETECTED (Cut Off Level 1000 ng/mL) Final   POC Morphine 04/10/2023 None Detected  NONE DETECTED (Cut Off Level 300 ng/mL) Final   POC Methadone UR 04/10/2023 None Detected  NONE DETECTED (Cut Off Level 300 ng/mL) Final   POC Oxycodone UR 04/10/2023 None Detected  NONE DETECTED (Cut Off Level 100 ng/mL) Final   POC Marijuana UR 04/10/2023 None Detected  NONE DETECTED (Cut Off Level 50 ng/mL) Final   WBC 04/10/2023 5.3  4.0 - 10.5 K/uL Final   RBC 04/10/2023 4.60  3.87 - 5.11 MIL/uL Final   Hemoglobin 04/10/2023 11.3 (L)  12.0 - 15.0 g/dL Final   HCT 36/14/4315 35.1 (L)  36.0 - 46.0 % Final   MCV 04/10/2023 76.3 (L)  80.0 - 100.0 fL Final   MCH 04/10/2023 24.6 (L)  26.0 - 34.0 pg Final   MCHC 04/10/2023 32.2  30.0 - 36.0 g/dL Final   RDW 40/03/6760 14.1  11.5 - 15.5 % Final   Platelets 04/10/2023 216  150 - 400 K/uL Final   nRBC 04/10/2023 0.0  0.0 - 0.2 % Final   Neutrophils Relative % 04/10/2023 29  % Final   Neutro Abs 04/10/2023 1.5 (L)  1.7 - 7.7 K/uL Final   Lymphocytes Relative 04/10/2023 53  % Final   Lymphs Abs 04/10/2023 2.8  0.7 - 4.0 K/uL Final   Monocytes Relative 04/10/2023 15  %  Final   Monocytes Absolute 04/10/2023 0.8  0.1 - 1.0 K/uL Final   Eosinophils Relative 04/10/2023 3  % Final   Eosinophils Absolute 04/10/2023 0.2  0.0 - 0.5 K/uL Final   Basophils Relative 04/10/2023 0  % Final   Basophils Absolute 04/10/2023 0.0  0.0 - 0.1 K/uL Final   Immature Granulocytes 04/10/2023 0  % Final   Abs Immature Granulocytes 04/10/2023 0.00  0.00 - 0.07 K/uL Final   Performed at [facility], [address] [place], [place] [address]    Blood Alcohol level:  Lab Results  Component Value Date   ETH <10 04/10/2023   ETH <10 08/02/2018    Metabolic Disorder Labs: No results found for: "HGBA1C", "MPG" Lab Results  Component Value Date   PROLACTIN 13.8 04/10/2023   No results found for: "CHOL", "TRIG", "HDL", "CHOLHDL", "VLDL", "LDLCALC"  Therapeutic Lab Levels: No results  found for: "LITHIUM" No results found for: "VALPROATE" No results found for: "CBMZ"  Physical Findings   Flowsheet Row ED from 04/11/2023 in [facility] Most recent reading at 04/11/2023  2:36 AM ED from 04/10/2023 in [facility] Department at [facility] Most recent reading at 04/10/2023  8:12 PM ED from 04/10/2023 in [facility] Most recent reading at 04/10/2023 11:32 AM  C-SSRS RISK CATEGORY No Risk No Risk No Risk        Musculoskeletal  Strength & Muscle Tone: within normal limits Gait & Station: normal Patient leans: N/A  Psychiatric Specialty Exam  Presentation  General Appearance:  Appropriate for Environment; Casual  Eye Contact: Good  Speech: Clear and Coherent; Normal Rate  Speech Volume: Normal  Handedness: Right   Mood and Affect  Mood: Euthymic  Affect: Appropriate; Congruent   Thought Process  Thought Processes: Coherent; Goal Directed; Linear  Descriptions of Associations:Intact  Orientation:Full (Time, Place and Person)  Thought Content:Logical; WDL  Diagnosis of  Schizophrenia or Schizoaffective disorder in past: Yes    Hallucinations:Hallucinations: None  Ideas of Reference:None  Suicidal Thoughts:Suicidal Thoughts: No  Homicidal Thoughts:Homicidal Thoughts: No   Sensorium  Memory: Immediate Good; Recent Fair  Judgment: Fair  Insight: Fair   Art therapist  Concentration: Good  Attention Span: Good  Recall: Good  Fund of Knowledge: Fair  Language: Fair   Psychomotor Activity  Psychomotor Activity: Psychomotor Activity: Normal   Assets  Assets: Communication Skills; Desire for Improvement; Financial Resources/Insurance; Physical Health; Resilience; Social Support   Sleep  Sleep: Sleep: Good   Nutritional Assessment (For OBS and FBC admissions only) Has the patient had a weight loss or gain of 10 pounds or more in the last 3 months?: No Has the patient had a decrease in food intake/or appetite?: No Does the patient have dental problems?: No Does the patient have eating habits or behaviors that may be indicators of an eating disorder including binging or inducing vomiting?: No Has the patient recently lost weight without trying?: 0 Has the patient been eating poorly because of a decreased appetite?: 0 Malnutrition Screening Tool Score: 0    Physical Exam  Physical Exam Vitals and nursing note reviewed.  Cardiovascular:     Rate and Rhythm: Normal rate and regular rhythm.  Pulmonary:     Effort: Pulmonary effort is normal.     Breath sounds: Normal breath sounds.  Abdominal:     General: Abdomen is flat.  Neurological:     Mental Status: She is alert and oriented to person, place, and time.  Psychiatric:        Mood and Affect: Mood normal.        Behavior: Behavior normal.        Thought Content: Thought content normal.    Review of Systems  Psychiatric/Behavioral:  Positive for depression. Negative for substance abuse and suicidal ideas. The patient is nervous/anxious.   All other  systems reviewed and are negative.  Blood pressure 126/72, pulse (!) 122, temperature 97.7 F (36.5 C), temperature source Oral, resp. rate 18, SpO2 98%. There is no height or weight on file to calculate BMI.  Treatment Plan Summary: Daily contact with patient to assess and evaluate symptoms and progress in treatment and Medication management Awaiting assistance, CSW for housing resources last woman shelter placement  Oneta Rack, NP 04/12/2023 10:10 AM

## 2023-04-12 NOTE — ED Notes (Signed)
Patient alert and oriented.  Denies SI, HI, AVH, and pain. Pt reports odor in vagina, provider notified.  No new orders at this time. Scheduled medications administered to patient, per MD orders. Support and encouragement provided.  Routine safety checks conducted every hour.  Patient informed to notify staff with problems or concerns. No adverse drug reactions noted. Patient contracts for safety at this time. Patient compliant with medications and treatment plan. Patient receptive, calm, and cooperative. Patient interacts well with others on the unit.  Patient remains safe at this time.

## 2023-04-12 NOTE — ED Notes (Signed)
No pain or discomfort noted/ reported. Pt alert, oriented, and ambulatory.  Breathing is even and  unlabored.  Will continue to monitor for safety. 

## 2023-04-12 NOTE — ED Notes (Signed)
Patient observed/assessed in bed/chair resting quietly appearing in no distress and verbalizing no complaints at this time. Will continue to monitor.  

## 2023-04-12 NOTE — ED Notes (Signed)
Pt observed/assessed in recliner sleeping. RR even and unlabored, appearing in no noted distress. Environmental check complete, will continue to monitor for safety 

## 2023-04-12 NOTE — ED Notes (Signed)
Pt has provided urine stat lab transport called to take labs to [facility].

## 2023-04-13 DIAGNOSIS — Z638 Other specified problems related to primary support group: Secondary | ICD-10-CM | POA: Diagnosis not present

## 2023-04-13 DIAGNOSIS — F4329 Adjustment disorder with other symptoms: Secondary | ICD-10-CM | POA: Diagnosis not present

## 2023-04-13 MED ORDER — METOPROLOL TARTRATE 50 MG PO TABS: 50.0000 mg | ORAL_TABLET | Freq: Two times a day (BID) | ORAL | 0 refills | Status: DC

## 2023-04-13 MED ORDER — METRONIDAZOLE 250 MG PO TABS: 250.0000 mg | ORAL_TABLET | Freq: Two times a day (BID) | ORAL | 0 refills | Status: DC

## 2023-04-13 NOTE — ED Notes (Signed)
Patient observed/assessed in bed/chair resting quietly appearing in no distress and verbalizing no complaints at this time. Will continue to monitor.  

## 2023-04-13 NOTE — ED Provider Notes (Addendum)
FBC/OBS ASAP Discharge Summary  Date and Time: 04/13/2023 10:07 AM  Name: Alison Cummings  MRN:  [id number]   Discharge Diagnoses:  Final diagnoses:  Stress and adjustment reaction  Family discord    Subjective: Alison Cummings is a 22 year old African-American female presented due to unspecified mood disorder.  She reports a verbal altercation between she and her biological mom.  States she is unable to return back to the home.  Previous provider reached out to adopted mother.  It was reported due to patient's aggression and impulsivity she is not allowed to return to her home either.  Patient was awaiting acceptance to [facility].  [facility] shelter.  And she reports she would be able to follow-up at [facility] on 9 /9.  Today patient is asking to discharged to go reside with one of her IEP counselors from school.  She reports she has been taking and tolerating her medications well.  She was provided with additional outpatient resources for follow-up for therapy and psychiatry services.   Denied any safety concerns at admission.  Patient adamantly denies suicidal or homicidal ideations.  No auditory visual hallucination.  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  Currently taking Focalin 10 mg, Lexapro 10 mg daily and Vraylar 1.5 mg  During evaluation Collen A B Langlois is sitting upright on her bed in no acute distress. She is alert/oriented x 4; calm/cooperative; and mood congruent with affect. She is speaking in a clear tone at moderate volume, and normal pace; with good eye contact. Her thought process is coherent and relevant; There is no indication that she is currently responding to internal/external stimuli or experiencing delusional thought content; and she has denied suicidal/self-harm/homicidal ideation, psychosis, and paranoia.   Patient has remained calm throughout assessment and has answered questions  appropriately.     [name] is educated and verbalizes understanding of mental health resources and other crisis services in the community.She is instructed to call 911 and present to the nearest emergency room should she experience any suicidal/homicidal ideation, auditory/visual/hallucinations, or detrimental worsening of her mental health condition.  She was a also advised by Clinical research associate that she could call the toll-free phone on insurance card to assist with identifying in network counselors and agencies or number on back of Medicaid card to speak with care coordinator.   Stay Summary:   Total Time spent with patient: 15 minutes  Past Psychiatric History:  Past Medical History:  Family History:  Family Psychiatric History:  Social History:  Tobacco Cessation:  N/A, patient does not currently use tobacco products  Current Medications:  Current Facility-Administered Medications  Medication Dose Route Frequency Provider Last Rate Last Admin   acetaminophen (TYLENOL) tablet 650 mg  650 mg Oral Q6H PRN Onuoha, Chinwendu V, NP       alum & mag hydroxide-simeth (MAALOX/MYLANTA) 200-200-20 MG/5ML suspension 30 mL  30 mL Oral Q4H PRN Onuoha, Chinwendu V, NP       cariprazine (VRAYLAR) capsule 1.5 mg  1.5 mg Oral Daily Doran Heater L, FNP   1.5 mg at 04/13/23 1003   dexmethylphenidate (FOCALIN XR) 24 hr capsule 10 mg  10 mg Oral q morning Onuoha, Chinwendu V, NP   10 mg at 04/13/23 1003   escitalopram (LEXAPRO) tablet 10 mg  10 mg Oral Daily Onuoha, Chinwendu V, NP   10 mg at 04/13/23 1003   hydrOXYzine (ATARAX) tablet 25 mg  25  mg Oral TID PRN Onuoha, Chinwendu V, NP   25 mg at 04/12/23 2115   magnesium hydroxide (MILK OF MAGNESIA) suspension 30 mL  30 mL Oral Daily PRN Onuoha, Chinwendu V, NP       metoprolol tartrate (LOPRESSOR) tablet 50 mg  50 mg Oral BID Lenard Lance, FNP   50 mg at 04/13/23 1003   metroNIDAZOLE (FLAGYL) tablet 250 mg  250 mg Oral Q12H Oneta Rack, NP   250 mg at  04/13/23 1004   Current Outpatient Medications  Medication Sig Dispense Refill   dexmethylphenidate (FOCALIN XR) 10 MG 24 hr capsule Take 1 capsule by mouth every morning.     escitalopram (LEXAPRO) 10 MG tablet Take 10 mg by mouth daily.     metFORMIN (GLUCOPHAGE) 1000 MG tablet Take 1,000 mg by mouth daily.     VRAYLAR 3 MG capsule Take 3 mg by mouth at bedtime.      PTA Medications:  Facility Ordered Medications  Medication   acetaminophen (TYLENOL) tablet 650 mg   alum & mag hydroxide-simeth (MAALOX/MYLANTA) 200-200-20 MG/5ML suspension 30 mL   magnesium hydroxide (MILK OF MAGNESIA) suspension 30 mL   hydrOXYzine (ATARAX) tablet 25 mg   dexmethylphenidate (FOCALIN XR) 24 hr capsule 10 mg   escitalopram (LEXAPRO) tablet 10 mg   metoprolol tartrate (LOPRESSOR) tablet 50 mg   [COMPLETED] metoprolol tartrate (LOPRESSOR) tablet 25 mg   cariprazine (VRAYLAR) capsule 1.5 mg   metroNIDAZOLE (FLAGYL) tablet 250 mg   PTA Medications  Medication Sig   VRAYLAR 3 MG capsule Take 3 mg by mouth at bedtime.   escitalopram (LEXAPRO) 10 MG tablet Take 10 mg by mouth daily.   dexmethylphenidate (FOCALIN XR) 10 MG 24 hr capsule Take 1 capsule by mouth every morning.   metFORMIN (GLUCOPHAGE) 1000 MG tablet Take 1,000 mg by mouth daily.        No data to display          Flowsheet Row ED from 04/11/2023 in [facility] Most recent reading at 04/11/2023  2:36 AM ED from 04/10/2023 in [facility] Emergency Department at [facility] Most recent reading at 04/10/2023  8:12 PM ED from 04/10/2023 in [facility] Most recent reading at 04/10/2023 11:32 AM  C-SSRS RISK CATEGORY No Risk No Risk No Risk       Musculoskeletal  Strength & Muscle Tone: within normal limits Gait & Station: normal Patient leans: N/A  Psychiatric Specialty Exam  Presentation  General Appearance:  Appropriate for Environment; Casual  Eye  Contact: Good  Speech: Clear and Coherent; Normal Rate  Speech Volume: Normal  Handedness: Right   Mood and Affect  Mood: Euthymic  Affect: Appropriate; Congruent   Thought Process  Thought Processes: Coherent; Goal Directed; Linear  Descriptions of Associations:Intact  Orientation:Full (Time, Place and Person)  Thought Content:Logical; WDL  Diagnosis of Schizophrenia or Schizoaffective disorder in past: Yes    Hallucinations:No data recorded Ideas of Reference:None  Suicidal Thoughts:No data recorded Homicidal Thoughts:No data recorded  Sensorium  Memory: Immediate Good; Recent Fair  Judgment: Fair  Insight: Fair   Art therapist  Concentration: Good  Attention Span: Good  Recall: Good  Fund of Knowledge: Fair  Language: Fair   Psychomotor Activity  Psychomotor Activity:No data recorded  Assets  Assets: Communication Skills; Desire for Improvement; Financial Resources/Insurance; Physical Health; Resilience; Social Support   Sleep  Sleep:6    Physical Exam  Physical Exam Vitals and  nursing note reviewed.  Cardiovascular:     Rate and Rhythm: Normal rate and regular rhythm.  Pulmonary:     Effort: Pulmonary effort is normal.     Breath sounds: Normal breath sounds.  Neurological:     Mental Status: She is alert.  Psychiatric:        Mood and Affect: Mood normal.        Thought Content: Thought content normal.    Review of Systems  Psychiatric/Behavioral:  Negative for depression and suicidal ideas. The patient is nervous/anxious.   All other systems reviewed and are negative.  Blood pressure 128/69, pulse (!) 107, temperature 98.7 F (37.1 C), temperature source Oral, resp. rate 19, SpO2 100%. There is no height or weight on file to calculate BMI.  Demographic Factors:  Low socioeconomic status  Loss Factors: Decrease in vocational status and Financial problems/change in socioeconomic status  Historical  Factors: Impulsivity  Risk Reduction Factors:   Positive social support and Positive therapeutic relationship  Continued Clinical Symptoms:  Depression:   Impulsivity  Cognitive Features That Contribute To Risk:  Closed-mindedness    Suicide Risk:  Minimal: No identifiable suicidal ideation.  Patients presenting with no risk factors but with morbid ruminations; may be classified as minimal risk based on the severity of the depressive symptoms  Plan Of Care/Follow-up recommendations:  Activity:  as tolerated Diet:  heart healthy   Disposition: Take all of you medications as prescribed by your mental healthcare provider.  Report any adverse effects and reactions from your medications to your outpatient provider promptly.  Do not engage in alcohol and or illegal drug use while on prescription medicines. Keep all scheduled appointments. This is to ensure that you are getting refills on time and to avoid any interruption in your medication.  If you are unable to keep an appointment call to reschedule.  Be sure to follow up with resources and follow ups given.  In the event of worsening symptoms call the crisis hotline, 911, and or go to the nearest emergency department for appropriate evaluation and treatment of symptoms. Follow-up with your primary care provider for your medical issues, concerns and or health care needs.    Oneta Rack, NP 04/13/2023, 10:07 AM

## 2023-04-13 NOTE — ED Notes (Signed)
Patient has made several attempts to reach her IEP teacher by phone, as the patient said the teacher told her she could stay with her. However patient has been unsuccessful in reaching her.

## 2023-04-13 NOTE — ED Notes (Signed)
Patient alert and oriented x 3. Patient verbalizes no complaints at this time and denies S/I, H/I, and A/V/H. Patient's affect is flat, eye contact is evasive, and speech is low. Will continue to monitor and support.

## 2023-04-13 NOTE — ED Notes (Signed)
Patient was provided lunch.

## 2023-04-14 DIAGNOSIS — F4329 Adjustment disorder with other symptoms: Secondary | ICD-10-CM | POA: Diagnosis not present

## 2023-04-14 DIAGNOSIS — Z638 Other specified problems related to primary support group: Secondary | ICD-10-CM | POA: Diagnosis not present

## 2023-04-14 NOTE — ED Notes (Signed)
Patient observed/assessed in bed/chair resting quietly appearing in no distress and verbalizing no complaints at this time. Will continue to monitor.  

## 2023-04-14 NOTE — ED Provider Notes (Signed)
Patient observed sitting on the unit eating breakfast.  She is alert/oriented x 4, calm, cooperative, and in no acute distress.  She is adamantly denying SI/HI/AVH.  She does not appear to be responding to internal/external stimuli.  Patient states she normally lives with her sister whom she has had multiple disagreement and altercations with.  She is no longer allowed to return to her sister's home.  Patient has been accepted to [facility] in [place].  She will return forted via taxi.  She has no immediate safety concerns being discharged.

## 2023-04-20 DIAGNOSIS — J039 Acute tonsillitis, unspecified: Secondary | ICD-10-CM | POA: Diagnosis not present

## 2023-04-20 DIAGNOSIS — R590 Localized enlarged lymph nodes: Secondary | ICD-10-CM | POA: Diagnosis not present

## 2023-04-20 DIAGNOSIS — R6883 Chills (without fever): Secondary | ICD-10-CM | POA: Diagnosis not present

## 2023-04-20 DIAGNOSIS — R000 Tachycardia, unspecified: Secondary | ICD-10-CM | POA: Diagnosis not present

## 2023-04-20 DIAGNOSIS — D649 Anemia, unspecified: Secondary | ICD-10-CM | POA: Diagnosis not present

## 2023-04-20 DIAGNOSIS — J029 Acute pharyngitis, unspecified: Secondary | ICD-10-CM | POA: Diagnosis not present

## 2023-04-20 DIAGNOSIS — J351 Hypertrophy of tonsils: Secondary | ICD-10-CM | POA: Diagnosis not present

## 2023-04-20 DIAGNOSIS — R457 State of emotional shock and stress, unspecified: Secondary | ICD-10-CM | POA: Diagnosis not present

## 2023-04-20 DIAGNOSIS — R519 Headache, unspecified: Secondary | ICD-10-CM | POA: Diagnosis not present

## 2023-04-20 DIAGNOSIS — R1084 Generalized abdominal pain: Secondary | ICD-10-CM | POA: Diagnosis not present

## 2023-04-21 DIAGNOSIS — R000 Tachycardia, unspecified: Secondary | ICD-10-CM | POA: Diagnosis not present

## 2023-04-21 DIAGNOSIS — R0600 Dyspnea, unspecified: Secondary | ICD-10-CM | POA: Diagnosis not present

## 2023-04-23 DIAGNOSIS — Z760 Encounter for issue of repeat prescription: Secondary | ICD-10-CM | POA: Diagnosis not present

## 2023-04-23 DIAGNOSIS — R070 Pain in throat: Secondary | ICD-10-CM | POA: Diagnosis not present

## 2023-04-23 DIAGNOSIS — J039 Acute tonsillitis, unspecified: Secondary | ICD-10-CM | POA: Diagnosis not present

## 2023-04-23 DIAGNOSIS — R0602 Shortness of breath: Secondary | ICD-10-CM | POA: Diagnosis not present

## 2023-04-30 DIAGNOSIS — Z3202 Encounter for pregnancy test, result negative: Secondary | ICD-10-CM | POA: Diagnosis not present

## 2023-05-12 DIAGNOSIS — Z5181 Encounter for therapeutic drug level monitoring: Secondary | ICD-10-CM | POA: Diagnosis not present

## 2023-05-12 DIAGNOSIS — F900 Attention-deficit hyperactivity disorder, predominantly inattentive type: Secondary | ICD-10-CM | POA: Diagnosis not present

## 2023-05-12 DIAGNOSIS — F3131 Bipolar disorder, current episode depressed, mild: Secondary | ICD-10-CM | POA: Diagnosis not present

## 2023-05-12 DIAGNOSIS — F411 Generalized anxiety disorder: Secondary | ICD-10-CM | POA: Diagnosis not present

## 2023-06-11 ENCOUNTER — Ambulatory Visit (INDEPENDENT_AMBULATORY_CARE_PROVIDER_SITE_OTHER): Payer: 59 | Admitting: Nurse Practitioner

## 2023-06-11 ENCOUNTER — Encounter: Payer: Self-pay | Admitting: Nurse Practitioner

## 2023-06-11 VITALS — BP 126/65 | HR 107 | Temp 97.7°F | Ht 62.0 in | Wt 213.0 lb

## 2023-06-11 DIAGNOSIS — F39 Unspecified mood [affective] disorder: Secondary | ICD-10-CM

## 2023-06-11 DIAGNOSIS — I10 Essential (primary) hypertension: Secondary | ICD-10-CM | POA: Insufficient documentation

## 2023-06-11 DIAGNOSIS — R4020 Unspecified coma: Secondary | ICD-10-CM | POA: Insufficient documentation

## 2023-06-11 DIAGNOSIS — R000 Tachycardia, unspecified: Secondary | ICD-10-CM | POA: Insufficient documentation

## 2023-06-11 DIAGNOSIS — E0590 Thyrotoxicosis, unspecified without thyrotoxic crisis or storm: Secondary | ICD-10-CM

## 2023-06-11 DIAGNOSIS — F902 Attention-deficit hyperactivity disorder, combined type: Secondary | ICD-10-CM

## 2023-06-11 DIAGNOSIS — Z23 Encounter for immunization: Secondary | ICD-10-CM | POA: Insufficient documentation

## 2023-06-11 HISTORY — DX: Essential (primary) hypertension: I10

## 2023-06-11 LAB — POCT GLYCOSYLATED HEMOGLOBIN (HGB A1C): Hemoglobin A1C: 5.1 % (ref 4.0–5.6)

## 2023-06-11 NOTE — Assessment & Plan Note (Signed)
Continue Focalin 20 mg daily,  Encouraged to maintain close follow-up with a psychiatrist and therapist

## 2023-06-11 NOTE — Assessment & Plan Note (Signed)
BP Readings from Last 3 Encounters:  06/11/23 126/65  04/14/23 115/63  04/11/23 135/86   HTN Controlled .  On atenolol 50 mg daily Continue current medications. No changes in management. Discussed DASH diet and dietary sodium restrictions Encouraged to increase dietary efforts and exercise.

## 2023-06-11 NOTE — Patient Instructions (Signed)
1. Hyperthyroidism  - Ambulatory referral to Endocrinology - POCT glycosylated hemoglobin (Hb A1C)  2. Need for influenza vaccination   3. Primary hypertension   4. Loss of consciousness (HCC)  - Ambulatory referral to Cardiology    It is important that you exercise regularly at least 30 minutes 5 times a week as tolerated  Think about what you will eat, plan ahead. Choose " clean, green, fresh or frozen" over canned, processed or packaged foods which are more sugary, salty and fatty. 70 to 75% of food eaten should be vegetables and fruit. Three meals at set times with snacks allowed between meals, but they must be fruit or vegetables. Aim to eat over a 12 hour period , example 7 am to 7 pm, and STOP after  your last meal of the day. Drink water,generally about 64 ounces per day, no other drink is as healthy. Fruit juice is best enjoyed in a healthy way, by EATING the fruit.  Thanks for choosing [facility] we consider it a privelige to serve you.

## 2023-06-11 NOTE — Assessment & Plan Note (Signed)
Had 2 episodes of loss of consciousness recently Could have be due to her untreated hyperthyroidism. Records of ED visits not available on epic Will refer the patient to cardiology to rule out any cardiac disorder that could have led to her loss of consciousness.  Needs to see evaluation done in order for her to continue her job training at [employer].

## 2023-06-11 NOTE — Assessment & Plan Note (Signed)
Patient educated on CDC recommendation for the vaccine. Verbal consent was obtained from the patient, vaccine administered by nurse, no sign of adverse reactions noted at this time. Patient education on arm soreness and use of tylenol  for this patient  was discussed.

## 2023-06-11 NOTE — Assessment & Plan Note (Signed)
Continue methimazole 10 mg daily Patient referred to endocrinologist

## 2023-06-11 NOTE — Assessment & Plan Note (Signed)
Continue Lexapro 20 mg daily, Vraylar 3 mg daily Patient encouraged to maintain close follow-up with psychiatrist and therapist

## 2023-06-11 NOTE — Progress Notes (Signed)
New Patient Office Visit  Subjective:  Patient ID: Alison Cummings, female    DOB: 02-01-2001  Age: 22 y.o. MRN: [id number]  CC:  Chief Complaint  Patient presents with   Establish Care    HPI ADRIONNA Cummings is a 22 y.o. female  has a past medical history of ADHD (attention deficit hyperactivity disorder), ADHD (attention deficit hyperactivity disorder), combined type (08/31/2013), Adjustment disorder with mixed disturbance of emotions and conduct (08/03/2018), Anxiety, Depression, Episodic mood disorder (HCC) (08/31/2013), High blood pressure (06/11/2023), Hypertension, Hyperthyroidism (06/11/2023), Obesity, Seasonal allergies, and Seizures (HCC).  Patient presents to establish care for her chronic medical conditions  Hypertension/ tachycardia.  Currently on atenolol 50 mg daily.  She reported intermittent chest pain no shortness of breath, edema.  Hypothyroidism.  Stated that she was taken to one emergency room in [place] after she passed out twice on 2 different days while attending a training at [employer] in [place]. 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  Stated that at the hospital her blood pressure and heart rate was elevated, labs done showed that she had hyperthyroidism.  She was started on atenolol 50 mg daily and methimazole 10 mg daily.  Stated that she has been taking both medications for about a week.  Also notes patient was diagnosed with hypothyroidism on 04/10/2023 at the emergency department but she did not start taking medication until her recent ED visit in [place].   ADHD /anxiety and depression on Focalin 10 mg daily, Lexapro 10 mg daily, Vraylar 3 mg daily. she sees the psychiatrist and therapist every 2 weeks at [facility] in [place], patient denies SI, HI.  patient reports history of seizures stated that she has not had any seizure episodes since her early childhood years.  Previous PCP Filomena Jungling NP at [facility] in [place], her  last visit today was a year ago    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   IMO SNOMED Dx Update Oct 2024     High blood pressure 06/11/2023   Hypertension    Hyperthyroidism 06/11/2023   Obesity    Seasonal allergies    Seizures (HCC)     Past Surgical History:  Procedure Laterality Date   ADENOIDECTOMY      Family History  Problem Relation Age of Onset   Schizophrenia Mother    High blood pressure Mother    Hyperlipidemia Mother    High blood pressure Father    ADD / ADHD Father    Bipolar disorder Father    Stroke Father    Diabetes Sister    Hyperlipidemia Sister     Social History   Socioeconomic History   Marital status: Single    Spouse name: Not on file   Number of children: Not on file   Years of education: Not on file   Highest education level: Not on file  Occupational History   Not on file  Tobacco Use   Smoking status: Never   Smokeless tobacco: Never  Substance and Sexual Activity   Alcohol use: No   Drug use: Yes    Types: Marijuana    Comment: sometimes   Sexual activity: Yes  Other Topics Concern  Not on file  Social History Narrative   Lives with her mother and grandmother    Social Determinants of Health   Financial Resource Strain: Not on file  Food Insecurity: Food Insecurity Present (04/10/2023)   Hunger Vital Sign    Worried About Running Out of Food in the Last Year: Sometimes true    Ran Out of Food in the Last Year: Sometimes true  Transportation Needs: No Transportation Needs (04/10/2023)   PRAPARE - Administrator, Civil Service (Medical): No    Lack of Transportation (Non-Medical): No  Physical Activity: Not on file  Stress: Not on file   Social Connections: Not on file  Intimate Partner Violence: Not At Risk (04/10/2023)   Humiliation, Afraid, Rape, and Kick questionnaire    Fear of Current or Ex-Partner: No    Emotionally Abused: No    Physically Abused: No    Sexually Abused: No    ROS Review of Systems  Constitutional:  Negative for activity change, appetite change, chills, fatigue and fever.  HENT:  Negative for congestion, dental problem, ear discharge, ear pain and hearing loss.   Respiratory:  Negative for cough, chest tightness, shortness of breath and wheezing.   Cardiovascular:  Positive for chest pain and palpitations. Negative for leg swelling.  Gastrointestinal:  Negative for abdominal distention, abdominal pain, anal bleeding, blood in stool, constipation, diarrhea, nausea, rectal pain and vomiting.  Endocrine: Negative for cold intolerance, heat intolerance, polydipsia, polyphagia and polyuria.  Genitourinary:  Negative for difficulty urinating, dysuria, flank pain, frequency, hematuria, menstrual problem, pelvic pain and vaginal bleeding.  Musculoskeletal:  Negative for arthralgias, back pain, gait problem, joint swelling and myalgias.  Skin:  Negative for color change, pallor, rash and wound.  Allergic/Immunologic: Negative for immunocompromised state.  Neurological:  Positive for syncope. Negative for dizziness, tremors, facial asymmetry, weakness and headaches.  Hematological:  Negative for adenopathy. Does not bruise/bleed easily.  Psychiatric/Behavioral:  Negative for agitation, behavioral problems, confusion, decreased concentration, hallucinations, self-injury and suicidal ideas.     Objective:   Today's Vitals: BP 126/65   Pulse (!) 107   Temp 97.7 F (36.5 C)   Ht 5\' 2"  (1.575 m)   Wt 213 lb (96.6 kg)   SpO2 100%   BMI 38.96 kg/m   Physical Exam Vitals and nursing note reviewed.  Constitutional:      General: She is not in acute distress.    Appearance: Normal appearance. She is  obese. She is not ill-appearing, toxic-appearing or diaphoretic.  HENT:     Mouth/Throat:     Mouth: Mucous membranes are moist.     Pharynx: Oropharynx is clear. No oropharyngeal exudate or posterior oropharyngeal erythema.  Eyes:     General: No scleral icterus.       Right eye: No discharge.        Left eye: No discharge.     Extraocular Movements: Extraocular movements intact.     Conjunctiva/sclera: Conjunctivae normal.  Cardiovascular:     Rate and Rhythm: Normal rate and regular rhythm.     Pulses: Normal pulses.     Heart sounds: Normal heart sounds. No murmur heard.    No friction rub. No gallop.  Pulmonary:     Effort: Pulmonary effort is normal. No respiratory distress.     Breath sounds: Normal breath sounds. No stridor. No wheezing, rhonchi or rales.  Chest:     Chest wall: No tenderness.  Abdominal:     General: There is  no distension.     Palpations: Abdomen is soft.     Tenderness: There is no abdominal tenderness. There is no right CVA tenderness, left CVA tenderness or guarding.  Musculoskeletal:        General: No swelling, tenderness, deformity or signs of injury.     Right lower leg: No edema.     Left lower leg: No edema.  Skin:    General: Skin is warm and dry.     Capillary Refill: Capillary refill takes less than 2 seconds.     Coloration: Skin is not jaundiced or pale.     Findings: No bruising, erythema or lesion.  Neurological:     Mental Status: She is alert and oriented to person, place, and time.     Motor: No weakness.     Coordination: Coordination normal.     Gait: Gait normal.  Psychiatric:        Mood and Affect: Mood normal.        Behavior: Behavior normal.        Thought Content: Thought content normal.        Judgment: Judgment normal.     Assessment & Plan:   Problem List Items Addressed This Visit       Cardiovascular and Mediastinum   High blood pressure    BP Readings from Last 3 Encounters:  06/11/23 126/65  04/14/23  115/63  04/11/23 135/86   HTN Controlled .  On atenolol 50 mg daily Continue current medications. No changes in management. Discussed DASH diet and dietary sodium restrictions Encouraged to increase dietary efforts and exercise.         Relevant Medications   atenolol (TENORMIN) 50 MG tablet     Endocrine   Hyperthyroidism - Primary    Continue methimazole 10 mg daily Patient referred to endocrinologist      Relevant Medications   methimazole (TAPAZOLE) 10 MG tablet   atenolol (TENORMIN) 50 MG tablet   Other Relevant Orders   Ambulatory referral to Endocrinology   POCT glycosylated hemoglobin (Hb A1C) (Completed)     Other   ADHD (attention deficit hyperactivity disorder), combined type    Continue Focalin 20 mg daily,  Encouraged to maintain close follow-up with a psychiatrist and therapist      Episodic mood disorder (HCC)    Continue Lexapro 20 mg daily, Vraylar 3 mg daily Patient encouraged to maintain close follow-up with psychiatrist and therapist       Need for influenza vaccination    Patient educated on CDC recommendation for the vaccine. Verbal consent was obtained from the patient, vaccine administered by nurse, no sign of adverse reactions noted at this time. Patient education on arm soreness and use of tylenol  for this patient  was discussed.       Relevant Orders   Flu vaccine trivalent PF, 6mos and older(Flulaval,Afluria,Fluarix,Fluzone) (Completed)   Tachycardia    Continue atenolol 50 mg daily .      Loss of consciousness (HCC)    Had 2 episodes of loss of consciousness recently Could have be due to her untreated hyperthyroidism. Records of ED visits not available on epic Will refer the patient to cardiology to rule out any cardiac disorder that could have led to her loss of consciousness.  Needs to see evaluation done in order for her to continue her job training at [employer].      Relevant Orders   Ambulatory referral to Cardiology     Outpatient  Encounter Medications as of 06/11/2023  Medication Sig   acetaminophen (TYLENOL) 500 MG tablet Take 500 mg by mouth every 6 (six) hours as needed.   atenolol (TENORMIN) 50 MG tablet Take 50 mg by mouth daily.   escitalopram (LEXAPRO) 20 MG tablet Take 20 mg by mouth daily.   FOCALIN XR 20 MG 24 hr capsule Take 20 mg by mouth daily.   methimazole (TAPAZOLE) 10 MG tablet Take 10 mg by mouth daily.   VRAYLAR 3 MG capsule Take 3 mg by mouth at bedtime.   dexmethylphenidate (FOCALIN XR) 10 MG 24 hr capsule Take 1 capsule by mouth every morning. (Patient not taking: Reported on 06/11/2023)   metFORMIN (GLUCOPHAGE) 1000 MG tablet Take 1,000 mg by mouth daily. (Patient not taking: Reported on 06/11/2023)   [DISCONTINUED] escitalopram (LEXAPRO) 10 MG tablet Take 10 mg by mouth daily. (Patient not taking: Reported on 06/11/2023)   [DISCONTINUED] metoprolol tartrate (LOPRESSOR) 50 MG tablet Take 1 tablet (50 mg total) by mouth 2 (two) times daily. (Patient not taking: Reported on 06/11/2023)   [DISCONTINUED] metroNIDAZOLE (FLAGYL) 250 MG tablet Take 1 tablet (250 mg total) by mouth 2 (two) times daily. (Patient not taking: Reported on 06/11/2023)   No facility-administered encounter medications on file as of 06/11/2023.    Follow-up: Return in about 3 months (around 09/11/2023).   Donell Beers, FNP

## 2023-06-11 NOTE — Assessment & Plan Note (Addendum)
Continue atenolol 50 mg daily.

## 2023-06-18 DIAGNOSIS — F411 Generalized anxiety disorder: Secondary | ICD-10-CM | POA: Diagnosis not present

## 2023-06-18 DIAGNOSIS — Z5181 Encounter for therapeutic drug level monitoring: Secondary | ICD-10-CM | POA: Diagnosis not present

## 2023-06-27 DIAGNOSIS — J351 Hypertrophy of tonsils: Secondary | ICD-10-CM | POA: Diagnosis not present

## 2023-07-01 ENCOUNTER — Ambulatory Visit: Payer: 59 | Admitting: "Endocrinology

## 2023-07-02 ENCOUNTER — Ambulatory Visit: Payer: 59 | Admitting: Nurse Practitioner

## 2023-07-05 ENCOUNTER — Ambulatory Visit (INDEPENDENT_AMBULATORY_CARE_PROVIDER_SITE_OTHER): Payer: 59

## 2023-07-05 ENCOUNTER — Encounter (HOSPITAL_COMMUNITY): Payer: Self-pay | Admitting: Emergency Medicine

## 2023-07-05 ENCOUNTER — Ambulatory Visit (HOSPITAL_COMMUNITY)
Admission: EM | Admit: 2023-07-05 | Discharge: 2023-07-05 | Disposition: A | Discharge status: Home / Self Care | Payer: 59 | Attending: Family Medicine | Admitting: Family Medicine

## 2023-07-05 DIAGNOSIS — R042 Hemoptysis: Secondary | ICD-10-CM | POA: Diagnosis not present

## 2023-07-05 DIAGNOSIS — R051 Acute cough: Secondary | ICD-10-CM | POA: Diagnosis not present

## 2023-07-05 DIAGNOSIS — R059 Cough, unspecified: Secondary | ICD-10-CM | POA: Diagnosis not present

## 2023-07-05 LAB — POC COVID19/FLU A&B COMBO
Covid Antigen, POC: NEGATIVE
Influenza A Antigen, POC: NEGATIVE
Influenza B Antigen, POC: NEGATIVE

## 2023-07-05 MED ORDER — PROMETHAZINE-DM 6.25-15 MG/5ML PO SYRP
5.0000 mL | ORAL_SOLUTION | Freq: Four times a day (QID) | ORAL | 0 refills | Status: DC | PRN
Start: 2023-07-05 — End: 2023-08-30

## 2023-07-05 NOTE — ED Triage Notes (Signed)
Pt c/o cough and headache that started this morning. She coughed up blood this am   Denies any other symptoms

## 2023-07-07 NOTE — ED Provider Notes (Signed)
[facility]   [id number] 07/05/23 Arrival Time: 1314  ASSESSMENT & PLAN:  1. Acute cough   2. Blood in sputum    I have personally viewed and independently interpreted the imaging studies ordered this visit. CXR: no acute changes.  Does not cough up blood here. Discussed typical duration of likely viral illness. Results for orders placed or performed during the hospital encounter of 07/05/23  POC Covid + Flu A/B Antigen  Result Value Ref Range   Influenza A Antigen, POC Negative Negative   Influenza B Antigen, POC Negative Negative   Covid Antigen, POC Negative Negative    OTC symptom care as needed.  Discharge Medication List as of 07/05/2023  2:53 PM     START taking these medications   Details  promethazine-dextromethorphan (PROMETHAZINE-DM) 6.25-15 MG/5ML syrup Take 5 mLs by mouth 4 (four) times daily as needed for cough., Starting Sat 07/05/2023, Normal         Follow-up Information     [facility].   Specialty: Emergency Medicine Why: If symptoms worsen in any way. Contact information: [address] [place] [address] [phone]        Donell Beers, FNP.   Specialty: Nurse Practitioner Why: Schedule an appointment for follow up. Contact information: [address] [place], [place] [address] [phone]                 Reviewed expectations re: course of current medical issues. Questions answered. Outlined signs and symptoms indicating need for more acute intervention. Understanding verbalized. After Visit Summary given.   SUBJECTIVE: History from: Patient. Alison Cummings is a 22 y.o. female. Reports cough and headache that started this morning. She coughed up mucous with blood this am. None since. No h/o lung dz. Denies fever/SOB/CP.  Denies any other symptoms Normal PO intake without n/v/d.  OBJECTIVE:  Vitals:   07/05/23 1347   BP: 113/83  Pulse: 90  Resp: 17  Temp: 98.1 F (36.7 C)  TempSrc: Oral  SpO2: 98%    General appearance: alert; no distress Eyes: PERRLA; EOMI; conjunctiva normal HENT: [place]; AT; with nasal congestion Neck: supple  Lungs: speaks full sentences without difficulty; unlabored; ctab Extremities: no edema Skin: warm and dry Neurologic: normal gait Psychological: alert and cooperative; normal mood and affect  Labs: Results for orders placed or performed during the hospital encounter of 07/05/23  POC Covid + Flu A/B Antigen  Result Value Ref Range   Influenza A Antigen, POC Negative Negative   Influenza B Antigen, POC Negative Negative   Covid Antigen, POC Negative Negative   Labs Reviewed  POC COVID19/FLU A&B COMBO    Imaging: DG Chest 2 View  Result Date: 07/05/2023 CLINICAL DATA:  Coughing up blood. EXAM: CHEST - 2 VIEW COMPARISON:  X-ray 06/13/2022 FINDINGS: No consolidation, pneumothorax or effusion. No edema. Normal cardiopericardial silhouette. IMPRESSION: No acute cardiopulmonary disease. Electronically Signed   By: Karen Kays M.D.   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   Anxiety    Depression    Episodic mood disorder (HCC) 08/31/2013   IMO SNOMED Dx Update Oct 2024     High blood pressure 06/11/2023   Hypertension    Hyperthyroidism 06/11/2023   Obesity    Seasonal allergies    Seizures (HCC)    Social History   Socioeconomic History   Marital status: Single    Spouse name: Not on file    Number of children: Not on file   Years of education: Not on file   Highest education level: Not on file  Occupational History   Not on file  Tobacco Use   Smoking status: Never   Smokeless tobacco: Never  Substance and Sexual Activity   Alcohol use: No   Drug use: Yes    Types: Marijuana    Comment: sometimes   Sexual activity: Yes  Other Topics Concern   Not on file  Social History Narrative   Lives with her mother and grandmother    Social Determinants of Health   Financial Resource Strain: Not on file  Food Insecurity: Food Insecurity Present (04/10/2023)   Hunger Vital Sign    Worried About Running Out of Food in the Last Year: Sometimes true    Ran Out of Food in the Last Year: Sometimes true  Transportation Needs: No Transportation Needs (04/10/2023)   PRAPARE - Administrator, Civil Service (Medical): No    Lack of Transportation (Non-Medical): No  Physical Activity: Not on file  Stress: Not on file  Social Connections: Not on file  Intimate Partner Violence: Not At Risk (04/10/2023)   Humiliation, Afraid, Rape, and Kick questionnaire    Fear of Current or Ex-Partner: No    Emotionally Abused: No    Physically Abused: No    Sexually Abused: No   Family History  Problem Relation Age of Onset   Schizophrenia Mother    High blood pressure Mother    Hyperlipidemia Mother    High blood pressure Father    ADD / ADHD Father    Bipolar disorder Father    Stroke Father    Diabetes Sister    Hyperlipidemia Sister    Past Surgical History:  Procedure Laterality Date   ADENOIDECTOMY       Mardella Layman, MD 07/07/23 [id number]

## 2023-07-08 ENCOUNTER — Other Ambulatory Visit: Payer: Self-pay

## 2023-07-14 ENCOUNTER — Other Ambulatory Visit: Payer: Self-pay

## 2023-07-14 ENCOUNTER — Other Ambulatory Visit (HOSPITAL_COMMUNITY): Payer: Self-pay | Admitting: Nurse Practitioner

## 2023-07-14 MED ORDER — ATENOLOL 50 MG PO TABS: 50.0000 mg | ORAL_TABLET | Freq: Every day | ORAL | 0 refills | Status: DC

## 2023-07-14 MED ORDER — METHIMAZOLE 10 MG PO TABS: 10.0000 mg | ORAL_TABLET | Freq: Every day | ORAL | 1 refills | Status: DC

## 2023-07-14 MED ORDER — METHIMAZOLE 10 MG PO TABS: 10.0000 mg | ORAL_TABLET | Freq: Every day | ORAL | 0 refills | Status: DC

## 2023-07-14 NOTE — Telephone Encounter (Signed)
Copied from CRM [id number]. Topic: Clinical - Medication Refill >> Jul 14, 2023 11:15 AM Almira Coaster wrote: Most Recent Primary Care Visit:  Provider: Donell Beers  Department[facility]  Visit Type: NEW PATIENT  Date: 06/11/2023  Medication: atenolol (TENORMIN) 50 MG tablet & methimazole (TAPAZOLE) 10 MG tablet  Has the patient contacted their pharmacy? No (Agent: If no, request that the patient contact the pharmacy for the refill. If patient does not wish to contact the pharmacy document the reason why and proceed with request.) (Agent: If yes, when and what did the pharmacy advise?)  Is this the correct pharmacy for this prescription? Yes If no, delete pharmacy and type the correct one.  This is the patient's preferred pharmacy:  [facility] [address] [place] - [address] [place] [address] Phone: [phone] Fax: [phone]   Has the prescription been filled recently? No  Is the patient out of the medication? Yes  Has the patient been seen for an appointment in the last year OR does the patient have an upcoming appointment? Yes  Can we respond through MyChart? Yes  Agent: Please be advised that Rx refills may take up to 3 business days. We ask that you follow-up with your pharmacy.

## 2023-07-17 ENCOUNTER — Encounter: Payer: Self-pay | Admitting: Cardiovascular Disease

## 2023-07-17 ENCOUNTER — Ambulatory Visit: Payer: 59 | Attending: Cardiovascular Disease

## 2023-07-17 ENCOUNTER — Other Ambulatory Visit: Payer: Self-pay | Admitting: Cardiovascular Disease

## 2023-07-17 ENCOUNTER — Ambulatory Visit: Payer: 59 | Attending: Cardiovascular Disease | Admitting: Cardiovascular Disease

## 2023-07-17 VITALS — BP 116/78 | HR 119 | Ht 62.0 in | Wt 212.0 lb

## 2023-07-17 DIAGNOSIS — R718 Other abnormality of red blood cells: Secondary | ICD-10-CM

## 2023-07-17 DIAGNOSIS — R000 Tachycardia, unspecified: Secondary | ICD-10-CM | POA: Diagnosis not present

## 2023-07-17 DIAGNOSIS — R4020 Unspecified coma: Secondary | ICD-10-CM

## 2023-07-17 DIAGNOSIS — F902 Attention-deficit hyperactivity disorder, combined type: Secondary | ICD-10-CM | POA: Diagnosis not present

## 2023-07-17 DIAGNOSIS — R002 Palpitations: Secondary | ICD-10-CM

## 2023-07-17 DIAGNOSIS — F4325 Adjustment disorder with mixed disturbance of emotions and conduct: Secondary | ICD-10-CM | POA: Diagnosis not present

## 2023-07-17 DIAGNOSIS — E0590 Thyrotoxicosis, unspecified without thyrotoxic crisis or storm: Secondary | ICD-10-CM | POA: Diagnosis not present

## 2023-07-17 DIAGNOSIS — R55 Syncope and collapse: Secondary | ICD-10-CM

## 2023-07-17 DIAGNOSIS — E66812 Obesity, class 2: Secondary | ICD-10-CM

## 2023-07-17 DIAGNOSIS — R7989 Other specified abnormal findings of blood chemistry: Secondary | ICD-10-CM

## 2023-07-17 MED ORDER — ATENOLOL 50 MG PO TABS: 50.0000 mg | ORAL_TABLET | Freq: Every day | ORAL | 0 refills | Status: DC

## 2023-07-17 NOTE — Patient Instructions (Addendum)
Medication Instructions:  Begin Atenolol 50mg . Take one tablet daily  Monitor your heart rate.  If you find that your heart rate is above 100bpm, you may take 1 tablet and a half (75mg ).   *If you need a refill on your cardiac medications before your next appointment, please call your pharmacy*   Lab Work: Have your Endocrinologist check these labs CMET, CBC, IRON, LIPID If you have labs (blood work) drawn today and your tests are completely normal, you will receive your results only by: MyChart Message (if you have MyChart) OR A paper copy in the mail If you have any lab test that is abnormal or we need to change your treatment, we will call you to review the results.   Testing/Procedures: Your physician has requested that you have an echocardiogram. Echocardiography is a painless test that uses sound waves to create images of your heart. It provides your doctor with information about the size and shape of your heart and how well your heart's chambers and valves are working. This procedure takes approximately one hour. There are no restrictions for this procedure. Please do NOT wear cologne, perfume, aftershave, or lotions (deodorant is allowed). Please arrive 15 minutes prior to your appointment time.  Please note: We ask at that you not bring children with you during ultrasound (echo/ vascular) testing. Due to room size and safety concerns, children are not allowed in the ultrasound rooms during exams. Our front office staff cannot provide observation of children in our lobby area while testing is being conducted. An adult accompanying a patient to their appointment will only be allowed in the ultrasound room at the discretion of the ultrasound technician under special circumstances. We apologize for any inconvenience.    ZIO XT- Long Term Monitor Instructions   Your physician has requested you wear your ZIO patch monitor___14____days.   This is a single patch monitor.  [employer]  supplies one patch monitor per enrollment.  Additional stickers are not available.   Please do not apply patch if you will be having a Nuclear Stress Test, Echocardiogram, Cardiac CT, MRI, or Chest Xray during the time frame you would be wearing the monitor. The patch cannot be worn during these tests.  You cannot remove and re-apply the ZIO XT patch monitor.   Your ZIO patch monitor will be sent USPS Priority mail from [facility] directly to your home address. The monitor may also be mailed to a PO BOX if home delivery is not available.   It may take 3-5 days to receive your monitor after you have been enrolled.   Once you have received you monitor, please review enclosed instructions.  Your monitor has already been registered assigning a specific monitor serial # to you.   Applying the monitor   Shave hair from upper left chest.   Hold abrader disc by orange tab.  Rub abrader in 40 strokes over left upper chest as indicated in your monitor instructions.   Clean area with 4 enclosed alcohol pads .  Use all pads to assure are is cleaned thoroughly.  Let dry.   Apply patch as indicated in monitor instructions.  Patch will be place under collarbone on left side of chest with arrow pointing upward.   Rub patch adhesive wings for 2 minutes.Remove white label marked "1".  Remove white label marked "2".  Rub patch adhesive wings for 2 additional minutes.   While looking in a mirror, press and release button in center of patch.  A  small green light will flash 3-4 times .  This will be your only indicator the monitor has been turned on.     Do not shower for the first 24 hours.  You may shower after the first 24 hours.   Press button if you feel a symptom. You will hear a small click.  Record Date, Time and Symptom in the Patient Log Book.   When you are ready to remove patch, follow instructions on last 2 pages of Patient Log Book.  Stick patch monitor onto last page of Patient Log  Book.   Place Patient Log Book in [place] box.  Use locking tab on box and tape box closed securely.  The [employer] and [employer] has [employer] on it.  Please place in mailbox as soon as possible.  Your physician should have your test results approximately 7 days after the monitor has been mailed back to [facility].   Call [facility] Customer Care at [phone] if you have questions regarding your ZIO XT patch monitor.  Call them immediately if you see an orange light blinking on your monitor.   If your monitor falls off in less than 4 days contact our Monitor department at [phone].  If your monitor becomes loose or falls off after 4 days call [employer] at [phone] for suggestions on securing your monitor.     Follow-Up: At [facility], you and your health needs are our priority.  As part of our continuing mission to provide you with exceptional heart care, we have created designated Provider Care Teams.  These Care Teams include your primary Cardiologist (physician) and Advanced Practice Providers (APPs -  Physician Assistants and Nurse Practitioners) who all work together to provide you with the care you need, when you need it.  We recommend signing up for the patient portal called "MyChart".  Sign up information is provided on this After Visit Summary.  MyChart is used to connect with patients for Virtual Visits (Telemedicine).  Patients are able to view lab/test results, encounter notes, upcoming appointments, etc.  Non-urgent messages can be sent to your provider as well.   To learn more about what you can do with MyChart, go to [email or web address].    Your next appointment:   6-8 week(s)  Provider:   Bernadene Person, NP

## 2023-07-17 NOTE — Progress Notes (Signed)
Cardiology Office Note    Date:  07/19/2023   ID:  Alison Cummings, DOB 02/12/2001, MRN [id number]  PCP:  Donell Beers, FNP  Cardiologist:  Nicki Guadalajara, MD   New cardiology consultation referred by Edwin Dada, FNP for evaluation of syncope   History of Present Illness:  Alison Cummings is a 22 y.o. female who has a history of attention deficit hyperactivity disorder, anxiety/depression, hypertension, obesity, recently diagnosed to be hyperthyroid and seizure activity.  She apparently has been undergoing training at job [employer] in the state of [place] and apparently experience 2 episodes of passing out on 2 different days.  Apparently she underwent hospital evaluation and heart rate was elevated and laboratory revealed that she was hyperthyroid.  She subsequently has been started on atenolol 50 mg and methimazole 10 mg daily.  With her 2 episodes of loss of consciousness, she is now referred for cardiology evaluation.  Presently she denies any chest pain or significant shortness of breath.  She is on a medical regimen of atenolol 50 mg, but ran out of this yesterday, she recently was started on metformin 1000 mg daily, she is on methimazole 10 mg daily, takes Lexapro for bipolar issues, and is on Focalin XR for ADHD.  She also takes Vraylar 1.5 mg capsule daily.   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   IMO SNOMED Dx Update Oct 2024     High blood pressure 06/11/2023   Hypertension    Hyperthyroidism 06/11/2023   Obesity    Seasonal allergies    Seizures (HCC)     Past Surgical History:  Procedure Laterality Date   ADENOIDECTOMY      Current Medications: Outpatient Medications  Prior to Visit  Medication Sig Dispense Refill   acetaminophen (TYLENOL) 500 MG tablet Take 500 mg by mouth every 6 (six) hours as needed.     dexmethylphenidate (FOCALIN XR) 10 MG 24 hr capsule Take 1 capsule by mouth every morning.     escitalopram (LEXAPRO) 20 MG tablet Take 20 mg by mouth daily.     FOCALIN XR 20 MG 24 hr capsule Take 20 mg by mouth daily.     metFORMIN (GLUCOPHAGE) 1000 MG tablet Take 1,000 mg by mouth daily.     methimazole (TAPAZOLE) 10 MG tablet Take 1 tablet (10 mg total) by mouth daily. 30 tablet 1   promethazine-dextromethorphan (PROMETHAZINE-DM) 6.25-15 MG/5ML syrup Take 5 mLs by mouth 4 (four) times daily as needed for cough. 118 mL 0   VRAYLAR 1.5 MG capsule Take 1.5 mg by mouth daily.     atenolol (TENORMIN) 50 MG tablet Take 1 tablet (50 mg total) by mouth daily. 90 tablet 0   No facility-administered medications prior to visit.     Allergies:   Apple, Cherry, and Ibuprofen   Social History   Socioeconomic History   Marital status: Single    Spouse name: Not on file   Number of children: Not on file   Years of education: Not on file   Highest education level: Not on file  Occupational History   Not on file  Tobacco Use   Smoking status: Never   Smokeless tobacco: Never  Substance  and Sexual Activity   Alcohol use: No   Drug use: Yes    Types: Marijuana    Comment: sometimes   Sexual activity: Yes  Other Topics Concern   Not on file  Social History Narrative   Lives with her mother and grandmother    Social Drivers of Health   Financial Resource Strain: Not on file  Food Insecurity: Food Insecurity Present (04/10/2023)   Hunger Vital Sign    Worried About Running Out of Food in the Last Year: Sometimes true    Ran Out of Food in the Last Year: Sometimes true  Transportation Needs: No Transportation Needs (04/10/2023)   PRAPARE - Administrator, Civil Service (Medical): No    Lack of Transportation (Non-Medical): No  Physical  Activity: Not on file  Stress: Not on file  Social Connections: Not on file    Socially she is single.  Currently she is on medical leave and has been working at [employer] as a Lawyer.  There is no tobacco or alcohol history.  She does not routinely exercise.  Family History:  The patient's family history includes ADD / ADHD in her father; Bipolar disorder in her father; Diabetes in her sister; High blood pressure in her father and mother; Hyperlipidemia in her mother and sister; Schizophrenia in her mother; Stroke in her father.   ROS General: Negative; No fevers, chills, or night sweats; obesity HEENT: Negative; No changes in vision or hearing, sinus congestion, difficulty swallowing Pulmonary: Negative; No cough, wheezing, shortness of breath, hemoptysis Cardiovascular: Negative; No chest pain, presyncope, syncope, palpitations GI: Negative; No nausea, vomiting, diarrhea, or abdominal pain GU: Negative; No dysuria, hematuria, or difficulty voiding Musculoskeletal: Negative; no myalgias, joint pain, or weakness Hematologic/Oncology: Negative; no easy bruising, bleeding Endocrine: Recently diagnosed with hyperthyroidism Neuro: Negative; no changes in balance, headaches Skin: Negative; No rashes or skin lesions Psychiatric: Anxiety, ADHD. Sleep: Negative; No snoring, daytime sleepiness, hypersomnolence, bruxism, restless legs, hypnogognic hallucinations, no cataplexy Other comprehensive 14 point system review is negative.   PHYSICAL EXAM:   VS:  BP 116/78 (BP Location: Left Arm, Cuff Size: Large)   Pulse (!) 119   Ht 5\' 2"  (1.575 m)   Wt 212 lb (96.2 kg)   LMP 05/22/2023 (Exact Date)   SpO2 98%   BMI 38.78 kg/m     Repeat blood pressure by me was 124/78  Wt Readings from Last 3 Encounters:  07/17/23 212 lb (96.2 kg)  06/11/23 213 lb (96.6 kg)  04/10/23 240 lb (108.9 kg)    General: Alert, oriented, no distress.  Skin: normal turgor, no rashes, warm and dry HEENT:  Normocephalic, atraumatic. Pupils equal round and reactive to light; sclera anicteric; extraocular muscles intact; Nose without nasal septal hypertrophy Mouth/Parynx benign; Mallinpatti scale 3 Neck: Thick neck; no JVD, no carotid bruits; normal carotid upstroke Lungs: clear to ausculatation and percussion; no wheezing or rales Chest wall: without tenderness to palpitation Heart: PMI not displaced, RRR, s1 s2 normal, 1/6 systolic murmur, no diastolic murmur, no rubs, gallops, thrills, or heaves Abdomen: soft, nontender; no hepatosplenomehaly, BS+; abdominal aorta nontender and not dilated by palpation. Back: no CVA tenderness Pulses 2+ Musculoskeletal: full range of motion, normal strength, no joint deformities Extremities: no clubbing cyanosis or edema, Homan's sign negative  Neurologic: grossly nonfocal; Cranial nerves grossly wnl Psychologic: Normal mood and affect   Studies/Labs Reviewed:   July 17, 2023 ECG (independently read by me): Sinus tachycardia 119 bpm.  Minimal voltage  criteria for LVH.  Recent Labs:    Latest Ref Rng & Units 04/10/2023    8:29 PM 04/10/2023   10:43 AM 06/13/2022   10:32 PM  BMP  Glucose 70 - 99 mg/dL 884  84  91   BUN 6 - 20 mg/dL 8  7  16    Creatinine 0.44 - 1.00 mg/dL 1.66  0.63  0.16   Sodium 135 - 145 mmol/L 138  137  140   Potassium 3.5 - 5.1 mmol/L 3.8  4.0  3.4   Chloride 98 - 111 mmol/L 106  103  109   CO2 22 - 32 mmol/L 24  25  26    Calcium 8.9 - 10.3 mg/dL 8.8  9.2  8.6         Latest Ref Rng & Units 04/10/2023    8:29 PM 04/10/2023   10:43 AM 06/13/2022   10:32 PM  Hepatic Function  Total Protein 6.5 - 8.1 g/dL 6.1  6.5  7.0   Albumin 3.5 - 5.0 g/dL 3.0  3.3  3.5   AST 15 - 41 U/L 37  40  24   ALT 0 - 44 U/L 51  59  28   Alk Phosphatase 38 - 126 U/L 68  79  48   Total Bilirubin 0.3 - 1.2 mg/dL 0.5  1.0  0.5   Bilirubin, Direct 0.0 - 0.2 mg/dL   <0.1        Latest Ref Rng & Units 04/10/2023    8:29 PM 04/10/2023    3:15 PM  06/13/2022   10:32 PM  CBC  WBC 4.0 - 10.5 K/uL 5.3  5.3  6.6   Hemoglobin 12.0 - 15.0 g/dL 09.3  23.5  57.3   Hematocrit 36.0 - 46.0 % 34.9  35.1  38.6   Platelets 150 - 400 K/uL 203  216  217    Lab Results  Component Value Date   MCV 76.2 (L) 04/10/2023   MCV 76.3 (L) 04/10/2023   MCV 83.7 06/13/2022   Lab Results  Component Value Date   TSH <0.010 (L) 04/10/2023   Lab Results  Component Value Date   HGBA1C 5.1 06/11/2023     BNP No results found for: "BNP"  ProBNP No results found for: "PROBNP"   Lipid Panel  No results found for: "CHOL", "TRIG", "HDL", "CHOLHDL", "VLDL", "LDLCALC", "LDLDIRECT", "LABVLDL"   RADIOLOGY: DG Chest 2 View Result Date: 07/05/2023 CLINICAL DATA:  Coughing up blood. EXAM: CHEST - 2 VIEW COMPARISON:  X-ray 06/13/2022 FINDINGS: No consolidation, pneumothorax or effusion. No edema. Normal cardiopericardial silhouette. IMPRESSION: No acute cardiopulmonary disease. Electronically Signed   By: Karen Kays M.D.   On: 07/05/2023 14:28     Additional studies/ records that were reviewed today include:   I reviewed records of [facility] center by Edwin Dada, FNP   ASSESSMENT:    1. Tachycardia   2. Loss of consciousness (HCC)   3. ADHD (attention deficit hyperactivity disorder), combined type   4. Adjustment disorder with mixed disturbance of emotions and conduct   5. Hyperthyroidism   6. Obesity, Class II, BMI 35-39.9   7. RBC microcytosis   8. Elevated liver function tests     PLAN:  Ms. Elektra Mauthe is a 22 year old female who has a history of attention deficit hyperactivity disorder, anxiety/depression, as well as hypertension, obesity, seasonal allergies and reported seizures.  She had recently passed out on 2 occasions while in the state of  [place] at job core training.  Apparently she presented to the hospital where she was tachycardic and blood pressure was elevated and laboratory reportedly demonstrated  hyperthyroidism.  Subsequent laboratory on June 10, 2023 showing a free T3 elevated at 3.25.  TSH was <0.01.  She has a history of microcytosis with MCV at 74 and hemoglobin 11.8 hematocrit 36.9.  Lipid studies have shown total cholesterol 130, triglycerides 66, HDL 50, and LDL 67.  Chemistry has demonstrated mild transaminase elevation with ALT at 83 and AST at 49.  Renal function was normal with BUN 10 creatinine 0.58, potassium 4.3 and calcium 9.3.  Her blood pressure today when checked by me is stable at 124/78.  ECG shows sinus tachycardia 119 bpm undoubtedly contributed by her hyperthyroid state.  Apparently, she had run out of her atenolol.  I will renew atenolol 50 mg daily but she may require higher dosing.  On physical exam she is obese with BMI of 38.78.  She has large bilateral tonsils and tells me that she will be undergoing future tonsillectomy.  She has been on treatment for ADHD since second grade and currently takes Focalin XR which may also contribute to increased heart rate.  Presently, I am recommending we initiate evaluation with an echo Doppler study to assess systolic and diastolic function and valvular architecture.  I am scheduling her to wear a 2-week Zio patch monitor.  We will reinitiate atenolol and depending upon response may need dose adjustment.  I will recheck a comprehensive metabolic panel, CBC, and with her microcytic indices will also check iron TIBC and ferritin levels.  She is scheduled to see the endocrinologist on December 27 who will be rechecking thyroid function studies and adjusting treatment for her current hyperthyroid state.  I have recommended that she be seen by Bernadene Person, NP in approximately 6 weeks for follow-up evaluation since I will be out of town at that time and further recommendations will be made.   Medication Adjustments/Labs and Tests Ordered: Current medicines are reviewed at length with the patient today.  Concerns regarding medicines are  outlined above.  Medication changes, Labs and Tests ordered today are listed in the Patient Instructions below. Patient Instructions  Medication Instructions:  Begin Atenolol 50mg . Take one tablet daily  Monitor your heart rate.  If you find that your heart rate is above 100bpm, you may take 1 tablet and a half (75mg ).   *If you need a refill on your cardiac medications before your next appointment, please call your pharmacy*   Lab Work: Have your Endocrinologist check these labs CMET, CBC, IRON, LIPID If you have labs (blood work) drawn today and your tests are completely normal, you will receive your results only by: MyChart Message (if you have MyChart) OR A paper copy in the mail If you have any lab test that is abnormal or we need to change your treatment, we will call you to review the results.   Testing/Procedures: Your physician has requested that you have an echocardiogram. Echocardiography is a painless test that uses sound waves to create images of your heart. It provides your doctor with information about the size and shape of your heart and how well your heart's chambers and valves are working. This procedure takes approximately one hour. There are no restrictions for this procedure. Please do NOT wear cologne, perfume, aftershave, or lotions (deodorant is allowed). Please arrive 15 minutes prior to your appointment time.  Please note: We ask at that you not bring  children with you during ultrasound (echo/ vascular) testing. Due to room size and safety concerns, children are not allowed in the ultrasound rooms during exams. Our front office staff cannot provide observation of children in our lobby area while testing is being conducted. An adult accompanying a patient to their appointment will only be allowed in the ultrasound room at the discretion of the ultrasound technician under special circumstances. We apologize for any inconvenience.    ZIO XT- Long Term Monitor  Instructions   Your physician has requested you wear your ZIO patch monitor___14____days.   This is a single patch monitor.  [employer] supplies one patch monitor per enrollment.  Additional stickers are not available.   Please do not apply patch if you will be having a Nuclear Stress Test, Echocardiogram, Cardiac CT, MRI, or Chest Xray during the time frame you would be wearing the monitor. The patch cannot be worn during these tests.  You cannot remove and re-apply the ZIO XT patch monitor.   Your ZIO patch monitor will be sent USPS Priority mail from [facility] directly to your home address. The monitor may also be mailed to a PO BOX if home delivery is not available.   It may take 3-5 days to receive your monitor after you have been enrolled.   Once you have received you monitor, please review enclosed instructions.  Your monitor has already been registered assigning a specific monitor serial # to you.   Applying the monitor   Shave hair from upper left chest.   Hold abrader disc by orange tab.  Rub abrader in 40 strokes over left upper chest as indicated in your monitor instructions.   Clean area with 4 enclosed alcohol pads .  Use all pads to assure are is cleaned thoroughly.  Let dry.   Apply patch as indicated in monitor instructions.  Patch will be place under collarbone on left side of chest with arrow pointing upward.   Rub patch adhesive wings for 2 minutes.Remove white label marked "1".  Remove white label marked "2".  Rub patch adhesive wings for 2 additional minutes.   While looking in a mirror, press and release button in center of patch.  A small green light will flash 3-4 times .  This will be your only indicator the monitor has been turned on.     Do not shower for the first 24 hours.  You may shower after the first 24 hours.   Press button if you feel a symptom. You will hear a small click.  Record Date, Time and Symptom in the Patient Log Book.   When you are  ready to remove patch, follow instructions on last 2 pages of Patient Log Book.  Stick patch monitor onto last page of Patient Log Book.   Place Patient Log Book in [place] box.  Use locking tab on box and tape box closed securely.  The [employer] has [employer] on it.  Please place in mailbox as soon as possible.  Your physician should have your test results approximately 7 days after the monitor has been mailed back to [facility].   Call [facility] Customer Care at [phone] if you have questions regarding your ZIO XT patch monitor.  Call them immediately if you see an orange light blinking on your monitor.   If your monitor falls off in less than 4 days contact our Monitor department at [phone].  If your monitor becomes loose or falls off after 4  days call [employer] at [phone] for suggestions on securing your monitor.     Follow-Up: At [facility], you and your health needs are our priority.  As part of our continuing mission to provide you with exceptional heart care, we have created designated Provider Care Teams.  These Care Teams include your primary Cardiologist (physician) and Advanced Practice Providers (APPs -  Physician Assistants and Nurse Practitioners) who all work together to provide you with the care you need, when you need it.  We recommend signing up for the patient portal called "MyChart".  Sign up information is provided on this After Visit Summary.  MyChart is used to connect with patients for Virtual Visits (Telemedicine).  Patients are able to view lab/test results, encounter notes, upcoming appointments, etc.  Non-urgent messages can be sent to your provider as well.   To learn more about what you can do with MyChart, go to [email or web address].    Your next appointment:   6-8 week(s)  Provider:   Bernadene Person, NP           Signed, Nicki Guadalajara, MD  07/19/2023 12:03 PM    [facility] [address], [place], [place]  [address] Phone: [phone]

## 2023-07-17 NOTE — Progress Notes (Unsigned)
Enrolled for [employer] to mail a ZIO XT long term holter monitor to the patients address on file.  

## 2023-07-18 ENCOUNTER — Ambulatory Visit (HOSPITAL_COMMUNITY): Payer: 59 | Attending: Cardiovascular Disease

## 2023-07-19 ENCOUNTER — Encounter: Payer: Self-pay | Admitting: Cardiovascular Disease

## 2023-07-21 ENCOUNTER — Encounter (HOSPITAL_COMMUNITY): Payer: Self-pay | Admitting: Cardiovascular Disease

## 2023-07-26 ENCOUNTER — Other Ambulatory Visit: Payer: Self-pay

## 2023-07-26 DIAGNOSIS — E0590 Thyrotoxicosis, unspecified without thyrotoxic crisis or storm: Secondary | ICD-10-CM

## 2023-07-28 ENCOUNTER — Other Ambulatory Visit: Payer: 59

## 2023-08-01 ENCOUNTER — Ambulatory Visit: Payer: 59 | Admitting: "Endocrinology

## 2023-08-02 DIAGNOSIS — R197 Diarrhea, unspecified: Secondary | ICD-10-CM | POA: Diagnosis not present

## 2023-08-02 DIAGNOSIS — R000 Tachycardia, unspecified: Secondary | ICD-10-CM | POA: Diagnosis not present

## 2023-08-02 DIAGNOSIS — Z20822 Contact with and (suspected) exposure to covid-19: Secondary | ICD-10-CM | POA: Diagnosis not present

## 2023-08-02 DIAGNOSIS — R002 Palpitations: Secondary | ICD-10-CM | POA: Diagnosis not present

## 2023-08-02 DIAGNOSIS — R531 Weakness: Secondary | ICD-10-CM | POA: Diagnosis not present

## 2023-08-02 DIAGNOSIS — R110 Nausea: Secondary | ICD-10-CM | POA: Diagnosis not present

## 2023-08-02 DIAGNOSIS — R109 Unspecified abdominal pain: Secondary | ICD-10-CM | POA: Diagnosis not present

## 2023-08-03 DIAGNOSIS — Z20822 Contact with and (suspected) exposure to covid-19: Secondary | ICD-10-CM | POA: Diagnosis not present

## 2023-08-03 DIAGNOSIS — R531 Weakness: Secondary | ICD-10-CM | POA: Diagnosis not present

## 2023-08-06 DIAGNOSIS — Z59819 Housing instability, housed unspecified: Secondary | ICD-10-CM | POA: Diagnosis not present

## 2023-08-06 DIAGNOSIS — R42 Dizziness and giddiness: Secondary | ICD-10-CM | POA: Diagnosis not present

## 2023-08-06 DIAGNOSIS — R531 Weakness: Secondary | ICD-10-CM | POA: Diagnosis not present

## 2023-08-06 DIAGNOSIS — R5383 Other fatigue: Secondary | ICD-10-CM | POA: Diagnosis not present

## 2023-08-06 DIAGNOSIS — R791 Abnormal coagulation profile: Secondary | ICD-10-CM | POA: Diagnosis not present

## 2023-08-06 DIAGNOSIS — R000 Tachycardia, unspecified: Secondary | ICD-10-CM | POA: Diagnosis not present

## 2023-08-06 DIAGNOSIS — R0602 Shortness of breath: Secondary | ICD-10-CM | POA: Diagnosis not present

## 2023-08-07 DIAGNOSIS — Z3483 Encounter for supervision of other normal pregnancy, third trimester: Secondary | ICD-10-CM | POA: Diagnosis not present

## 2023-08-07 DIAGNOSIS — Z3482 Encounter for supervision of other normal pregnancy, second trimester: Secondary | ICD-10-CM | POA: Diagnosis not present

## 2023-08-19 ENCOUNTER — Encounter (HOSPITAL_COMMUNITY): Payer: Self-pay

## 2023-08-19 ENCOUNTER — Ambulatory Visit (HOSPITAL_COMMUNITY)
Admission: EM | Admit: 2023-08-19 | Discharge: 2023-08-19 | Disposition: A | Discharge status: Home / Self Care | Payer: 59 | Attending: Family Medicine | Admitting: Family Medicine

## 2023-08-19 DIAGNOSIS — J039 Acute tonsillitis, unspecified: Secondary | ICD-10-CM

## 2023-08-19 DIAGNOSIS — J029 Acute pharyngitis, unspecified: Secondary | ICD-10-CM

## 2023-08-19 LAB — POCT RAPID STREP A (OFFICE): Rapid Strep A Screen: NEGATIVE

## 2023-08-19 LAB — MONONUCLEOSIS SCREEN: Mono Screen: NEGATIVE

## 2023-08-19 MED ORDER — LIDOCAINE VISCOUS HCL 2 % MT SOLN
15.0000 mL | OROMUCOSAL | 0 refills | Status: DC | PRN
Start: 2023-08-19 — End: 2023-08-30

## 2023-08-19 MED ORDER — AMOXICILLIN-POT CLAVULANATE 875-125 MG PO TABS: 1.0000 | ORAL_TABLET | Freq: Two times a day (BID) | ORAL | 0 refills | Status: DC

## 2023-08-19 MED ORDER — DEXAMETHASONE SODIUM PHOSPHATE 10 MG/ML IJ SOLN
10.0000 mg | Freq: Once | INTRAMUSCULAR | Status: AC
  Administered 2023-08-19: 10 mg via INTRAMUSCULAR

## 2023-08-19 MED ORDER — DEXAMETHASONE SODIUM PHOSPHATE 10 MG/ML IJ SOLN
INTRAMUSCULAR | Status: AC
  Filled 2023-08-19: qty 1

## 2023-08-19 NOTE — ED Triage Notes (Signed)
 Pt c/o headache, cough, rt side of tongue pain, and painful lymph nodes to neck x2 wks. Denies taking anything for pain.

## 2023-08-19 NOTE — Discharge Instructions (Addendum)
 Start taking Augmentin  twice daily for 7 days.  You can use lidocaine  solution as needed for sore throat.  Gargle and spit this do not swallow.  Your blood work will come back over the next few days and someone will call if results are positive and you require treatment.  I recommend following up with ENT as previously planned.  Return here as needed.

## 2023-08-19 NOTE — ED Provider Notes (Signed)
 [facility]    CSN: [id number] Arrival date & time: 08/19/23  1741      History   Chief Complaint Chief Complaint  Patient presents with   Headache    HPI Alison Cummings is a 23 y.o. female.   Patient presents with headache, cough, tongue pain, sore throat, and swollen lymph nodes x 2 weeks.  Patient denies taking anything for symptoms.  History of tonsillitis.   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   IMO SNOMED Dx Update Oct 2024     High blood pressure 06/11/2023   Hypertension    Hyperthyroidism 06/11/2023   Obesity    Seasonal allergies    Seizures (HCC)     Patient Active Problem List   Diagnosis Date Noted   Need for influenza vaccination 06/11/2023   Hyperthyroidism 06/11/2023   High blood pressure 06/11/2023   Tachycardia 06/11/2023   Loss of consciousness (HCC) 06/11/2023   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      Home Medications    Prior to Admission medications   Medication Sig Start Date End Date Taking? Authorizing Provider  amoxicillin -clavulanate (AUGMENTIN ) 875-125 MG tablet Take 1 tablet by mouth every 12 (twelve) hours. 08/19/23  Yes Johnie, Cythina Mickelsen A, NP  lidocaine  (XYLOCAINE ) 2 % solution Use as directed 15 mLs in the mouth or throat as needed for mouth pain.  08/19/23  Yes Johnie, Emilyrose Darrah A, NP  acetaminophen  (TYLENOL ) 500 MG tablet Take 500 mg by mouth every 6 (six) hours as needed.    [provider]  atenolol  (TENORMIN ) 50 MG tablet Take 1 tablet (50 mg total) by mouth daily. 07/17/23   Burnard Debby DELENA, MD  dexmethylphenidate  (FOCALIN  XR) 10 MG 24 hr capsule Take 1 capsule by mouth every morning.    [provider]  escitalopram  (LEXAPRO ) 20 MG tablet Take 20 mg by mouth daily. 04/23/23   [provider]  FOCALIN  XR 20 MG 24 hr capsule Take 20 mg by mouth daily. 04/22/23   [provider]  metFORMIN  (GLUCOPHAGE ) 1000 MG tablet Take 1,000 mg by mouth daily.    [provider]  methimazole  (TAPAZOLE ) 10 MG tablet Take 1 tablet (10 mg total) by mouth daily. 07/14/23   Paseda, Folashade R, FNP  promethazine -dextromethorphan (PROMETHAZINE -DM) 6.25-15 MG/5ML syrup Take 5 mLs by mouth 4 (four) times daily as needed for cough. 07/05/23   Rolinda Rogue, MD  VRAYLAR  1.5 MG capsule Take 1.5 mg by mouth daily.    [provider]    Family History Family History  Problem Relation Age of Onset   Schizophrenia Mother    High blood pressure Mother    Hyperlipidemia Mother    High blood pressure Father    ADD / ADHD Father    Bipolar disorder Father    Stroke Father  Diabetes Sister    Hyperlipidemia Sister     Social History Social History   Tobacco Use   Smoking status: Never   Smokeless tobacco: Never  Substance Use Topics   Alcohol use: No   Drug use: Yes    Types: Marijuana    Comment: sometimes     Allergies   Apple, Cherry, and Ibuprofen    Review of Systems Review of Systems  Constitutional:  Positive for chills and fever. Negative for fatigue.  HENT:  Positive for congestion and sore throat.   Respiratory:  Positive for cough. Negative for shortness of breath.   Cardiovascular:  Negative for chest pain.  Neurological:  Positive for headaches.     Physical Exam Triage  Vital Signs ED Triage Vitals  Encounter Vitals Group     BP 08/19/23 1906 104/75     Systolic BP Percentile --      Diastolic BP Percentile --      Pulse Rate 08/19/23 1906 (!) 137     Resp 08/19/23 1906 18     Temp 08/19/23 1906 99.4 F (37.4 C)     Temp Source 08/19/23 1906 Oral     SpO2 08/19/23 1906 96 %     Weight --      Height --      Head Circumference --      Peak Flow --      Pain Score 08/19/23 1907 10     Pain Loc --      Pain Education --      Exclude from Growth Chart --    No data found.  Updated Vital Signs BP 104/75 (BP Location: Right Arm)   Pulse (!) 137   Temp 99.4 F (37.4 C) (Oral)   Resp 18   LMP 07/12/2023 (Approximate)   SpO2 96%   Visual Acuity Right Eye Distance:   Left Eye Distance:   Bilateral Distance:    Right Eye Near:   Left Eye Near:    Bilateral Near:     Physical Exam Vitals and nursing note reviewed.  Constitutional:      General: She is awake. She is not in acute distress.    Appearance: Normal appearance. She is well-developed and well-groomed. She is not ill-appearing.  HENT:     Right Ear: Tympanic membrane, ear canal and external ear normal.     Left Ear: Tympanic membrane, ear canal and external ear normal.     Nose: Congestion and rhinorrhea present.     Mouth/Throat:     Pharynx: Oropharyngeal exudate, posterior oropharyngeal erythema and postnasal drip present.     Tonsils: Tonsillar exudate present.  Neurological:     Mental Status: She is alert.  Psychiatric:        Behavior: Behavior is cooperative.      UC Treatments / Results  Labs (all labs ordered are listed, but only abnormal results are displayed) Labs Reviewed  POCT RAPID STREP A (OFFICE) - Normal  CULTURE, GROUP A STREP [facility])  MONONUCLEOSIS SCREEN    EKG   Radiology No results found.  Procedures Procedures (including critical care time)  Medications Ordered in UC Medications  dexamethasone  (DECADRON ) injection 10 mg (10 mg  Intramuscular Given 08/19/23 1944)    Initial Impression / Assessment and Plan / UC Course  I have reviewed the triage vital signs and the nursing notes.  Pertinent labs & imaging results that were available during my care of the patient were reviewed by me and  considered in my medical decision making (see chart for details).     Patient presented with headache, cough, tongue pain, sore throat, and swollen lymph nodes x 2 weeks.  Upon assessment patient has moderately swollen tonsils with exudate and erythema.  Congestion and rhinorrhea present.  Rapid strep was negative, will send culture.  Ordered mononucleosis screen to rule out.    Patient stated that she has an ENT that she is supposed to follow-up with but has not yet.  Prescribed Augmentin  for tonsillitis.  Prescribed lidocaine  solution as needed for sore throat.  Discussed follow-up, return precautions. Final Clinical Impressions(s) / UC Diagnoses   Final diagnoses:  Acute tonsillitis, unspecified etiology  Sore throat     Discharge Instructions      Start taking Augmentin  twice daily for 7 days.  You can use lidocaine  solution as needed for sore throat.  Gargle and spit this do not swallow.  Your blood work will come back over the next few days and someone will call if results are positive and you require treatment.  I recommend following up with ENT as previously planned.  Return here as needed.    ED Prescriptions     Medication Sig Dispense Auth. Provider   amoxicillin -clavulanate (AUGMENTIN ) 875-125 MG tablet Take 1 tablet by mouth every 12 (twelve) hours. 14 tablet Johnie, Jermy Couper A, NP   lidocaine  (XYLOCAINE ) 2 % solution Use as directed 15 mLs in the mouth or throat as needed for mouth pain. 100 mL Johnie Flaming A, NP      PDMP not reviewed this encounter.   Johnie Flaming A, NP 08/19/23 2008

## 2023-08-22 LAB — CULTURE, GROUP A STREP (THRC)

## 2023-08-28 ENCOUNTER — Ambulatory Visit: Payer: 59 | Attending: Nurse Practitioner | Admitting: Nurse Practitioner

## 2023-08-28 ENCOUNTER — Ambulatory Visit: Payer: 59 | Attending: Nurse Practitioner

## 2023-08-28 ENCOUNTER — Encounter: Payer: Self-pay | Admitting: Nurse Practitioner

## 2023-08-28 VITALS — BP 114/70 | HR 117 | Ht 62.0 in | Wt 208.2 lb

## 2023-08-28 DIAGNOSIS — Z136 Encounter for screening for cardiovascular disorders: Secondary | ICD-10-CM | POA: Diagnosis not present

## 2023-08-28 DIAGNOSIS — I10 Essential (primary) hypertension: Secondary | ICD-10-CM | POA: Diagnosis not present

## 2023-08-28 DIAGNOSIS — E0590 Thyrotoxicosis, unspecified without thyrotoxic crisis or storm: Secondary | ICD-10-CM | POA: Diagnosis not present

## 2023-08-28 DIAGNOSIS — Z87898 Personal history of other specified conditions: Secondary | ICD-10-CM | POA: Diagnosis not present

## 2023-08-28 DIAGNOSIS — R000 Tachycardia, unspecified: Secondary | ICD-10-CM | POA: Diagnosis not present

## 2023-08-28 NOTE — Addendum Note (Signed)
Addended by: Lamar Benes on: 08/28/2023 10:52 AM   Modules accepted: Orders

## 2023-08-28 NOTE — Patient Instructions (Addendum)
Medication Instructions:  Your physician recommends that you continue on your current medications as directed. Please refer to the Current Medication list given to you today. Please Take your Atenolol 50 mg daily  *If you need a refill on your cardiac medications before your next appointment, please call your pharmacy*   Lab Work: CBC, BMET, TSH, Magnesium today  Testing/Procedures: Your physician has requested that you have an echocardiogram. Echocardiography is a painless test that uses sound waves to create images of your heart. It provides your doctor with information about the size and shape of your heart and how well your heart's chambers and valves are working. This procedure takes approximately one hour. There are no restrictions for this procedure. Please do NOT wear cologne, perfume, aftershave, or lotions (deodorant is allowed). Please arrive 15 minutes prior to your appointment time.  Please note: We ask at that you not bring children with you during ultrasound (echo/ vascular) testing. Due to room size and safety concerns, children are not allowed in the ultrasound rooms during exams. Our front office staff cannot provide observation of children in our lobby area while testing is being conducted. An adult accompanying a patient to their appointment will only be allowed in the ultrasound room at the discretion of the ultrasound technician under special circumstances. We apologize for any inconvenience.  ZIO XT- Long Term Monitor Instructions  Your physician has requested you wear a ZIO patch monitor for 14 days.  This is a single patch monitor. [employer] supplies one patch monitor per enrollment. Additional stickers are not available. Please do not apply patch if you will be having a Nuclear Stress Test,  Echocardiogram, Cardiac CT, MRI, or Chest Xray during the period you would be wearing the  monitor. The patch cannot be worn during these tests. You cannot remove and re-apply the   ZIO XT patch monitor.  Your ZIO patch monitor will be mailed 3 day USPS to your address on file. It may take 3-5 days  to receive your monitor after you have been enrolled.  Once you have received your monitor, please review the enclosed instructions. Your monitor  has already been registered assigning a specific monitor serial # to you.  Billing and Patient Assistance Program Information  We have supplied [employer] with any of your insurance information on file for billing purposes. [employer] offers a sliding scale Patient Assistance Program for patients that do not have  insurance, or whose insurance does not completely cover the cost of the ZIO monitor.  You must apply for the Patient Assistance Program to qualify for this discounted rate.  To apply, please call [employer] at [phone], select option 4, select option 2, ask to apply for  Patient Assistance Program. Meredeth Ide will ask your household income, and how many people  are in your household. They will quote your out-of-pocket cost based on that information.  [employer] will also be able to set up a 24-month, interest-free payment plan if needed.  Applying the monitor   Shave hair from upper left chest.  Hold abrader disc by orange tab. Rub abrader in 40 strokes over the upper left chest as  indicated in your monitor instructions.  Clean area with 4 enclosed alcohol pads. Let dry.  Apply patch as indicated in monitor instructions. Patch will be placed under collarbone on left  side of chest with arrow pointing upward.  Rub patch adhesive wings for 2 minutes. Remove white label marked "1". Remove the white  label marked "2". Rub patch  adhesive wings for 2 additional minutes.  While looking in a mirror, press and release button in center of patch. A small green light will  flash 3-4 times. This will be your only indicator that the monitor has been turned on.  Do not shower for the first 24 hours. You may shower after the first 24 hours.   Press the button if you feel a symptom. You will hear a small click. Record Date, Time and  Symptom in the Patient Logbook.  When you are ready to remove the patch, follow instructions on the last 2 pages of Patient  Logbook. Stick patch monitor onto the last page of Patient Logbook.  Place Patient Logbook in the blue and white box. Use locking tab on box and tape box closed  securely. The blue and white box has prepaid postage on it. Please place it in the mailbox as  soon as possible. Your physician should have your test results approximately 7 days after the  monitor has been mailed back to [facility].  Call [facility] Customer Care at [phone] if you have questions regarding  your ZIO XT patch monitor. Call them immediately if you see an orange light blinking on your  monitor.  If your monitor falls off in less than 4 days, contact our Monitor department at [phone].  If your monitor becomes loose or falls off after 4 days call [employer] at [phone] for  suggestions on securing your monitor   Follow-Up: At [facility], you and your health needs are our priority.  As part of our continuing mission to provide you with exceptional heart care, we have created designated Provider Care Teams.  These Care Teams include your primary Cardiologist (physician) and Advanced Practice Providers (APPs -  Physician Assistants and Nurse Practitioners) who all work together to provide you with the care you need, when you need it.  We recommend signing up for the patient portal called "MyChart".  Sign up information is provided on this After Visit Summary.  MyChart is used to connect with patients for Virtual Visits (Telemedicine).  Patients are able to view lab/test results, encounter notes, upcoming appointments, etc.  Non-urgent messages can be sent to your provider as well.   To learn more about what you can do with MyChart, go to [email or web address].    Your next  appointment:   8  week(s)  Provider:   Bernadene Person, NP        Other Instructions

## 2023-08-28 NOTE — Progress Notes (Signed)
Office Visit    Patient Name: Alison Cummings Date of Encounter: 08/28/2023  Primary Care Provider:  Donell Beers, FNP Primary Cardiologist:  Nicki Guadalajara, MD  Chief Complaint    23 year old female with a history of sinus tachycardia, syncope, hypertension, ADHD, obesity, hyperthyroidism, seizures, anxiety, and depression who presents for follow-up related to syncope and tachycardia.  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   IMO SNOMED Dx Update Oct 2024     High blood pressure 06/11/2023   Hypertension    Hyperthyroidism 06/11/2023   Obesity    Seasonal allergies    Seizures (HCC)    Past Surgical History:  Procedure Laterality Date   ADENOIDECTOMY      Allergies  Allergies  Allergen Reactions   Apple Itching, Swelling and Other (See Comments)    Tongue swells and throat itches   Cherry Itching, Swelling and Other (See Comments)    Tongue swells and throat itches   Ibuprofen Palpitations     Labs/Other Studies Reviewed    The following studies were reviewed today:     Recent Labs: 04/10/2023: ALT 51; BUN 8; Creatinine, Ser 0.70; Hemoglobin 11.0; Magnesium 1.8; Platelets 203; Potassium 3.8; Sodium 138; TSH <0.010  Recent Lipid Panel No results found for: "CHOL", "TRIG", "HDL", "CHOLHDL", "VLDL", "LDLCALC", "LDLDIRECT"  History of Present Illness    23 year old female with the above past medical history including sinus tachycardia, syncope, hypertension, ADHD, obesity, hyperthyroidism, seizures, anxiety, and depression.  She was referred to cardiology for evaluation of syncope, tachycardia.  She had been undergoing job training in the state of [place]  and had 2 episodes of passing out on 2 different days.  She reported loss of consciousness.  She was evaluated in the ED and was noted to be tachycardic, blood pressure was elevated.  Labs were concerning for hyperthyroidism.  He was referred to endocrinology.  She was started on atenolol.  She was last seen in the office on 07/17/2023 and was stable from a cardiac standpoint.  She denies recurrent syncope.  Echocardiogram was ordered but not completed, 14-day Zio patch was ordered but not completed.  She was evaluated in the ED on 08/06/2023 in [facility] in the setting of dizziness, housing instability.  Her magnesium was low.  She was advised to take magnesium 400 mg daily.  She was seen in the ED on 08/19/2023 in the setting of acute tonsillitis.  With antibiotics and advised to follow-up with ENT.  She presents today for follow-up.  Since her last visit she has been stable overall from a cardiac standpoint.  She has had some personal difficulties and was staying in a shelter, she has since moved to her sister's apartment.  She is considering attending job core training in [place] state, this would be for the duration of 1 year.  She never completed her echocardiogram.  She notes she never received the heart monitor.  She has not been taking atenolol as prescribed.  Additionally, she has not been taking her other medications.  She notes she was unable to get to a pharmacy.  She does not have regular transportation.  She notes occasional palpitations, occasional shortness of breath, she denies  any chest pain, PND, orthopnea, dizziness, presyncope or syncope.  Home Medications    Current Outpatient Medications  Medication Sig Dispense Refill   acetaminophen (TYLENOL) 500 MG tablet Take 500 mg by mouth every 6 (six) hours as needed.     amoxicillin-clavulanate (AUGMENTIN) 875-125 MG tablet Take 1 tablet by mouth every 12 (twelve) hours. 14 tablet 0   atenolol (TENORMIN) 50 MG tablet Take 1 tablet (50 mg  total) by mouth daily. 90 tablet 0   dexmethylphenidate (FOCALIN XR) 10 MG 24 hr capsule Take 1 capsule by mouth every morning.     escitalopram (LEXAPRO) 20 MG tablet Take 20 mg by mouth daily.     FOCALIN XR 20 MG 24 hr capsule Take 20 mg by mouth daily.     lidocaine (XYLOCAINE) 2 % solution Use as directed 15 mLs in the mouth or throat as needed for mouth pain. 100 mL 0   metFORMIN (GLUCOPHAGE) 1000 MG tablet Take 1,000 mg by mouth daily.     methimazole (TAPAZOLE) 10 MG tablet Take 1 tablet (10 mg total) by mouth daily. 30 tablet 1   promethazine-dextromethorphan (PROMETHAZINE-DM) 6.25-15 MG/5ML syrup Take 5 mLs by mouth 4 (four) times daily as needed for cough. 118 mL 0   VRAYLAR 1.5 MG capsule Take 1.5 mg by mouth daily.     No current facility-administered medications for this visit.     Review of Systems    She denies chest pain, dyspnea, pnd, orthopnea, n, v, dizziness, syncope, edema, weight gain, or early satiety. All other systems reviewed and are otherwise negative except as noted above.   Physical Exam    VS:  BP 114/70 (BP Location: Left Arm, Patient Position: Sitting, Cuff Size: Normal)   Pulse (!) 117   Ht 5\' 2"  (1.575 m)   Wt 208 lb 3.2 oz (94.4 kg)   LMP 07/12/2023 (Approximate)   SpO2 96%   BMI 38.08 kg/m   GEN: Well nourished, well developed, in no acute distress. HEENT: normal. Neck: Supple, no JVD, carotid bruits, or masses. Cardiac: RRR, no murmurs, rubs, or gallops. No clubbing, cyanosis, edema.  Radials/DP/PT 2+ and equal bilaterally.  Respiratory:  Respirations regular and unlabored, clear to auscultation bilaterally. GI: Soft, nontender, nondistended, BS + x 4. MS: no deformity or atrophy. Skin: warm and dry, no rash. Neuro:  Strength and sensation are intact. Psych: Normal affect.  Accessory Clinical Findings    ECG personally reviewed by me today - EKG Interpretation Date/Time:  Thursday August 28 2023 10:06:09 EST Ventricular Rate:  117 PR  Interval:  126 QRS Duration:  68 QT Interval:  300 QTC Calculation: 418 R Axis:   11  Text Interpretation: Sinus tachycardia Minimal voltage criteria for LVH, may be normal variant ( R in aVL ) When compared with ECG of 17-Jul-2023 13:33, No significant change was found Confirmed by Bernadene Person ([id number]) on 08/28/2023 10:09:43 AM  - no acute changes.   Lab Results  Component Value Date   WBC 5.3 04/10/2023   HGB 11.0 (L) 04/10/2023   HCT 34.9 (L) 04/10/2023   MCV 76.2 (L) 04/10/2023   PLT 203 04/10/2023   Lab Results  Component Value Date   CREATININE 0.70 04/10/2023   BUN 8 04/10/2023   NA 138 04/10/2023   K 3.8 04/10/2023   CL 106 04/10/2023   CO2 24 04/10/2023   Lab Results  Component Value Date   ALT 51 (H) 04/10/2023   AST 37 04/10/2023  ALKPHOS 68 04/10/2023   BILITOT 0.5 04/10/2023   No results found for: "CHOL", "HDL", "LDLCALC", "LDLDIRECT", "TRIG", "CHOLHDL"  Lab Results  Component Value Date   HGBA1C 5.1 06/11/2023    Assessment & Plan    1. History of syncope: Occurred during job training in [place] state, 2 episodes on 2 different days.  She denies any recurrent syncope.  Echo, 14-day ZIO pending as below.  2. Sinus tachycardia/palpitations: EKG today shows sinus tachycardia.  She has not started taking atenolol.  She has not been taking her Focalin either.  Recent ED evaluation in the setting of possible dehydration, hypomagnesemia.  She is generally asymptomatic though she does note occasional palpitations, occasional shortness of breath.  Proceed with echocardiogram, 14-day ZIO monitor (patient states she never received prior monitor).  Will check CBC, BMET, TSH, magnesium today.  Recommend follow-up with endocrinology.  Reviewed ED precautions.  Encourage adequate hydration, activity as tolerated.  3. Hypertension: BP well controlled. Continue current antihypertensive regimen.   4. Hyperthyroidism: Recommend follow-up with endocrinology.  Repeat TSH  pending.  5. Disposition: Follow-up in 8 weeks.       Joylene Grapes, NP 08/28/2023, 10:37 AM

## 2023-08-28 NOTE — Progress Notes (Unsigned)
Enrolled for [employer] to mail a ZIO XT long term holter monitor to [address] , [address], [place], [place] [address].  Dr. Tresa Endo to read.

## 2023-08-29 ENCOUNTER — Encounter (HOSPITAL_BASED_OUTPATIENT_CLINIC_OR_DEPARTMENT_OTHER): Payer: Self-pay

## 2023-08-29 ENCOUNTER — Telehealth (HOSPITAL_BASED_OUTPATIENT_CLINIC_OR_DEPARTMENT_OTHER): Payer: Self-pay

## 2023-08-29 LAB — CBC
Hematocrit: 40.2 % (ref 34.0–46.6)
Hemoglobin: 12.3 g/dL (ref 11.1–15.9)
MCH: 23.5 pg — ABNORMAL LOW (ref 26.6–33.0)
MCHC: 30.6 g/dL — ABNORMAL LOW (ref 31.5–35.7)
MCV: 77 fL — ABNORMAL LOW (ref 79–97)
Platelets: 219 10*3/uL (ref 150–450)
RBC: 5.24 x10E6/uL (ref 3.77–5.28)
RDW: 13.6 % (ref 11.7–15.4)
WBC: 6.6 10*3/uL (ref 3.4–10.8)

## 2023-08-29 LAB — BASIC METABOLIC PANEL
BUN/Creatinine Ratio: 20 (ref 9–23)
BUN: 10 mg/dL (ref 6–20)
CO2: 23 mmol/L (ref 20–29)
Calcium: 9.9 mg/dL (ref 8.7–10.2)
Chloride: 103 mmol/L (ref 96–106)
Creatinine, Ser: 0.5 mg/dL — ABNORMAL LOW (ref 0.57–1.00)
Glucose: 76 mg/dL (ref 70–99)
Potassium: 4.3 mmol/L (ref 3.5–5.2)
Sodium: 141 mmol/L (ref 134–144)
eGFR: 137 mL/min/{1.73_m2} (ref >59)

## 2023-08-29 LAB — MAGNESIUM: Magnesium: 1.8 mg/dL (ref 1.6–2.3)

## 2023-08-29 LAB — TSH: TSH: <0.005 u[IU]/mL — ABNORMAL LOW (ref 0.450–4.500)

## 2023-08-29 NOTE — Telephone Encounter (Signed)
Lab results reviewed via mychart by pt.

## 2023-08-30 ENCOUNTER — Ambulatory Visit (HOSPITAL_COMMUNITY)
Admission: EM | Admit: 2023-08-30 | Discharge: 2023-08-30 | Disposition: A | Discharge status: Home / Self Care | Payer: 59 | Attending: Nurse Practitioner | Admitting: Nurse Practitioner

## 2023-08-30 DIAGNOSIS — F4323 Adjustment disorder with mixed anxiety and depressed mood: Secondary | ICD-10-CM

## 2023-08-30 DIAGNOSIS — F411 Generalized anxiety disorder: Secondary | ICD-10-CM | POA: Diagnosis not present

## 2023-08-30 MED ORDER — ESCITALOPRAM OXALATE 10 MG PO TABS: 10.0000 mg | ORAL_TABLET | Freq: Every day | ORAL | 0 refills | Status: DC

## 2023-08-30 MED ORDER — VRAYLAR 1.5 MG PO CAPS: 1.5000 mg | ORAL_CAPSULE | Freq: Every day | ORAL | 0 refills | Status: AC
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
## 2023-08-30 NOTE — Discharge Instructions (Signed)
Discharge recommendations:  Patient is to take medications as prescribed. Please see information for follow-up appointment with psychiatry and therapy. Please follow up with your primary care provider for all medical related needs.   Therapy: We recommend that patient participate in individual therapy to address mental health concerns.  Medications: The patient or guardian is to contact a medical professional and/or outpatient provider to address any new side effects that develop. The patient or guardian should update outpatient providers of any new medications and/or medication changes.   Atypical antipsychotics: If you are prescribed an atypical antipsychotic, it is recommended that your height, weight, BMI, blood pressure, fasting lipid panel, and fasting blood sugar be monitored by your outpatient providers.  Safety:  The patient should abstain from use of illicit substances/drugs and abuse of any medications. If symptoms worsen or do not continue to improve or if the patient becomes actively suicidal or homicidal then it is recommended that the patient return to the closest hospital emergency department, the [facility], or call 911 for further evaluation and treatment. National Suicide Prevention Lifeline 1-800-SUICIDE or 774-157-0243.  About 988 988 offers 24/7 access to trained crisis counselors who can help people experiencing mental health-related distress. People can call or text 988 or chat 988lifeline.org for themselves or if they are worried about a loved one who may need crisis support.  Crisis Mobile: [facility]:                     (712)699-7089 (for crisis response 24 hours a day) [facility] Hotline:                                            541-672-2523.

## 2023-08-30 NOTE — Progress Notes (Signed)
   08/30/23 1914  [facility] Triage Screening (Walk-ins at [facility] only)  How Did You Hear About [place]? Family/Friend  What Is the Reason for Your Visit/Call Today? Pt presents to [facility] as a voluntary walk-in, unaccompanied requesting medication refill. Pt reports that she is experiencing mood and anger outbursts more often than usual. Pt reports diagnosis of anxiety, depression, ADHD and mood disorder. [facility] for [facility] management. Pt reports being prescribed Focalin, Lexapro, Clonidine and Vraylar, however she has not had medications since Nov-Dec 2024. Pt currently denies SI,HI,AVH and substance/alcohol use.  How Long Has This Been Causing You Problems? 1-6 months  Have You Recently Had Any Thoughts About Hurting Yourself? No  Are You Planning to Commit Suicide/Harm Yourself At This time? No  Have you Recently Had Thoughts About Hurting Someone Alison Cummings? No  Are You Planning To Harm Someone At This Time? No  Physical Abuse Denies  Verbal Abuse Denies  Sexual Abuse Denies  Exploitation of patient/patient's resources Denies  Self-Neglect Denies  Are you currently experiencing any auditory, visual or other hallucinations? No  Have You Used Any Alcohol or Drugs in the Past 24 Hours? No  Do you have any current medical co-morbidities that require immediate attention? No  Clinician description of patient physical appearance/behavior: cooperative, calm, oriented  What Do You Feel Would Help You the Most Today? Medication(s)  If access to [facility] was not available, would you have sought care in the Emergency Department? No  Determination of Need Routine (7 days)  Options For Referral Other: Comment;Outpatient Therapy;Medication Management

## 2023-09-02 DIAGNOSIS — B974 Respiratory syncytial virus as the cause of diseases classified elsewhere: Secondary | ICD-10-CM | POA: Diagnosis not present

## 2023-09-02 DIAGNOSIS — R079 Chest pain, unspecified: Secondary | ICD-10-CM | POA: Diagnosis not present

## 2023-09-02 DIAGNOSIS — R509 Fever, unspecified: Secondary | ICD-10-CM | POA: Diagnosis not present

## 2023-09-02 DIAGNOSIS — R000 Tachycardia, unspecified: Secondary | ICD-10-CM | POA: Diagnosis not present

## 2023-09-02 DIAGNOSIS — Z743 Need for continuous supervision: Secondary | ICD-10-CM | POA: Diagnosis not present

## 2023-09-02 DIAGNOSIS — R059 Cough, unspecified: Secondary | ICD-10-CM | POA: Diagnosis not present

## 2023-09-02 DIAGNOSIS — Z20822 Contact with and (suspected) exposure to covid-19: Secondary | ICD-10-CM | POA: Diagnosis not present

## 2023-09-03 ENCOUNTER — Telehealth: Payer: Self-pay

## 2023-09-03 NOTE — Transitions of Care (Post Inpatient/ED Visit) (Signed)
   09/03/2023  Name: Alison Cummings MRN: [id number] DOB: 02-25-2001  Today's TOC FU Call Status: Today's TOC FU Call Status:: Unsuccessful Call (1st Attempt) Unsuccessful Call (1st Attempt) Date: 09/03/23  Attempted to reach the patient regarding the most recent Inpatient/ED visit.  Follow Up Plan: No further outreach attempts will be made at this time. We have been unable to contact the patient.  Signature  [employer], [place]

## 2023-09-05 ENCOUNTER — Telehealth: Payer: Self-pay

## 2023-09-05 NOTE — Transitions of Care (Post Inpatient/ED Visit) (Signed)
   09/05/2023  Name: Alison Cummings MRN: [id number] DOB: 2000-09-22  Today's TOC FU Call Status: Today's TOC FU Call Status:: Unsuccessful Call (2nd Attempt) Unsuccessful Call (2nd Attempt) Date: 09/05/23  Attempted to reach the patient regarding the most recent Inpatient/ED visit.  Follow Up Plan: Additional outreach attempts will be made to reach the patient to complete the Transitions of Care (Post Inpatient/ED visit) call.   Signature  [employer], [place]

## 2023-09-08 ENCOUNTER — Telehealth: Payer: Self-pay | Admitting: Cardiovascular Disease

## 2023-09-08 ENCOUNTER — Telehealth: Payer: Self-pay

## 2023-09-08 NOTE — Transitions of Care (Post Inpatient/ED Visit) (Unsigned)
   09/08/2023  Name: Alison Cummings MRN: [id number] DOB: [birth year]  Today's TOC FU Call Status: Today's TOC FU Call Status:: Unsuccessful Call (3rd Attempt) Unsuccessful Call (3rd Attempt) Date: 09/08/23  Attempted to reach the patient regarding the most recent Inpatient/ED visit.  Follow Up Plan: No further outreach attempts will be made at this time. We have been unable to contact the patient.  Signature  [employer], [place]

## 2023-09-08 NOTE — Telephone Encounter (Signed)
Spoke with patient. Advised message received and MD is OOO until later this week -- message will be reviewed upon return

## 2023-09-08 NOTE — Telephone Encounter (Signed)
Patient is calling because she is now living in [place], [place]. Patient would like to know if she could get a referral sent to a cardiologist in [place]. Patient is requesting a call back on the number of [phone]. Please advise.

## 2023-09-10 ENCOUNTER — Ambulatory Visit: Payer: Self-pay | Admitting: Nurse Practitioner

## 2023-09-11 ENCOUNTER — Ambulatory Visit: Payer: 59 | Admitting: "Endocrinology

## 2023-09-11 ENCOUNTER — Other Ambulatory Visit (HOSPITAL_BASED_OUTPATIENT_CLINIC_OR_DEPARTMENT_OTHER): Payer: Self-pay

## 2023-09-11 DIAGNOSIS — Z7689 Persons encountering health services in other specified circumstances: Secondary | ICD-10-CM

## 2023-09-11 NOTE — Telephone Encounter (Signed)
 Referral placed for cardiology closer to patients home

## 2023-09-17 DIAGNOSIS — E0590 Thyrotoxicosis, unspecified without thyrotoxic crisis or storm: Secondary | ICD-10-CM | POA: Diagnosis not present

## 2023-09-17 DIAGNOSIS — F259 Schizoaffective disorder, unspecified: Secondary | ICD-10-CM | POA: Diagnosis not present

## 2023-09-17 DIAGNOSIS — F419 Anxiety disorder, unspecified: Secondary | ICD-10-CM | POA: Diagnosis not present

## 2023-09-17 DIAGNOSIS — Z743 Need for continuous supervision: Secondary | ICD-10-CM | POA: Diagnosis not present

## 2023-09-17 DIAGNOSIS — R45851 Suicidal ideations: Secondary | ICD-10-CM | POA: Diagnosis not present

## 2023-09-18 ENCOUNTER — Telehealth: Payer: Self-pay

## 2023-09-18 NOTE — Transitions of Care (Post Inpatient/ED Visit) (Signed)
   09/18/2023  Name: Alison Cummings MRN: [id number] DOB: October 17, 2000  Today's TOC FU Call Status: Today's TOC FU Call Status:: Unsuccessful Call (1st Attempt) Unsuccessful Call (1st Attempt) Date: 09/18/23  Attempted to reach the patient regarding the most recent Inpatient/ED visit.  Follow Up Plan: Additional outreach attempts will be made to reach the patient to complete the Transitions of Care (Post Inpatient/ED visit) call.   Signature Renelda Loma RMA

## 2023-09-23 ENCOUNTER — Ambulatory Visit (HOSPITAL_COMMUNITY): Admission: RE | Admit: 2023-09-23 | Payer: 59 | Source: Ambulatory Visit

## 2023-10-01 ENCOUNTER — Ambulatory Visit: Payer: Self-pay | Admitting: Nurse Practitioner

## 2023-10-06 ENCOUNTER — Emergency Department (HOSPITAL_COMMUNITY)
Admission: EM | Admit: 2023-10-06 | Discharge: 2023-10-06 | Discharge status: Against Medical Advice | Attending: Emergency Medicine | Admitting: Emergency Medicine

## 2023-10-06 ENCOUNTER — Other Ambulatory Visit: Payer: Self-pay

## 2023-10-06 ENCOUNTER — Encounter (HOSPITAL_COMMUNITY): Payer: Self-pay

## 2023-10-06 DIAGNOSIS — Z5321 Procedure and treatment not carried out due to patient leaving prior to being seen by health care provider: Secondary | ICD-10-CM | POA: Insufficient documentation

## 2023-10-06 DIAGNOSIS — N939 Abnormal uterine and vaginal bleeding, unspecified: Secondary | ICD-10-CM | POA: Insufficient documentation

## 2023-10-06 LAB — CBC
HCT: 32.5 % — ABNORMAL LOW (ref 36.0–46.0)
Hemoglobin: 10.2 g/dL — ABNORMAL LOW (ref 12.0–15.0)
MCH: 23.4 pg — ABNORMAL LOW (ref 26.0–34.0)
MCHC: 31.4 g/dL (ref 30.0–36.0)
MCV: 74.7 fL — ABNORMAL LOW (ref 80.0–100.0)
Platelets: 163 10*3/uL (ref 150–400)
RBC: 4.35 MIL/uL (ref 3.87–5.11)
RDW: 14.5 % (ref 11.5–15.5)
WBC: 4.4 10*3/uL (ref 4.0–10.5)
nRBC: 0 % (ref 0.0–0.2)

## 2023-10-06 LAB — HCG, SERUM, QUALITATIVE: Preg, Serum: POSITIVE — AB

## 2023-10-06 NOTE — ED Triage Notes (Signed)
 Pt came in via POV d/t her menstrual cycle going over a month now, denies abd pain & endorses 6-7 pads a day.

## 2023-10-06 NOTE — ED Notes (Signed)
 Pt requested to leave as wait tie is too long. Moved OTF.

## 2023-10-07 ENCOUNTER — Telehealth: Payer: Self-pay

## 2023-10-07 ENCOUNTER — Other Ambulatory Visit (HOSPITAL_COMMUNITY)

## 2023-10-07 ENCOUNTER — Emergency Department (HOSPITAL_COMMUNITY)

## 2023-10-07 ENCOUNTER — Encounter (HOSPITAL_COMMUNITY): Payer: Self-pay | Admitting: Emergency Medicine

## 2023-10-07 ENCOUNTER — Inpatient Hospital Stay (HOSPITAL_COMMUNITY)
Admission: EM | Admit: 2023-10-07 | Discharge: 2023-10-07 | Disposition: A | Discharge status: Home / Self Care | Attending: Obstetrics and Gynecology | Admitting: Obstetrics and Gynecology

## 2023-10-07 DIAGNOSIS — O4690 Antepartum hemorrhage, unspecified, unspecified trimester: Secondary | ICD-10-CM | POA: Insufficient documentation

## 2023-10-07 DIAGNOSIS — O3680X0 Pregnancy with inconclusive fetal viability, not applicable or unspecified: Secondary | ICD-10-CM

## 2023-10-07 DIAGNOSIS — Z3201 Encounter for pregnancy test, result positive: Secondary | ICD-10-CM | POA: Diagnosis not present

## 2023-10-07 DIAGNOSIS — O009 Unspecified ectopic pregnancy without intrauterine pregnancy: Secondary | ICD-10-CM | POA: Insufficient documentation

## 2023-10-07 DIAGNOSIS — Z3A00 Weeks of gestation of pregnancy not specified: Secondary | ICD-10-CM | POA: Diagnosis not present

## 2023-10-07 DIAGNOSIS — Z3A01 Less than 8 weeks gestation of pregnancy: Secondary | ICD-10-CM | POA: Diagnosis not present

## 2023-10-07 DIAGNOSIS — R7989 Other specified abnormal findings of blood chemistry: Secondary | ICD-10-CM

## 2023-10-07 LAB — BASIC METABOLIC PANEL
Anion gap: 10 (ref 5–15)
BUN: 7 mg/dL (ref 6–20)
CO2: 23 mmol/L (ref 22–32)
Calcium: 8.6 mg/dL — ABNORMAL LOW (ref 8.9–10.3)
Chloride: 101 mmol/L (ref 98–111)
Creatinine, Ser: 0.42 mg/dL — ABNORMAL LOW (ref 0.44–1.00)
GFR, Estimated: >60 mL/min (ref >60)
Glucose, Bld: 101 mg/dL — ABNORMAL HIGH (ref 70–99)
Potassium: 4.2 mmol/L (ref 3.5–5.1)
Sodium: 134 mmol/L — ABNORMAL LOW (ref 135–145)

## 2023-10-07 LAB — CBC WITH DIFFERENTIAL/PLATELET
Abs Immature Granulocytes: 0 10*3/uL (ref 0.00–0.07)
Basophils Absolute: 0 10*3/uL (ref 0.0–0.1)
Basophils Relative: 0 %
Eosinophils Absolute: 0.1 10*3/uL (ref 0.0–0.5)
Eosinophils Relative: 2 %
HCT: 31.7 % — ABNORMAL LOW (ref 36.0–46.0)
Hemoglobin: 10 g/dL — ABNORMAL LOW (ref 12.0–15.0)
Immature Granulocytes: 0 %
Lymphocytes Relative: 50 %
Lymphs Abs: 2 10*3/uL (ref 0.7–4.0)
MCH: 23.8 pg — ABNORMAL LOW (ref 26.0–34.0)
MCHC: 31.5 g/dL (ref 30.0–36.0)
MCV: 75.3 fL — ABNORMAL LOW (ref 80.0–100.0)
Monocytes Absolute: 0.9 10*3/uL (ref 0.1–1.0)
Monocytes Relative: 21 %
Neutro Abs: 1.1 10*3/uL — ABNORMAL LOW (ref 1.7–7.7)
Neutrophils Relative %: 27 %
Platelets: 179 10*3/uL (ref 150–400)
RBC: 4.21 MIL/uL (ref 3.87–5.11)
RDW: 14.6 % (ref 11.5–15.5)
WBC: 4.1 10*3/uL (ref 4.0–10.5)
nRBC: 0 % (ref 0.0–0.2)

## 2023-10-07 LAB — HCG, QUANTITATIVE, PREGNANCY: hCG, Beta Chain, Quant, S: 2877 m[IU]/mL — ABNORMAL HIGH (ref <5)

## 2023-10-07 MED ORDER — PRENATAL COMPLETE 14-0.4 MG PO TABS: 1.0000 | ORAL_TABLET | Freq: Every day | ORAL | 3 refills | Status: DC

## 2023-10-07 NOTE — Progress Notes (Signed)
 Written and verbal d/c instructions given by Marcell Barlow CNM and pt voiced understanding. PT then d/c by CNM

## 2023-10-07 NOTE — Transitions of Care (Post Inpatient/ED Visit) (Signed)
   10/07/2023  Name: Alison Cummings MRN: [id number] DOB: 16-Jan-2001  Today's TOC FU Call Status:    Attempted to reach the patient regarding the most recent Inpatient/ED visit.  Follow Up Plan: Additional outreach attempts will be made to reach the patient to complete the Transitions of Care (Post Inpatient/ED visit) call.   Signature  [employer], [place]

## 2023-10-07 NOTE — ED Provider Triage Note (Addendum)
 Emergency Medicine Provider Triage Evaluation Note  Alison Cummings , a 23 y.o. female  was evaluated in triage.  Pt complains of positive pregnancy test yesterday.  Eloped prior to evaluation secondary to long wait time.  States that she has had persistent been bleeding for the past 30 days.  Last normal menstrual period was 114.  She began having her period again about 29 and has been bleeding since that is why she came yesterday.  She has a history of generalized anxiety disorder and persistent tachycardia as evidenced in the vital signs listed below.  She follows with cardiology.  Patient states "I am very nervous."  Review of Systems  Positive: Tachycardia and menstrual bleeding Negative: Pelvic pain  Physical Exam  BP 139/75 (BP Location: Right Arm)   Pulse (!) 130   Temp 98.3 F (36.8 C)   Resp 20   SpO2 97%  Gen:   Awake, no distress   Resp:  Normal effort  MSK:   Moves extremities without difficulty  Other:    Clinical Course as of 10/07/23 2203  Tue Oct 07, 2023  1856 Discussed with CNM Shanda Bumps- patient will be seen in the MAU [AH]    Clinical Course User Index [AH] Arthor Captain, PA-C    Medical Decision Making  Medically screening exam initiated at 2:34 PM.  Appropriate orders placed.  XITLALY AULT was informed that the remainder of the evaluation will be completed by another provider, this initial triage assessment does not replace that evaluation, and the importance of remaining in the ED until their evaluation is complete.   06/11/2023 07/05/2023 07/17/2023 08/19/2023 08/28/2023 08/30/2023  Vitals with BMI        Height 5\' 2"    5\' 2"    5\' 2"     Weight 213 lbs   212 lbs   208 lbs 3 oz    BMI 38.95   38.77   38.07    Systolic 126  113  116  104  114  [Locked]   Diastolic 65  83  78  75  70  [Locked]   Pulse 107 !  90  119 !  137 !  117 !  [Locked]     10/06/2023 10/07/2023  Vitals with BMI    Height 5\' 2"     Weight 203 lbs    BMI 37.12    Systolic 131  139    Diastolic 75  75   Pulse 128 !  130 !      Arthor Captain, PA-C 10/07/23 1436    Arthor Captain, PA-C 10/07/23 2203

## 2023-10-07 NOTE — ED Triage Notes (Signed)
 Seen yesterday for heavy menstrual bleeding. Left AMA due to wait time. Pt also states she had a positive pregnancy test on labs drawn.

## 2023-10-07 NOTE — MAU Note (Addendum)
.  Alison Cummings is a 23 y.o. at Unknown here in MAU reporting having a period 2/14 that lasted 5 days. Bleeding returned last wk and stopped yesterday. She went to the main ED last night but left due to the wait. She saw on MyChart that she was pregnant so she returned to Main ED tonight. They sent her to MAU. She denies pain or VB now. She just wants to know how far pregnant she is  LMP: 2/14 Onset of complaint: n/a Pain score: 0 Vitals:   10/07/23 2010 10/07/23 2012  BP:  123/61  Pulse: (!) 125   Resp: 18   Temp: 98.4 F (36.9 C)   SpO2: 100%      FHT: n/a  Lab orders placed from triage: u/a

## 2023-10-07 NOTE — MAU Provider Note (Signed)
 S [name] is a 23 y.o. G1P0 patient who presents to MAU today with complaint of seen in ED on 3/3 and today 3/4 for heavy vaginal bleeding but has left d/t wait time. Today she had hcg labs and imaging done which showed an elevated hcg but EML was very thin on ultrasound. Patient denies any pain and reports bleeding is light. No fever, chills, N/V/D.   O BP 123/61   Pulse (!) 125   Temp 98.4 F (36.9 C)   Resp 18   Ht 5\' 2"  (1.575 m)   Wt 94.3 kg   LMP 09/19/2023 (Approximate)   SpO2 100%   BMI 38.04 kg/m  Physical Exam Vitals and nursing note reviewed.  Constitutional:      General: She is not in acute distress.    Appearance: She is obese. She is not ill-appearing.  HENT:     Head: Normocephalic.  Cardiovascular:     Rate and Rhythm: Regular rhythm. Tachycardia present.  Pulmonary:     Effort: Pulmonary effort is normal.     Breath sounds: Normal breath sounds.  Musculoskeletal:        General: Normal range of motion.  Skin:    General: Skin is warm and dry.  Neurological:     Mental Status: She is alert and oriented to person, place, and time.  Psychiatric:        Mood and Affect: Mood normal. Affect is flat.        Speech: Speech is delayed.        Behavior: Behavior normal.     Lab Results  Component Value Date   HCGBETAQNT 2,877 (H) 10/07/2023   HCGBETAQNT <1 07/26/2022   HCGBETAQNT <1 05/30/2022       Narrative & Impression  CLINICAL DATA:  Vaginal bleeding in 1st trimester pregnancy.   EXAM: OBSTETRIC <14 WK [place] AND TRANSVAGINAL OB US   TECHNIQUE: Both transabdominal and transvaginal ultrasound examinations were performed for complete evaluation of the gestation as well as the maternal uterus, adnexal regions, and pelvic cul-de-sac. Transvaginal technique was performed to assess early pregnancy.   COMPARISON:  None Available.   FINDINGS: Intrauterine gestational sac: None   Maternal uterus/adnexae: Endometrial thickness  measures 3 mm. Both ovaries are normal in appearance. No mass or abnormal free fluid identified.   IMPRESSION: Pregnancy of unknown anatomic location (no intrauterine gestational sac or adnexal mass identified). Differential diagnosis includes recent spontaneous abortion, IUP too early to visualize, and non-visualized ectopic pregnancy. Recommend followup of beta-hCG levels, and follow up US as warranted clinically.     Electronically Signed   By: Danae Orleans M.D.   On: 10/07/2023 18:17   MDM   MODERATE  - Pregnancy of unknown origin - 48 hour hcg f/u appointment scheduled   I have reviewed the patient chart and performed the physical exam .Medications ordered as stated below.  A/P as described below.  Counseling and education provided and patient agreeable  with plan as described below. Verbalized understanding.     ASSESSMENT Medical screening exam complete  1. Pregnancy of unknown anatomic location (Primary)  2. Elevated serum hCG     PLAN  Future Appointments  Date Time Provider Department Center  10/08/2023 11:20 AM Donell Beers, FNP [facility] None  10/09/2023  8:45 AM [facility] None  10/20/2023  1:30 PM Monge, Petra Kuba, NP [facility] None    Discharge from MAU in stable condition with strict return  precautions Please see AVS for full description of educational information provided both verbally and written Patient given the option of transfer to [facility] for further evaluation or seek care in outpatient facility of choice  List of options for follow-up given  Warning signs for worsening condition that would warrant emergency follow-up discussed Patient may return to MAU as needed   Colman Cater, NP 10/07/2023 8:14 PM

## 2023-10-07 NOTE — Discharge Instructions (Signed)
[place] for [employer] at [employer] [facility]    Phone: [place] for [employer] at [place]   Phone: [place] for [employer] at [place]  Phone: [place] for [employer] at [employer]  Phone: [place] for [employer] at [place]  Phone: [place] for [place] at [facility]   Phone: [facility]       Phone: [phone]  [facility]    Phone: [phone]   [facility]    Phone: [phone]  [facility]    Phone: [place]    Phone: [phone]  [place] [facility]       Phone: [phone]   [place] [facility]  Phone: [place]    Phone: [phone]  [facility]   Phone: [phone]  [facility]      Phone: [phone]  [facility]    Phone: [phone]

## 2023-10-08 ENCOUNTER — Ambulatory Visit: Payer: Self-pay | Admitting: Nurse Practitioner

## 2023-10-09 ENCOUNTER — Other Ambulatory Visit: Payer: Self-pay

## 2023-10-09 ENCOUNTER — Telehealth: Payer: Self-pay

## 2023-10-09 NOTE — Transitions of Care (Post Inpatient/ED Visit) (Signed)
   10/09/2023  Name: Alison Cummings MRN: [id number] DOB: 2000-12-22  Today's TOC FU Call Status: Today's TOC FU Call Status:: Unsuccessful Call (2nd Attempt) Unsuccessful Call (2nd Attempt) Date: 10/09/23  Attempted to reach the patient regarding the most recent Inpatient/ED visit.  Follow Up Plan: Additional outreach attempts will be made to reach the patient to complete the Transitions of Care (Post Inpatient/ED visit) call.   Signature  [employer], [place]

## 2023-10-13 ENCOUNTER — Other Ambulatory Visit: Payer: Self-pay | Admitting: Certified Nurse Midwife

## 2023-10-13 ENCOUNTER — Inpatient Hospital Stay (HOSPITAL_COMMUNITY)
Admission: AD | Admit: 2023-10-13 | Discharge: 2023-10-13 | Disposition: A | Discharge status: Home / Self Care | Attending: Obstetrics and Gynecology | Admitting: Obstetrics and Gynecology

## 2023-10-13 ENCOUNTER — Inpatient Hospital Stay (HOSPITAL_COMMUNITY)

## 2023-10-13 DIAGNOSIS — O3680X0 Pregnancy with inconclusive fetal viability, not applicable or unspecified: Secondary | ICD-10-CM

## 2023-10-13 DIAGNOSIS — Z3A01 Less than 8 weeks gestation of pregnancy: Secondary | ICD-10-CM | POA: Diagnosis not present

## 2023-10-13 LAB — CBC
HCT: 35.1 % — ABNORMAL LOW (ref 36.0–46.0)
Hemoglobin: 11.3 g/dL — ABNORMAL LOW (ref 12.0–15.0)
MCH: 24 pg — ABNORMAL LOW (ref 26.0–34.0)
MCHC: 32.2 g/dL (ref 30.0–36.0)
MCV: 74.7 fL — ABNORMAL LOW (ref 80.0–100.0)
Platelets: 200 10*3/uL (ref 150–400)
RBC: 4.7 MIL/uL (ref 3.87–5.11)
RDW: 14.4 % (ref 11.5–15.5)
WBC: 5.3 10*3/uL (ref 4.0–10.5)
nRBC: 0 % (ref 0.0–0.2)

## 2023-10-13 LAB — ABO/RH: ABO/RH(D): O POS

## 2023-10-13 LAB — HCG, QUANTITATIVE, PREGNANCY: hCG, Beta Chain, Quant, S: 2809 m[IU]/mL — ABNORMAL HIGH (ref <5)

## 2023-10-13 NOTE — MAU Note (Signed)
.  Alison Cummings is a 23 y.o. at Unknown here in MAU reporting: here for repeat hCG. Denies pain, VB, or any other complaints.  Pain score: denies pain Vitals:   10/13/23 1608  BP: 127/66  Pulse: (!) 120  Resp: 16  Temp: 98.4 F (36.9 C)  SpO2: 100%     Lab orders placed from triage: orders in prior to triage

## 2023-10-13 NOTE — MAU Provider Note (Signed)
 None     S [name] is a 23 y.o. No obstetric history on file. pregnant female at Unknown who presents to MAU today with complaint of COP needs. On inspection of EMR, patient was to have follow up for [place] and beta repeat, and was unable due to transportation instability and scheduling of appointment.  Patient reports no further vaginal bleeding or pain since she was last seen.   Of note, patient is unhoused and was initially presenting for COP to obtain shelter. Note provided. Receives care at [facility]. Prenatal records reviewed.  Pertinent items noted in HPI and remainder of comprehensive ROS otherwise negative.   O BP 127/66 (BP Location: Right Arm)   Pulse (!) 120   Temp 98.4 F (36.9 C) (Oral)   Resp 16   Ht 5\' 2"  (1.575 m)   Wt 91 kg   SpO2 100%   BMI 36.71 kg/m  Physical Exam Vitals reviewed.  Constitutional:      Appearance: Normal appearance.  HENT:     Head: Normocephalic.  Cardiovascular:     Rate and Rhythm: Normal rate and regular rhythm.     Pulses: Normal pulses.     Heart sounds: Normal heart sounds.  Pulmonary:     Effort: Pulmonary effort is normal.     Breath sounds: Normal breath sounds.  Skin:    General: Skin is warm and dry.     Capillary Refill: Capillary refill takes less than 2 seconds.  Neurological:     General: No focal deficit present.     Mental Status: She is alert and oriented to person, place, and time.  Psychiatric:        Mood and Affect: Mood normal.        Thought Content: Thought content normal.        Judgment: Judgment normal.      MDM: Previously seen for vaginal bleeding and inconclusive viability.  Comparison of today's findings with those of visit dated 10/07/23. Consulted Alison Cummings who recommends follow up 10/15/23.   MAU Course:  A Pregnancy with inconclusive fetal viability, single or unspecified fetus  Medical screening exam complete No IUP identified. Concern for elevated BetaHcg, though slowly  decreasing. Concerning for AB.   P Discharge from MAU in stable condition with routine precautions Follow up at [facility] as scheduled for ongoing prenatal care, October 15, 2023 for [facility] stat.   Allergies as of 10/13/2023   Not on File      Medication List    You have not been prescribed any medications.     Alison Snowball, MSN, CNM 10/13/2023 6:55 PM  Certified Nurse Midwife, [facility]

## 2023-10-13 NOTE — Discharge Instructions (Signed)
 Alison Cummings,  You were seen today for pregnancy with unknown viability. It is important to go to the listed location to have a blood draw on Wednesday, March 12.   Thank you for trusting [place] to care for you, [place] Dec, Midwife

## 2023-10-20 ENCOUNTER — Ambulatory Visit: Payer: 59 | Attending: Nurse Practitioner | Admitting: Nurse Practitioner

## 2023-10-20 DIAGNOSIS — E0590 Thyrotoxicosis, unspecified without thyrotoxic crisis or storm: Secondary | ICD-10-CM | POA: Diagnosis not present

## 2023-10-20 DIAGNOSIS — O009 Unspecified ectopic pregnancy without intrauterine pregnancy: Secondary | ICD-10-CM | POA: Diagnosis not present

## 2023-10-20 DIAGNOSIS — F439 Reaction to severe stress, unspecified: Secondary | ICD-10-CM | POA: Diagnosis not present

## 2023-10-20 DIAGNOSIS — R441 Visual hallucinations: Secondary | ICD-10-CM | POA: Diagnosis not present

## 2023-10-20 DIAGNOSIS — F489 Nonpsychotic mental disorder, unspecified: Secondary | ICD-10-CM | POA: Diagnosis not present

## 2023-10-20 DIAGNOSIS — N838 Other noninflammatory disorders of ovary, fallopian tube and broad ligament: Secondary | ICD-10-CM | POA: Diagnosis not present

## 2023-10-20 DIAGNOSIS — F909 Attention-deficit hyperactivity disorder, unspecified type: Secondary | ICD-10-CM | POA: Diagnosis not present

## 2023-10-20 NOTE — Progress Notes (Deleted)
 Office Visit    Patient Name: Alison Cummings Date of Encounter: 10/20/2023  Primary Care Provider:  Donell Beers, FNP Primary Cardiologist:  Nicki Guadalajara, MD  Chief Complaint    23 year old female with a history of sinus tachycardia, syncope, hypertension, ADHD, obesity, hyperthyroidism, seizures, anxiety, and depression who presents for follow-up related to syncope and tachycardia.   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   IMO SNOMED Dx Update Oct 2024     High blood pressure 06/11/2023   Hypertension    Hyperthyroidism 06/11/2023   Obesity    Seasonal allergies    Seizures (HCC)    Past Surgical History:  Procedure Laterality Date   ADENOIDECTOMY      Allergies  Allergies  Allergen Reactions   Apple Itching, Swelling and Other (See Comments)    Tongue swells and throat itches   Cherry Itching, Swelling and Other (See Comments)    Tongue swells and throat itches   Ibuprofen Palpitations     Labs/Other Studies Reviewed    The following studies were reviewed today:     Recent Labs: 04/10/2023: ALT 51 08/28/2023: Magnesium 1.8; TSH <0.005 10/07/2023: BUN 7; Creatinine, Ser 0.42; Potassium 4.2; Sodium 134 10/13/2023: Hemoglobin 11.3; Platelets 200  Recent Lipid Panel No results found for: "CHOL", "TRIG", "HDL", "CHOLHDL", "VLDL", "LDLCALC", "LDLDIRECT"  History of Present Illness    23 year old female with the above past medical history including sinus tachycardia, syncope, hypertension, ADHD, obesity, hyperthyroidism, seizures, anxiety, and depression.   She was referred to cardiology for evaluation of syncope, tachycardia.  She had been undergoing job  training in the state of [place] and had 2 episodes of passing out on 2 different days.  She reported loss of consciousness.  She was evaluated in the ED and was noted to be tachycardic, blood pressure was elevated.  Labs were concerning for hyperthyroidism.  She was referred to endocrinology.  She was started on atenolol.  Echocardiogram was ordered but not completed, 14-day Zio patch was ordered but not completed.  She was evaluated in the ED on 08/06/2023 in [place] in the setting of dizziness, housing instability.  Her magnesium was low.  She was advised to take magnesium 400 mg daily.  She was last seen in the office on 08/28/2023 and was stable from a cardiac standpoint.  She did note personal difficulties, she was staying in a shelter but have recently transitioned to her sister's apartment.  She reported not taking her atenolol as prescribed.  14-day ZIO monitor was again ordered but not completed.  Echocardiogram was ordered but not completed.  She presents today for follow-up.  Since her last visit she has  1. History of syncope: Occurred during job training in [place] state, 2 episodes on 2 different days.  She denies any recurrent syncope.  Echo, 14-day ZIO pending as below.   2. Sinus tachycardia/palpitations: EKG today shows sinus tachycardia.  She has not started taking atenolol.  She has not been taking her Focalin either.  Recent ED evaluation in the setting of possible dehydration, hypomagnesemia.  She is generally asymptomatic though she does note occasional palpitations, occasional shortness of breath.  Proceed with echocardiogram, 14-day ZIO monitor (  patient states she never received prior monitor).  Will check CBC, BMET, TSH, magnesium today.  Recommend follow-up with endocrinology.  Reviewed ED precautions.  Encourage adequate hydration, activity as tolerated.   3. Hypertension: BP well controlled. Continue current antihypertensive regimen.    4. Hyperthyroidism: Recommend  follow-up with endocrinology.  Repeat TSH pending.   5. Disposition: Follow-up in   Home Medications    Current Outpatient Medications  Medication Sig Dispense Refill   atenolol (TENORMIN) 50 MG tablet Take 1 tablet (50 mg total) by mouth daily. 90 tablet 0   escitalopram (LEXAPRO) 10 MG tablet Take 1 tablet (10 mg total) by mouth daily for 14 days. 14 tablet 0   metFORMIN (GLUCOPHAGE) 1000 MG tablet Take 1,000 mg by mouth daily.     methimazole (TAPAZOLE) 10 MG tablet Take 1 tablet (10 mg total) by mouth daily. 30 tablet 1   Prenatal Vit-Fe Fumarate-FA (PRENATAL COMPLETE) 14-0.4 MG TABS Take 1 tablet by mouth daily. 60 tablet 3   No current facility-administered medications for this visit.     Review of Systems    ***.  All other systems reviewed and are otherwise negative except as noted above.    Physical Exam    VS:  LMP 09/19/2023 (Approximate)  , BMI There is no height or weight on file to calculate BMI.     GEN: Well nourished, well developed, in no acute distress. HEENT: normal. Neck: Supple, no JVD, carotid bruits, or masses. Cardiac: RRR, no murmurs, rubs, or gallops. No clubbing, cyanosis, edema.  Radials/DP/PT 2+ and equal bilaterally.  Respiratory:  Respirations regular and unlabored, clear to auscultation bilaterally. GI: Soft, nontender, nondistended, BS + x 4. MS: no deformity or atrophy. Skin: warm and dry, no rash. Neuro:  Strength and sensation are intact. Psych: Normal affect.  Accessory Clinical Findings    ECG personally reviewed by me today -    - no acute changes.   Lab Results  Component Value Date   WBC 5.3 10/13/2023   HGB 11.3 (L) 10/13/2023   HCT 35.1 (L) 10/13/2023   MCV 74.7 (L) 10/13/2023   PLT 200 10/13/2023   Lab Results  Component Value Date   CREATININE 0.42 (L) 10/07/2023   BUN 7 10/07/2023   NA 134 (L) 10/07/2023   K 4.2 10/07/2023   CL 101 10/07/2023   CO2 23 10/07/2023   Lab Results  Component Value Date   ALT 51 (H)  04/10/2023   AST 37 04/10/2023   ALKPHOS 68 04/10/2023   BILITOT 0.5 04/10/2023   No results found for: "CHOL", "HDL", "LDLCALC", "LDLDIRECT", "TRIG", "CHOLHDL"  Lab Results  Component Value Date   HGBA1C 5.1 06/11/2023    Assessment & Plan    1.  ***  No BP recorded.  {Refresh Note OR Click here to enter BP  :1}***   Joylene Grapes, NP 10/20/2023, 6:59 AM

## 2023-10-21 DIAGNOSIS — O99340 Other mental disorders complicating pregnancy, unspecified trimester: Secondary | ICD-10-CM | POA: Diagnosis not present

## 2023-10-21 DIAGNOSIS — F29 Unspecified psychosis not due to a substance or known physiological condition: Secondary | ICD-10-CM | POA: Diagnosis not present

## 2023-10-21 DIAGNOSIS — Z5986 Financial insecurity: Secondary | ICD-10-CM | POA: Diagnosis not present

## 2023-10-21 DIAGNOSIS — N838 Other noninflammatory disorders of ovary, fallopian tube and broad ligament: Secondary | ICD-10-CM | POA: Diagnosis not present

## 2023-10-21 DIAGNOSIS — O99281 Endocrine, nutritional and metabolic diseases complicating pregnancy, first trimester: Secondary | ICD-10-CM | POA: Diagnosis not present

## 2023-10-21 DIAGNOSIS — R000 Tachycardia, unspecified: Secondary | ICD-10-CM | POA: Diagnosis not present

## 2023-10-21 DIAGNOSIS — F909 Attention-deficit hyperactivity disorder, unspecified type: Secondary | ICD-10-CM | POA: Diagnosis not present

## 2023-10-21 DIAGNOSIS — Z59819 Housing instability, housed unspecified: Secondary | ICD-10-CM | POA: Diagnosis not present

## 2023-10-21 DIAGNOSIS — G479 Sleep disorder, unspecified: Secondary | ICD-10-CM | POA: Diagnosis not present

## 2023-10-21 DIAGNOSIS — R441 Visual hallucinations: Secondary | ICD-10-CM | POA: Diagnosis not present

## 2023-10-21 DIAGNOSIS — F4322 Adjustment disorder with anxiety: Secondary | ICD-10-CM | POA: Diagnosis not present

## 2023-10-21 DIAGNOSIS — F32A Depression, unspecified: Secondary | ICD-10-CM | POA: Diagnosis not present

## 2023-10-21 DIAGNOSIS — Z5982 Transportation insecurity: Secondary | ICD-10-CM | POA: Diagnosis not present

## 2023-10-21 DIAGNOSIS — F39 Unspecified mood [affective] disorder: Secondary | ICD-10-CM | POA: Diagnosis not present

## 2023-10-21 DIAGNOSIS — D649 Anemia, unspecified: Secondary | ICD-10-CM | POA: Diagnosis not present

## 2023-10-21 DIAGNOSIS — E0501 Thyrotoxicosis with diffuse goiter with thyrotoxic crisis or storm: Secondary | ICD-10-CM | POA: Diagnosis not present

## 2023-10-21 DIAGNOSIS — Z5941 Food insecurity: Secondary | ICD-10-CM | POA: Diagnosis not present

## 2023-10-21 DIAGNOSIS — F419 Anxiety disorder, unspecified: Secondary | ICD-10-CM | POA: Diagnosis not present

## 2023-10-21 DIAGNOSIS — E0591 Thyrotoxicosis, unspecified with thyrotoxic crisis or storm: Secondary | ICD-10-CM | POA: Diagnosis not present

## 2023-10-21 DIAGNOSIS — F913 Oppositional defiant disorder: Secondary | ICD-10-CM | POA: Diagnosis not present

## 2023-10-21 DIAGNOSIS — R45851 Suicidal ideations: Secondary | ICD-10-CM | POA: Diagnosis not present

## 2023-10-21 DIAGNOSIS — O99011 Anemia complicating pregnancy, first trimester: Secondary | ICD-10-CM | POA: Diagnosis not present

## 2023-10-21 DIAGNOSIS — O99341 Other mental disorders complicating pregnancy, first trimester: Secondary | ICD-10-CM | POA: Diagnosis not present

## 2023-10-21 DIAGNOSIS — F489 Nonpsychotic mental disorder, unspecified: Secondary | ICD-10-CM | POA: Diagnosis not present

## 2023-10-21 DIAGNOSIS — Z79899 Other long term (current) drug therapy: Secondary | ICD-10-CM | POA: Diagnosis not present

## 2023-10-21 DIAGNOSIS — O26891 Other specified pregnancy related conditions, first trimester: Secondary | ICD-10-CM | POA: Diagnosis not present

## 2023-10-21 DIAGNOSIS — F4310 Post-traumatic stress disorder, unspecified: Secondary | ICD-10-CM | POA: Diagnosis not present

## 2023-10-21 DIAGNOSIS — Z1152 Encounter for screening for COVID-19: Secondary | ICD-10-CM | POA: Diagnosis not present

## 2023-10-21 DIAGNOSIS — F439 Reaction to severe stress, unspecified: Secondary | ICD-10-CM | POA: Diagnosis not present

## 2023-10-21 DIAGNOSIS — E0500 Thyrotoxicosis with diffuse goiter without thyrotoxic crisis or storm: Secondary | ICD-10-CM | POA: Diagnosis not present

## 2023-10-21 DIAGNOSIS — O99891 Other specified diseases and conditions complicating pregnancy: Secondary | ICD-10-CM | POA: Diagnosis not present

## 2023-10-21 DIAGNOSIS — O99280 Endocrine, nutritional and metabolic diseases complicating pregnancy, unspecified trimester: Secondary | ICD-10-CM | POA: Diagnosis not present

## 2023-10-21 DIAGNOSIS — R451 Restlessness and agitation: Secondary | ICD-10-CM | POA: Diagnosis not present

## 2023-10-21 DIAGNOSIS — E0590 Thyrotoxicosis, unspecified without thyrotoxic crisis or storm: Secondary | ICD-10-CM | POA: Diagnosis not present

## 2023-10-21 DIAGNOSIS — D509 Iron deficiency anemia, unspecified: Secondary | ICD-10-CM | POA: Diagnosis not present

## 2023-10-21 DIAGNOSIS — O009 Unspecified ectopic pregnancy without intrauterine pregnancy: Secondary | ICD-10-CM | POA: Diagnosis not present

## 2023-10-21 DIAGNOSIS — F6381 Intermittent explosive disorder: Secondary | ICD-10-CM | POA: Diagnosis not present

## 2023-10-21 DIAGNOSIS — O00102 Left tubal pregnancy without intrauterine pregnancy: Secondary | ICD-10-CM | POA: Diagnosis not present

## 2023-11-05 NOTE — Congregational Nurse Program (Signed)
 Client presented RN's office reporting that she has not taken her prescribed medications since late 2024, including treatments for Graves disease and behavioral health needs. Client expressed concerns about her physical and mental health due to lapses in medication. RN advised client to visit the mobile clinic at [facility] today for evaluation and support. She is reporting that she has "weird sensations in/ on her arm and knows that it is not real and the medications address this sensation and keep me calm." Plans were also discussed to assist client in scheduling a PCP appointment tomorrow. Client shared she needs to follow up with cardiology and OBGYN to address health concerns. Due to financial barriers, client was [facility] on [address] for assistance with medications. RN will continue to support client through care coordination and follow up as needed.

## 2023-11-06 DIAGNOSIS — R000 Tachycardia, unspecified: Secondary | ICD-10-CM | POA: Diagnosis not present

## 2023-11-06 DIAGNOSIS — R441 Visual hallucinations: Secondary | ICD-10-CM | POA: Diagnosis not present

## 2023-11-06 DIAGNOSIS — E0591 Thyrotoxicosis, unspecified with thyrotoxic crisis or storm: Secondary | ICD-10-CM | POA: Diagnosis not present

## 2023-11-06 NOTE — Congregational Nurse Program (Signed)
 RN assisted client with getting re-established with PCP and appointment made on April 15th at 0920 with Dr. Geoffery Spruce. RN also reached out to OBGYN office in [place] for labs for client to follow up on recent ectopic pregnancy and she can walk in to office M-F 8-4:30 pm to get labs drawn for further recommendations. RN also helped client schedule a cardiologist appointment on May 14th at 10:30 to assist with medication for client heart rate which is currently 124. She is also in need of her mental health medications to assist with and RN has given bus passes to client to help her get seen at [facility]. Lastly, RN called [facility] to help with clients methimazole prescription and refill sent and she will pick up tomorrow.

## 2023-11-07 DIAGNOSIS — F913 Oppositional defiant disorder: Secondary | ICD-10-CM | POA: Diagnosis not present

## 2023-11-07 DIAGNOSIS — R451 Restlessness and agitation: Secondary | ICD-10-CM | POA: Diagnosis not present

## 2023-11-07 DIAGNOSIS — Z79899 Other long term (current) drug therapy: Secondary | ICD-10-CM | POA: Diagnosis not present

## 2023-11-07 DIAGNOSIS — F1090 Alcohol use, unspecified, uncomplicated: Secondary | ICD-10-CM | POA: Diagnosis not present

## 2023-11-07 DIAGNOSIS — Z794 Long term (current) use of insulin: Secondary | ICD-10-CM | POA: Diagnosis not present

## 2023-11-07 DIAGNOSIS — Z5941 Food insecurity: Secondary | ICD-10-CM | POA: Diagnosis not present

## 2023-11-07 DIAGNOSIS — R442 Other hallucinations: Secondary | ICD-10-CM | POA: Diagnosis not present

## 2023-11-07 DIAGNOSIS — E0500 Thyrotoxicosis with diffuse goiter without thyrotoxic crisis or storm: Secondary | ICD-10-CM | POA: Diagnosis not present

## 2023-11-07 DIAGNOSIS — D709 Neutropenia, unspecified: Secondary | ICD-10-CM | POA: Diagnosis not present

## 2023-11-07 DIAGNOSIS — G40909 Epilepsy, unspecified, not intractable, without status epilepticus: Secondary | ICD-10-CM | POA: Diagnosis not present

## 2023-11-07 DIAGNOSIS — E0591 Thyrotoxicosis, unspecified with thyrotoxic crisis or storm: Secondary | ICD-10-CM | POA: Diagnosis not present

## 2023-11-07 DIAGNOSIS — U071 COVID-19: Secondary | ICD-10-CM | POA: Diagnosis not present

## 2023-11-07 DIAGNOSIS — F6381 Intermittent explosive disorder: Secondary | ICD-10-CM | POA: Diagnosis not present

## 2023-11-07 DIAGNOSIS — E0501 Thyrotoxicosis with diffuse goiter with thyrotoxic crisis or storm: Secondary | ICD-10-CM | POA: Diagnosis not present

## 2023-11-07 DIAGNOSIS — R443 Hallucinations, unspecified: Secondary | ICD-10-CM | POA: Diagnosis not present

## 2023-11-07 DIAGNOSIS — F39 Unspecified mood [affective] disorder: Secondary | ICD-10-CM | POA: Diagnosis not present

## 2023-11-07 DIAGNOSIS — R441 Visual hallucinations: Secondary | ICD-10-CM | POA: Diagnosis not present

## 2023-11-07 DIAGNOSIS — Z6837 Body mass index (BMI) 37.0-37.9, adult: Secondary | ICD-10-CM | POA: Diagnosis not present

## 2023-11-07 DIAGNOSIS — Z8659 Personal history of other mental and behavioral disorders: Secondary | ICD-10-CM | POA: Diagnosis not present

## 2023-11-07 DIAGNOSIS — E0590 Thyrotoxicosis, unspecified without thyrotoxic crisis or storm: Secondary | ICD-10-CM | POA: Diagnosis not present

## 2023-11-07 DIAGNOSIS — E669 Obesity, unspecified: Secondary | ICD-10-CM | POA: Diagnosis not present

## 2023-11-07 DIAGNOSIS — F909 Attention-deficit hyperactivity disorder, unspecified type: Secondary | ICD-10-CM | POA: Diagnosis not present

## 2023-11-07 DIAGNOSIS — F4310 Post-traumatic stress disorder, unspecified: Secondary | ICD-10-CM | POA: Diagnosis not present

## 2023-11-07 DIAGNOSIS — F1290 Cannabis use, unspecified, uncomplicated: Secondary | ICD-10-CM | POA: Diagnosis not present

## 2023-11-07 DIAGNOSIS — F419 Anxiety disorder, unspecified: Secondary | ICD-10-CM | POA: Diagnosis not present

## 2023-11-07 DIAGNOSIS — I10 Essential (primary) hypertension: Secondary | ICD-10-CM | POA: Diagnosis not present

## 2023-11-07 DIAGNOSIS — F17210 Nicotine dependence, cigarettes, uncomplicated: Secondary | ICD-10-CM | POA: Diagnosis not present

## 2023-11-07 DIAGNOSIS — O3680X0 Pregnancy with inconclusive fetal viability, not applicable or unspecified: Secondary | ICD-10-CM | POA: Diagnosis not present

## 2023-11-07 DIAGNOSIS — G479 Sleep disorder, unspecified: Secondary | ICD-10-CM | POA: Diagnosis not present

## 2023-11-07 DIAGNOSIS — Z91148 Patient's other noncompliance with medication regimen for other reason: Secondary | ICD-10-CM | POA: Diagnosis not present

## 2023-11-18 ENCOUNTER — Inpatient Hospital Stay: Payer: Self-pay | Admitting: Nurse Practitioner

## 2023-11-25 ENCOUNTER — Other Ambulatory Visit: Payer: Self-pay

## 2023-11-25 ENCOUNTER — Emergency Department (HOSPITAL_COMMUNITY)
Admission: EM | Admit: 2023-11-25 | Discharge: 2023-11-26 | Disposition: A | Discharge status: Home / Self Care | Attending: Emergency Medicine | Admitting: Emergency Medicine

## 2023-11-25 ENCOUNTER — Emergency Department (HOSPITAL_COMMUNITY)

## 2023-11-25 DIAGNOSIS — R0789 Other chest pain: Secondary | ICD-10-CM | POA: Diagnosis not present

## 2023-11-25 DIAGNOSIS — R079 Chest pain, unspecified: Secondary | ICD-10-CM | POA: Diagnosis not present

## 2023-11-25 DIAGNOSIS — R000 Tachycardia, unspecified: Secondary | ICD-10-CM | POA: Insufficient documentation

## 2023-11-25 DIAGNOSIS — E0590 Thyrotoxicosis, unspecified without thyrotoxic crisis or storm: Secondary | ICD-10-CM | POA: Diagnosis not present

## 2023-11-25 DIAGNOSIS — R069 Unspecified abnormalities of breathing: Secondary | ICD-10-CM | POA: Diagnosis not present

## 2023-11-25 DIAGNOSIS — R531 Weakness: Secondary | ICD-10-CM | POA: Diagnosis not present

## 2023-11-25 DIAGNOSIS — D649 Anemia, unspecified: Secondary | ICD-10-CM | POA: Insufficient documentation

## 2023-11-25 LAB — CBC
HCT: 35.5 % — ABNORMAL LOW (ref 36.0–46.0)
Hemoglobin: 11.1 g/dL — ABNORMAL LOW (ref 12.0–15.0)
MCH: 23.7 pg — ABNORMAL LOW (ref 26.0–34.0)
MCHC: 31.3 g/dL (ref 30.0–36.0)
MCV: 75.7 fL — ABNORMAL LOW (ref 80.0–100.0)
Platelets: 219 10*3/uL (ref 150–400)
RBC: 4.69 MIL/uL (ref 3.87–5.11)
RDW: 15.7 % — ABNORMAL HIGH (ref 11.5–15.5)
WBC: 6 10*3/uL (ref 4.0–10.5)
nRBC: 0 % (ref 0.0–0.2)

## 2023-11-25 NOTE — ED Provider Triage Note (Signed)
 Emergency Medicine Provider Triage Evaluation Note  Alison Cummings , a 23 y.o. female  was evaluated in triage.  Pt complains of global weakness, chest pain today, palpitations. Reports she didn't get her meds in jail. Also reports bugs crawling on her. Hx of hyperthyroidism.   Review of Systems  Positive: Chest pain Negative: sob  Physical Exam  BP 116/64   Pulse (!) 109   Temp 98.7 F (37.1 C) (Oral)   Resp 17   LMP 09/19/2023 (Approximate)   SpO2 100%  Gen:   Awake, no distress   Resp:  Normal effort  MSK:   Moves extremities without difficulty  Other:    Medical Decision Making  Medically screening exam initiated at 11:25 PM.  Appropriate orders placed.  Alison Cummings was informed that the remainder of the evaluation will be completed by another provider, this initial triage assessment does not replace that evaluation, and the importance of remaining in the ED until their evaluation is complete.     Timmy Forbes, [place] 11/25/23 2327

## 2023-11-25 NOTE — ED Triage Notes (Signed)
 Pt arrives via [facility]. Per report, pt c/o have SOB, no signs of SOB, unsure if she is having a thyroid  storm because she has not had her meds in 3 weeks. VSS.

## 2023-11-26 LAB — COMPREHENSIVE METABOLIC PANEL WITH GFR
ALT: 34 U/L (ref 0–44)
AST: 32 U/L (ref 15–41)
Albumin: 3.6 g/dL (ref 3.5–5.0)
Alkaline Phosphatase: 112 U/L (ref 38–126)
Anion gap: 8 (ref 5–15)
BUN: 16 mg/dL (ref 6–20)
CO2: 24 mmol/L (ref 22–32)
Calcium: 8.7 mg/dL — ABNORMAL LOW (ref 8.9–10.3)
Chloride: 106 mmol/L (ref 98–111)
Creatinine, Ser: 0.54 mg/dL (ref 0.44–1.00)
GFR, Estimated: >60 mL/min (ref >60)
Glucose, Bld: 97 mg/dL (ref 70–99)
Potassium: 3.8 mmol/L (ref 3.5–5.1)
Sodium: 138 mmol/L (ref 135–145)
Total Bilirubin: 0.7 mg/dL (ref 0.0–1.2)
Total Protein: 7.1 g/dL (ref 6.5–8.1)

## 2023-11-26 LAB — T4, FREE: Free T4: 5.35 ng/dL — ABNORMAL HIGH (ref 0.61–1.12)

## 2023-11-26 LAB — TROPONIN I (HIGH SENSITIVITY): Troponin I (High Sensitivity): 3 ng/L (ref <18)

## 2023-11-26 LAB — RAPID URINE DRUG SCREEN, HOSP PERFORMED
Amphetamines: NOT DETECTED
Barbiturates: NOT DETECTED
Benzodiazepines: NOT DETECTED
Cocaine: NOT DETECTED
Opiates: NOT DETECTED
Tetrahydrocannabinol: NOT DETECTED

## 2023-11-26 LAB — HCG, QUANTITATIVE, PREGNANCY
hCG, Beta Chain, Quant, S: 23 m[IU]/mL — ABNORMAL HIGH (ref <5)
hCG, Beta Chain, Quant, S: 21 m[IU]/mL — ABNORMAL HIGH (ref <5)

## 2023-11-26 LAB — TSH: TSH: <0.01 u[IU]/mL — ABNORMAL LOW (ref 0.350–4.500)

## 2023-11-26 MED ORDER — METOPROLOL SUCCINATE ER 50 MG PO TB24: 50.0000 mg | ORAL_TABLET | Freq: Every day | ORAL | 1 refills | Status: DC

## 2023-11-26 MED ORDER — METOPROLOL TARTRATE 25 MG PO TABS
50.0000 mg | ORAL_TABLET | Freq: Once | ORAL | Status: AC
  Administered 2023-11-26: 50 mg via ORAL
  Filled 2023-11-26: qty 2

## 2023-11-26 MED ORDER — HYDROXYZINE HCL 25 MG PO TABS: 25.0000 mg | ORAL_TABLET | Freq: Four times a day (QID) | ORAL | 0 refills | Status: DC | PRN

## 2023-11-26 MED ORDER — LORAZEPAM 1 MG PO TABS
1.0000 mg | ORAL_TABLET | Freq: Once | ORAL | Status: AC
  Administered 2023-11-26: 1 mg via ORAL
  Filled 2023-11-26: qty 1

## 2023-11-26 MED ORDER — METHIMAZOLE 10 MG PO TABS
10.0000 mg | ORAL_TABLET | Freq: Once | ORAL | Status: AC
  Administered 2023-11-26: 10 mg via ORAL
  Filled 2023-11-26: qty 1

## 2023-11-26 MED ORDER — METHIMAZOLE 10 MG PO TABS: 10.0000 mg | ORAL_TABLET | Freq: Two times a day (BID) | ORAL | 1 refills | Status: DC

## 2023-11-26 NOTE — ED Provider Notes (Signed)
 [place] EMERGENCY DEPARTMENT AT [facility] Provider Note   CSN: [id number] Arrival date & time: 11/25/23  2210     History  Chief Complaint  Patient presents with   Weakness    Patient stated she has thyroid  issues. Patient recently went to jail and was not given her medication she has been very hot even with air on in the house. "Heart beating fast tonight and feeling like I was going to pass out/weak" Has not had medication since Saturday. Patient on metoprolol  and doesn't not know the other medication. Heart rate 107 in triage. Hx anxiety    Alison Cummings is a 23 y.o. female past medical history significant for ADHD, anxiety, hyperthyroidism who presents reporting that she has not had her thyroid  medication in 3 weeks secondary to recent stay in prison.  She reports heart racing, sensation of lightheadedness, feeling like she might pass out.  She endorses some chest tightness and shortness of breath.  She reports that symptoms are improved with rest.  No previous history of ACS.  Was admitted in early April secondary to thyrotoxicosis.   Weakness      Home Medications Prior to Admission medications   Medication Sig Start Date End Date Taking? Authorizing Provider  hydrOXYzine  (ATARAX ) 25 MG tablet Take 1 tablet (25 mg total) by mouth every 6 (six) hours as needed for itching or anxiety. 11/26/23  Yes Kassady Laboy H, PA-C  metoprolol  succinate (TOPROL -XL) 50 MG 24 hr tablet Take 1 tablet (50 mg total) by mouth daily. 11/26/23  Yes Destiney Sanabia H, PA-C  atenolol  (TENORMIN ) 50 MG tablet Take 1 tablet (50 mg total) by mouth daily. 07/17/23   Millicent Ally, MD  escitalopram  (LEXAPRO ) 10 MG tablet Take 1 tablet (10 mg total) by mouth daily for 14 days. 08/30/23 09/13/23  Bobbitt, Shalon E, NP  metFORMIN (GLUCOPHAGE) 1000 MG tablet Take 1,000 mg by mouth daily.    [provider]  methimazole  (TAPAZOLE ) 10 MG tablet Take 1 tablet (10 mg total) by  mouth 2 (two) times daily. 11/26/23   Devone Tousley H, PA-C  Prenatal Vit-Fe Fumarate-FA (PRENATAL COMPLETE) 14-0.4 MG TABS Take 1 tablet by mouth daily. 10/07/23   Cherlynn Cornfield, NP      Allergies    Apple, Denman Fischer, and Ibuprofen     Review of Systems   Review of Systems  Neurological:  Positive for weakness.  All other systems reviewed and are negative.   Physical Exam Updated Vital Signs BP 118/64 (BP Location: Left Arm)   Pulse 80   Temp 97.6 F (36.4 C) (Oral)   Resp 17   LMP 09/19/2023 (Approximate)   SpO2 100%  Physical Exam Vitals and nursing note reviewed.  Constitutional:      General: She is not in acute distress.    Appearance: Normal appearance.  HENT:     Head: Normocephalic and atraumatic.  Eyes:     General:        Right eye: No discharge.        Left eye: No discharge.  Cardiovascular:     Rate and Rhythm: Regular rhythm. Tachycardia present.     Heart sounds: No murmur heard.    No friction rub. No gallop.  Pulmonary:     Effort: Pulmonary effort is normal.     Breath sounds: Normal breath sounds.  Abdominal:     General: Bowel sounds are normal.     Palpations: Abdomen is soft.  Skin:  General: Skin is warm and dry.     Capillary Refill: Capillary refill takes less than 2 seconds.  Neurological:     Mental Status: She is alert and oriented to person, place, and time.  Psychiatric:        Mood and Affect: Mood normal.        Behavior: Behavior normal.     Comments: Anxious mood     ED Results / Procedures / Treatments   Labs (all labs ordered are listed, but only abnormal results are displayed) Labs Reviewed  CBC - Abnormal; Notable for the following components:      Result Value   Hemoglobin 11.1 (*)    HCT 35.5 (*)    MCV 75.7 (*)    MCH 23.7 (*)    RDW 15.7 (*)    All other components within normal limits  HCG, QUANTITATIVE, PREGNANCY - Abnormal; Notable for the following components:   hCG, Beta Chain, Quant, S 23 (*)     All other components within normal limits  COMPREHENSIVE METABOLIC PANEL WITH GFR - Abnormal; Notable for the following components:   Calcium 8.7 (*)    All other components within normal limits  TSH - Abnormal; Notable for the following components:   TSH <0.010 (*)    All other components within normal limits  HCG, QUANTITATIVE, PREGNANCY - Abnormal; Notable for the following components:   hCG, Beta Chain, Quant, S 21 (*)    All other components within normal limits  RAPID URINE DRUG SCREEN, HOSP PERFORMED  T4, FREE  TROPONIN I (HIGH SENSITIVITY)    EKG None  Radiology DG Chest 2 View Result Date: 11/25/2023 CLINICAL DATA:  Chest pain EXAM: CHEST - 2 VIEW COMPARISON:  Chest x-ray 07/05/2023 FINDINGS: The heart size and mediastinal contours are within normal limits. Both lungs are clear. The visualized skeletal structures are unremarkable. IMPRESSION: No active cardiopulmonary disease. Electronically Signed   By: Tyron Gallon M.D.   On: 11/25/2023 23:52    Procedures Procedures    Medications Ordered in ED Medications  methimazole  (TAPAZOLE ) tablet 10 mg (10 mg Oral Given 11/26/23 0306)  metoprolol  tartrate (LOPRESSOR ) tablet 50 mg (50 mg Oral Given 11/26/23 0305)  LORazepam  (ATIVAN ) tablet 1 mg (1 mg Oral Given 11/26/23 0305)    ED Course/ Medical Decision Making/ A&P Clinical Course as of 11/26/23 0446  Wed Nov 26, 2023  0353 Initially suspected that hCG was falsely elevated, rechecked and still mildly elevated at 21, looking back at patient's chart she was pregnant 1 month ago, still some lingering hCG and blood work secondary to this, very low clinical suspicion of true less than 1 week pregnancy. [CP]    Clinical Course User Index [CP] Nelly Banco, PA-C                                 Medical Decision Making Amount and/or Complexity of Data Reviewed Labs: ordered.  Risk Prescription drug management.   This patient is a 23 y.o. female  who presents to  the ED for concern of anxiousness, feeling of skin crawling, feeling of heart racing, chest pain.   Differential diagnoses prior to evaluation: The emergent differential diagnosis includes, but is not limited to,  ACS, AAS, PE, Mallory-Weiss, Boerhaave's, Pneumonia, acute bronchitis, asthma or COPD exacerbation, anxiety, MSK pain or traumatic injury to the chest, acid reflux versus other, overall with high clinical suspicion for hyperthyroid  presentation including some tachycardia and anxiety related to elevated thyroid  levels given her history of same and reportedly not taking her home medication.. This is not an exhaustive differential.   Past Medical History / Co-morbidities / Social History: ADHD, hypothyroidism, hypertension  Additional history: Chart reviewed. Pertinent results include: Extensively reviewed lab work, imaging from recent previous emergency department visits, admission at the beginning of this month for thyrotoxicosis, notably she was treated recently for ectopic pregnancy  Physical Exam: Physical exam performed. The pertinent findings include: Initially tachycardic, pulse 109, otherwise well-appearing, no accessory heart or lung sounds.  Vitals improved on recheck, pulse of 80, she remains normotensive, blood pressure 118/64.  Lab Tests/Imaging studies: I personally interpreted labs/imaging and the pertinent results include: CBC with very mild anemia, hemoglobin 11.1, troponin negative x 1 in context of no active chest pain, symptoms ongoing for several days.  Her initial beta-hCG was elevated 23, recheck to ensure no lab error and still mildly elevated 21, suspect likely related to her recent pregnancy.  She reports normal menses since being treated for her ectopic pregnancy.  Her T4 is still pending, but given that her TSH is undetectable suspect that she is having a hyperthyroid exacerbation, no evidence of acute thyroid  storm on my exam, vitals improved with medication.  I  independently interpreted plain film chest x-ray which shows no evidence of acute thoracic abnormality.  I agree with the radiologist interpretation.  Cardiac monitoring: EKG obtained and interpreted by myself and attending physician which shows: Sinus tachycardia, no acute ST-T changes   Medications: I ordered medication including ordered home metoprolol , methimazole  for hyperthyroid symptoms, ordered Ativan  due to patient's reported skin crawling sensation, likely related to her hyperthyroid.  I have reviewed the patients home medicines and have made adjustments as needed.  Refilled patient's home medications and she reports that she has been out, given a short prescription for Atarax  to help with her anxiety symptoms until her medications begin to take effect, encourage very close follow-up with her PCP for thyroid  recheck and reassessment.   Disposition: After consideration of the diagnostic results and the patients response to treatment, I feel that patient is having a hypothyroid exacerbation but no evidence of acute thyroid  storm at this time, plan to discharge with close PCP follow-up as discussed above.   emergency department workup does not suggest an emergent condition requiring admission or immediate intervention beyond what has been performed at this time. The plan is: as above. The patient is safe for discharge and has been instructed to return immediately for worsening symptoms, change in symptoms or any other concerns.  Final Clinical Impression(s) / ED Diagnoses Final diagnoses:  Hyperthyroidism    Rx / DC Orders ED Discharge Orders          Ordered    methimazole  (TAPAZOLE ) 10 MG tablet  2 times daily        11/26/23 0438    metoprolol  succinate (TOPROL -XL) 50 MG 24 hr tablet  Daily        11/26/23 0438    hydrOXYzine  (ATARAX ) 25 MG tablet  Every 6 hours PRN        11/26/23 0439              Indalecio Malmstrom H, PA-C 11/26/23 0446    Lindle Rhea,  MD 11/26/23 [phone]

## 2023-11-26 NOTE — Discharge Instructions (Addendum)
 Please take your home thyroid  medications, blood pressure medication as prescribed.  You can use the hydroxyzine  up to every 6 hours to help with itching/anxiety until your thyroid  medication starts to take effect, please follow-up closely with your primary care doctor.  Please return if you have significant worsening symptoms despite treatment.

## 2023-11-27 ENCOUNTER — Telehealth: Payer: Self-pay

## 2023-11-27 NOTE — Transitions of Care (Post Inpatient/ED Visit) (Cosign Needed)
   11/27/2023  Name: Alison Cummings MRN: [id number] DOB: Jan 21, 2001  Today's TOC FU Call Status: Today's TOC FU Call Status:: Unsuccessful Call (1st Attempt) Unsuccessful Call (1st Attempt) Date: 11/27/23  Attempted to reach the patient regarding the most recent Inpatient/ED visit.  Follow Up Plan: No further outreach attempts will be made at this time. We have been unable to contact the patient.  Signature Julian Obey RMA

## 2023-12-01 ENCOUNTER — Encounter (HOSPITAL_COMMUNITY): Payer: Self-pay

## 2023-12-01 ENCOUNTER — Emergency Department (HOSPITAL_COMMUNITY)
Admission: EM | Admit: 2023-12-01 | Discharge: 2023-12-02 | Disposition: A | Discharge status: Home / Self Care | Attending: Emergency Medicine | Admitting: Emergency Medicine

## 2023-12-01 ENCOUNTER — Other Ambulatory Visit: Payer: Self-pay

## 2023-12-01 DIAGNOSIS — R531 Weakness: Secondary | ICD-10-CM | POA: Diagnosis not present

## 2023-12-01 DIAGNOSIS — Z79899 Other long term (current) drug therapy: Secondary | ICD-10-CM | POA: Insufficient documentation

## 2023-12-01 DIAGNOSIS — R891 Abnormal level of hormones in specimens from other organs, systems and tissues: Secondary | ICD-10-CM | POA: Diagnosis not present

## 2023-12-01 DIAGNOSIS — I10 Essential (primary) hypertension: Secondary | ICD-10-CM | POA: Diagnosis not present

## 2023-12-01 DIAGNOSIS — E0590 Thyrotoxicosis, unspecified without thyrotoxic crisis or storm: Secondary | ICD-10-CM | POA: Insufficient documentation

## 2023-12-01 DIAGNOSIS — R7989 Other specified abnormal findings of blood chemistry: Secondary | ICD-10-CM

## 2023-12-01 LAB — T4, FREE: Free T4: 1.87 ng/dL — ABNORMAL HIGH (ref 0.61–1.12)

## 2023-12-01 LAB — BASIC METABOLIC PANEL WITH GFR
Anion gap: 11 (ref 5–15)
BUN: 16 mg/dL (ref 6–20)
CO2: 22 mmol/L (ref 22–32)
Calcium: 9 mg/dL (ref 8.9–10.3)
Chloride: 104 mmol/L (ref 98–111)
Creatinine, Ser: 0.71 mg/dL (ref 0.44–1.00)
GFR, Estimated: >60 mL/min (ref >60)
Glucose, Bld: 78 mg/dL (ref 70–99)
Potassium: 4 mmol/L (ref 3.5–5.1)
Sodium: 137 mmol/L (ref 135–145)

## 2023-12-01 LAB — CBC
HCT: 40.5 % (ref 36.0–46.0)
Hemoglobin: 12.6 g/dL (ref 12.0–15.0)
MCH: 23.6 pg — ABNORMAL LOW (ref 26.0–34.0)
MCHC: 31.1 g/dL (ref 30.0–36.0)
MCV: 76 fL — ABNORMAL LOW (ref 80.0–100.0)
Platelets: 171 10*3/uL (ref 150–400)
RBC: 5.33 MIL/uL — ABNORMAL HIGH (ref 3.87–5.11)
RDW: 15.4 % (ref 11.5–15.5)
WBC: 5.2 10*3/uL (ref 4.0–10.5)
nRBC: 0 % (ref 0.0–0.2)

## 2023-12-01 LAB — HCG, SERUM, QUALITATIVE: Preg, Serum: POSITIVE — AB

## 2023-12-01 LAB — TSH: TSH: <0.01 u[IU]/mL — ABNORMAL LOW (ref 0.350–4.500)

## 2023-12-01 LAB — CBG MONITORING, ED: Glucose-Capillary: 78 mg/dL (ref 70–99)

## 2023-12-01 NOTE — ED Triage Notes (Signed)
 Pt c.o general weakness for about 3 days. Pt recently seen at [facility] and restarted her thyroid  and HTN meds. Denies pain.

## 2023-12-02 LAB — URINALYSIS, ROUTINE W REFLEX MICROSCOPIC
Bilirubin Urine: NEGATIVE
Glucose, UA: NEGATIVE mg/dL
Hgb urine dipstick: NEGATIVE
Ketones, ur: NEGATIVE mg/dL
Leukocytes,Ua: NEGATIVE
Nitrite: NEGATIVE
Protein, ur: NEGATIVE mg/dL
Specific Gravity, Urine: 1.03 (ref 1.005–1.030)
pH: 5 (ref 5.0–8.0)

## 2023-12-02 LAB — HCG, QUANTITATIVE, PREGNANCY: hCG, Beta Chain, Quant, S: 21 m[IU]/mL — ABNORMAL HIGH (ref <5)

## 2023-12-02 NOTE — ED Notes (Signed)
 Patient given sprite, ok per EDP.

## 2023-12-02 NOTE — Congregational Nurse Program (Signed)
 Client to RN office for scheduled visit. Client has had many events in her personal life that have been overwhelming for her. She is newly pregnant and or still pregnant with her ectopic pregnancy that was terminated early March but she needed follow up on to confirm termination but was unable to get to [place] office due to it being in [place]. She had an appointment today with [facility] [place] [facility] but states she missed it due to lack of transportation. She has no access to multivitamins and attempted to call OBGYN office but they are out of office at this time and will attempt to call again  [place] also has a few court dates and need assistance with transportation to [facility] to be compliant with court procedures. RN called [employer] with client for help with straightening out court matters. RN able to speak to court with client and also speak to the public defenders office to help her get assistance with getting transportation to court to avoid her going to jail.   RN was able to get [employer] bus passes for client. She will go tomorrow. RN will follow up with client on Thursday.
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
## 2023-12-02 NOTE — Discharge Instructions (Addendum)
 Your evaluation did not show any reason for your weakness other than your elevated thyroid  levels.  This should improve with you continuing taking the medication.  It is very important that you take your methimazole  exactly as prescribed.  You will need to follow-up with your primary care provider to check that your blood levels are going down to the normal range.  Your hCG level is mildly elevated and this is related to your recent ectopic pregnancy.  Please follow-up with the gynecology clinic in 1 week for reevaluation.

## 2023-12-06 ENCOUNTER — Encounter (HOSPITAL_COMMUNITY): Payer: Self-pay | Admitting: Emergency Medicine

## 2023-12-06 ENCOUNTER — Emergency Department (HOSPITAL_COMMUNITY)
Admission: EM | Admit: 2023-12-06 | Discharge: 2023-12-07 | Disposition: A | Discharge status: Home / Self Care | Attending: Emergency Medicine | Admitting: Emergency Medicine

## 2023-12-06 ENCOUNTER — Emergency Department (HOSPITAL_COMMUNITY)

## 2023-12-06 ENCOUNTER — Other Ambulatory Visit: Payer: Self-pay

## 2023-12-06 ENCOUNTER — Ambulatory Visit (HOSPITAL_COMMUNITY): Admission: EM | Admit: 2023-12-06 | Discharge: 2023-12-06 | Disposition: A | Discharge status: Home / Self Care

## 2023-12-06 DIAGNOSIS — S0592XA Unspecified injury of left eye and orbit, initial encounter: Secondary | ICD-10-CM

## 2023-12-06 DIAGNOSIS — H5712 Ocular pain, left eye: Secondary | ICD-10-CM

## 2023-12-06 DIAGNOSIS — S0512XA Contusion of eyeball and orbital tissues, left eye, initial encounter: Secondary | ICD-10-CM | POA: Diagnosis not present

## 2023-12-06 DIAGNOSIS — S0083XA Contusion of other part of head, initial encounter: Secondary | ICD-10-CM | POA: Diagnosis not present

## 2023-12-06 DIAGNOSIS — S022XXA Fracture of nasal bones, initial encounter for closed fracture: Secondary | ICD-10-CM | POA: Insufficient documentation

## 2023-12-06 DIAGNOSIS — T7491XA Unspecified adult maltreatment, confirmed, initial encounter: Secondary | ICD-10-CM

## 2023-12-06 DIAGNOSIS — S0990XA Unspecified injury of head, initial encounter: Secondary | ICD-10-CM

## 2023-12-06 DIAGNOSIS — S0590XA Unspecified injury of unspecified eye and orbit, initial encounter: Secondary | ICD-10-CM | POA: Diagnosis not present

## 2023-12-06 LAB — CBC WITH DIFFERENTIAL/PLATELET
Abs Immature Granulocytes: 0.01 10*3/uL (ref 0.00–0.07)
Basophils Absolute: 0 10*3/uL (ref 0.0–0.1)
Basophils Relative: 0 %
Eosinophils Absolute: 0.1 10*3/uL (ref 0.0–0.5)
Eosinophils Relative: 3 %
HCT: 37.5 % (ref 36.0–46.0)
Hemoglobin: 11.6 g/dL — ABNORMAL LOW (ref 12.0–15.0)
Immature Granulocytes: 0 %
Lymphocytes Relative: 47 %
Lymphs Abs: 2.5 10*3/uL (ref 0.7–4.0)
MCH: 23.4 pg — ABNORMAL LOW (ref 26.0–34.0)
MCHC: 30.9 g/dL (ref 30.0–36.0)
MCV: 75.8 fL — ABNORMAL LOW (ref 80.0–100.0)
Monocytes Absolute: 0.9 10*3/uL (ref 0.1–1.0)
Monocytes Relative: 16 %
Neutro Abs: 1.8 10*3/uL (ref 1.7–7.7)
Neutrophils Relative %: 34 %
Platelets: 148 10*3/uL — ABNORMAL LOW (ref 150–400)
RBC: 4.95 MIL/uL (ref 3.87–5.11)
RDW: 15.8 % — ABNORMAL HIGH (ref 11.5–15.5)
WBC: 5.3 10*3/uL (ref 4.0–10.5)
nRBC: 0 % (ref 0.0–0.2)

## 2023-12-06 LAB — BASIC METABOLIC PANEL WITH GFR
Anion gap: 9 (ref 5–15)
BUN: 9 mg/dL (ref 6–20)
CO2: 21 mmol/L — ABNORMAL LOW (ref 22–32)
Calcium: 8.8 mg/dL — ABNORMAL LOW (ref 8.9–10.3)
Chloride: 106 mmol/L (ref 98–111)
Creatinine, Ser: 0.47 mg/dL (ref 0.44–1.00)
GFR, Estimated: >60 mL/min (ref >60)
Glucose, Bld: 81 mg/dL (ref 70–99)
Potassium: 3.9 mmol/L (ref 3.5–5.1)
Sodium: 136 mmol/L (ref 135–145)

## 2023-12-06 LAB — HCG, QUANTITATIVE, PREGNANCY: hCG, Beta Chain, Quant, S: 19 m[IU]/mL — ABNORMAL HIGH (ref <5)

## 2023-12-06 MED ORDER — METHIMAZOLE 10 MG PO TABS
10.0000 mg | ORAL_TABLET | Freq: Two times a day (BID) | ORAL | Status: DC
  Administered 2023-12-07 (×2): 10 mg via ORAL
  Filled 2023-12-06 (×3): qty 1

## 2023-12-06 MED ORDER — METOPROLOL SUCCINATE ER 25 MG PO TB24
50.0000 mg | ORAL_TABLET | Freq: Every day | ORAL | Status: DC
  Administered 2023-12-07: 50 mg via ORAL
  Filled 2023-12-06: qty 2

## 2023-12-06 MED ORDER — METFORMIN HCL 500 MG PO TABS: 1000.0000 mg | ORAL_TABLET | Freq: Every day | ORAL | Status: DC

## 2023-12-06 MED ORDER — TETRACAINE HCL 0.5 % OP SOLN
2.0000 [drp] | Freq: Once | OPHTHALMIC | Status: AC
  Administered 2023-12-06: 2 [drp] via OPHTHALMIC
  Filled 2023-12-06: qty 4

## 2023-12-06 MED ORDER — FLUORESCEIN SODIUM 1 MG OP STRP
1.0000 | ORAL_STRIP | Freq: Once | OPHTHALMIC | Status: AC
  Administered 2023-12-06: 1 via OPHTHALMIC
  Filled 2023-12-06: qty 1

## 2023-12-06 NOTE — ED Notes (Signed)
 Patient reports awaiting call back for placement. Request snack bag and drink provided as requested

## 2023-12-06 NOTE — ED Notes (Signed)
 Patient reports still waiting for update for placement

## 2023-12-06 NOTE — Discharge Instructions (Signed)
 You have a CAT scan of your head and orbits.  They did not see any obvious internal bleeding but you have a possible nasal fracture.  Please use ice to the affected area around your eye.  Your sensitivity to bright light should improve over a week or so.
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
## 2023-12-06 NOTE — ED Notes (Signed)
 Patient transported to CT

## 2023-12-06 NOTE — ED Notes (Addendum)
 Requested lab to draw blood from pt. RN unable to obtain blood
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
## 2023-12-06 NOTE — ED Notes (Addendum)
 Patient ambulated to room 40.

## 2023-12-06 NOTE — ED Notes (Signed)
 Patient is being discharged from the Urgent Care and sent to the Emergency Department via POV . Per Paola Bohr, [place], patient is in need of higher level of care due to limited resources, potential orbital fracture. Patient is aware and verbalizes understanding of plan of care.  Vitals:   12/06/23 1452  BP: 113/75  Pulse: (!) 106  Resp: 16  Temp: 98 F (36.7 C)  SpO2: 97%
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
## 2023-12-06 NOTE — Discharge Instructions (Signed)
 Mechanism of injury, duration of time and physical exam findings are all concerning for a possible orbital fracture.  The pain has worsened over the last 3 days as well as having pain with movement of the eye along with headaches, dizziness and some increased blurred vision.  At this time we recommend further evaluation at the emergency room where more advanced imaging can be performed.

## 2023-12-06 NOTE — ED Notes (Addendum)
 Pt on the phone with crisis line
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
## 2023-12-07 NOTE — ED Notes (Signed)
 [employer] contacted to arrange transportation.  RN's drop-off instructions and address were given to the [facility] dispatcher. ETA is 15 minutes.

## 2023-12-08 DIAGNOSIS — R590 Localized enlarged lymph nodes: Secondary | ICD-10-CM | POA: Diagnosis not present

## 2023-12-08 DIAGNOSIS — J029 Acute pharyngitis, unspecified: Secondary | ICD-10-CM | POA: Diagnosis not present

## 2023-12-09 ENCOUNTER — Telehealth: Payer: Self-pay
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
## 2023-12-09 NOTE — Transitions of Care (Post Inpatient/ED Visit) (Signed)
   12/09/2023  Name: Alison Cummings MRN: [id number] DOB: 01/04/2001  Today's TOC FU Call Status: Today's TOC FU Call Status:: Unsuccessful Call (1st Attempt) Unsuccessful Call (1st Attempt) Date: 12/09/23  Attempted to reach the patient regarding the most recent Inpatient/ED visit.  Follow Up Plan: Additional outreach attempts will be made to reach the patient to complete the Transitions of Care (Post Inpatient/ED visit) call.   Signature  [employer], [place]

## 2023-12-10 ENCOUNTER — Telehealth: Payer: Self-pay

## 2023-12-10 NOTE — Transitions of Care (Post Inpatient/ED Visit) (Signed)
   12/10/2023  Name: Alison Cummings MRN: [id number] DOB: 07-Aug-2000  Today's TOC FU Call Status: Today's TOC FU Call Status:: Unsuccessful Call (2nd Attempt) Unsuccessful Call (2nd Attempt) Date: 12/10/23  Attempted to reach the patient regarding the most recent Inpatient/ED visit.  Follow Up Plan: Additional outreach attempts will be made to reach the patient to complete the Transitions of Care (Post Inpatient/ED visit) call.   Signature  [employer], [place]

## 2023-12-11 ENCOUNTER — Telehealth: Payer: Self-pay | Admitting: Cardiovascular Disease
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
## 2023-12-11 NOTE — Telephone Encounter (Signed)
 Called because we sent a referral but they don't accept the patient's insurance. Please advise
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
## 2023-12-17 ENCOUNTER — Ambulatory Visit: Attending: General Practice | Admitting: General Practice
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
## 2024-01-04 ENCOUNTER — Inpatient Hospital Stay (HOSPITAL_COMMUNITY)
Admission: AD | Admit: 2024-01-04 | Discharge: 2024-01-08 | DRG: 885 | Disposition: A | Discharge status: Other Acute Inpatient Hospital | Source: Intra-hospital | Attending: Hospitalist | Admitting: Hospitalist

## 2024-01-04 ENCOUNTER — Encounter (HOSPITAL_COMMUNITY): Payer: Self-pay | Admitting: Nurse Practitioner

## 2024-01-04 ENCOUNTER — Encounter (HOSPITAL_COMMUNITY): Payer: Self-pay

## 2024-01-04 ENCOUNTER — Emergency Department (HOSPITAL_COMMUNITY)
Admission: EM | Admit: 2024-01-04 | Discharge: 2024-01-04 | Disposition: A | Discharge status: Other Acute Inpatient Hospital

## 2024-01-04 ENCOUNTER — Other Ambulatory Visit: Payer: Self-pay

## 2024-01-04 DIAGNOSIS — Z833 Family history of diabetes mellitus: Secondary | ICD-10-CM | POA: Diagnosis not present

## 2024-01-04 DIAGNOSIS — Z8759 Personal history of other complications of pregnancy, childbirth and the puerperium: Secondary | ICD-10-CM

## 2024-01-04 DIAGNOSIS — F329 Major depressive disorder, single episode, unspecified: Secondary | ICD-10-CM | POA: Insufficient documentation

## 2024-01-04 DIAGNOSIS — Z87891 Personal history of nicotine dependence: Secondary | ICD-10-CM

## 2024-01-04 DIAGNOSIS — T781XXA Other adverse food reactions, not elsewhere classified, initial encounter: Secondary | ICD-10-CM | POA: Diagnosis not present

## 2024-01-04 DIAGNOSIS — F32A Depression, unspecified: Secondary | ICD-10-CM | POA: Diagnosis not present

## 2024-01-04 DIAGNOSIS — Z5941 Food insecurity: Secondary | ICD-10-CM | POA: Diagnosis not present

## 2024-01-04 DIAGNOSIS — Z823 Family history of stroke: Secondary | ICD-10-CM | POA: Diagnosis not present

## 2024-01-04 DIAGNOSIS — F259 Schizoaffective disorder, unspecified: Secondary | ICD-10-CM | POA: Diagnosis not present

## 2024-01-04 DIAGNOSIS — R45851 Suicidal ideations: Secondary | ICD-10-CM | POA: Diagnosis not present

## 2024-01-04 DIAGNOSIS — R42 Dizziness and giddiness: Secondary | ICD-10-CM | POA: Diagnosis not present

## 2024-01-04 DIAGNOSIS — Z560 Unemployment, unspecified: Secondary | ICD-10-CM | POA: Diagnosis not present

## 2024-01-04 DIAGNOSIS — Z91199 Patient's noncompliance with other medical treatment and regimen due to unspecified reason: Secondary | ICD-10-CM | POA: Diagnosis not present

## 2024-01-04 DIAGNOSIS — W19XXXA Unspecified fall, initial encounter: Secondary | ICD-10-CM | POA: Diagnosis not present

## 2024-01-04 DIAGNOSIS — F332 Major depressive disorder, recurrent severe without psychotic features: Secondary | ICD-10-CM | POA: Diagnosis not present

## 2024-01-04 DIAGNOSIS — I10 Essential (primary) hypertension: Secondary | ICD-10-CM | POA: Diagnosis present

## 2024-01-04 DIAGNOSIS — Z818 Family history of other mental and behavioral disorders: Secondary | ICD-10-CM

## 2024-01-04 DIAGNOSIS — E669 Obesity, unspecified: Secondary | ICD-10-CM | POA: Diagnosis not present

## 2024-01-04 DIAGNOSIS — F419 Anxiety disorder, unspecified: Secondary | ICD-10-CM | POA: Diagnosis not present

## 2024-01-04 DIAGNOSIS — F902 Attention-deficit hyperactivity disorder, combined type: Secondary | ICD-10-CM | POA: Diagnosis present

## 2024-01-04 DIAGNOSIS — Z5900 Homelessness unspecified: Secondary | ICD-10-CM

## 2024-01-04 DIAGNOSIS — E039 Hypothyroidism, unspecified: Secondary | ICD-10-CM | POA: Insufficient documentation

## 2024-01-04 DIAGNOSIS — Z9181 History of falling: Secondary | ICD-10-CM | POA: Diagnosis not present

## 2024-01-04 DIAGNOSIS — F411 Generalized anxiety disorder: Secondary | ICD-10-CM | POA: Diagnosis present

## 2024-01-04 DIAGNOSIS — R079 Chest pain, unspecified: Secondary | ICD-10-CM | POA: Diagnosis not present

## 2024-01-04 DIAGNOSIS — R000 Tachycardia, unspecified: Secondary | ICD-10-CM | POA: Diagnosis not present

## 2024-01-04 DIAGNOSIS — S0990XA Unspecified injury of head, initial encounter: Secondary | ICD-10-CM | POA: Diagnosis not present

## 2024-01-04 DIAGNOSIS — Z886 Allergy status to analgesic agent status: Secondary | ICD-10-CM

## 2024-01-04 DIAGNOSIS — E0500 Thyrotoxicosis with diffuse goiter without thyrotoxic crisis or storm: Secondary | ICD-10-CM | POA: Diagnosis not present

## 2024-01-04 DIAGNOSIS — Z9152 Personal history of nonsuicidal self-harm: Secondary | ICD-10-CM | POA: Diagnosis not present

## 2024-01-04 DIAGNOSIS — F321 Major depressive disorder, single episode, moderate: Secondary | ICD-10-CM

## 2024-01-04 DIAGNOSIS — F4310 Post-traumatic stress disorder, unspecified: Secondary | ICD-10-CM | POA: Insufficient documentation

## 2024-01-04 DIAGNOSIS — Z91018 Allergy to other foods: Secondary | ICD-10-CM

## 2024-01-04 DIAGNOSIS — Z91148 Patient's other noncompliance with medication regimen for other reason: Secondary | ICD-10-CM | POA: Diagnosis not present

## 2024-01-04 DIAGNOSIS — Z888 Allergy status to other drugs, medicaments and biological substances status: Secondary | ICD-10-CM | POA: Diagnosis not present

## 2024-01-04 DIAGNOSIS — Z83438 Family history of other disorder of lipoprotein metabolism and other lipidemia: Secondary | ICD-10-CM | POA: Diagnosis not present

## 2024-01-04 DIAGNOSIS — Z6836 Body mass index (BMI) 36.0-36.9, adult: Secondary | ICD-10-CM

## 2024-01-04 DIAGNOSIS — Z79899 Other long term (current) drug therapy: Secondary | ICD-10-CM | POA: Diagnosis not present

## 2024-01-04 DIAGNOSIS — Z1152 Encounter for screening for COVID-19: Secondary | ICD-10-CM | POA: Diagnosis not present

## 2024-01-04 DIAGNOSIS — Z5901 Sheltered homelessness: Secondary | ICD-10-CM | POA: Diagnosis not present

## 2024-01-04 DIAGNOSIS — R569 Unspecified convulsions: Secondary | ICD-10-CM | POA: Diagnosis not present

## 2024-01-04 DIAGNOSIS — E0590 Thyrotoxicosis, unspecified without thyrotoxic crisis or storm: Secondary | ICD-10-CM | POA: Diagnosis not present

## 2024-01-04 DIAGNOSIS — Z62810 Personal history of physical and sexual abuse in childhood: Secondary | ICD-10-CM

## 2024-01-04 DIAGNOSIS — F319 Bipolar disorder, unspecified: Secondary | ICD-10-CM | POA: Diagnosis not present

## 2024-01-04 DIAGNOSIS — R55 Syncope and collapse: Secondary | ICD-10-CM | POA: Diagnosis not present

## 2024-01-04 LAB — COMPREHENSIVE METABOLIC PANEL WITH GFR
ALT: 37 U/L (ref 0–44)
AST: 32 U/L (ref 15–41)
Albumin: 3.6 g/dL (ref 3.5–5.0)
Alkaline Phosphatase: 113 U/L (ref 38–126)
Anion gap: 10 (ref 5–15)
BUN: 16 mg/dL (ref 6–20)
CO2: 23 mmol/L (ref 22–32)
Calcium: 9.2 mg/dL (ref 8.9–10.3)
Chloride: 105 mmol/L (ref 98–111)
Creatinine, Ser: 0.61 mg/dL (ref 0.44–1.00)
GFR, Estimated: >60 mL/min (ref >60)
Glucose, Bld: 91 mg/dL (ref 70–99)
Potassium: 3.7 mmol/L (ref 3.5–5.1)
Sodium: 138 mmol/L (ref 135–145)
Total Bilirubin: 0.5 mg/dL (ref 0.0–1.2)
Total Protein: 7.7 g/dL (ref 6.5–8.1)

## 2024-01-04 LAB — CBC
HCT: 38.3 % (ref 36.0–46.0)
Hemoglobin: 12.2 g/dL (ref 12.0–15.0)
MCH: 23.7 pg — ABNORMAL LOW (ref 26.0–34.0)
MCHC: 31.9 g/dL (ref 30.0–36.0)
MCV: 74.4 fL — ABNORMAL LOW (ref 80.0–100.0)
Platelets: 138 10*3/uL — ABNORMAL LOW (ref 150–400)
RBC: 5.15 MIL/uL — ABNORMAL HIGH (ref 3.87–5.11)
RDW: 16.1 % — ABNORMAL HIGH (ref 11.5–15.5)
WBC: 5.3 10*3/uL (ref 4.0–10.5)
nRBC: 0 % (ref 0.0–0.2)

## 2024-01-04 LAB — TSH: TSH: <0.01 u[IU]/mL — ABNORMAL LOW (ref 0.350–4.500)

## 2024-01-04 LAB — RESP PANEL BY RT-PCR (RSV, FLU A&B, COVID)  RVPGX2
Influenza A by PCR: NEGATIVE
Influenza B by PCR: NEGATIVE
Resp Syncytial Virus by PCR: NEGATIVE
SARS Coronavirus 2 by RT PCR: NEGATIVE

## 2024-01-04 LAB — RAPID URINE DRUG SCREEN, HOSP PERFORMED
Amphetamines: NOT DETECTED
Barbiturates: NOT DETECTED
Benzodiazepines: NOT DETECTED
Cocaine: NOT DETECTED
Opiates: NOT DETECTED
Tetrahydrocannabinol: NOT DETECTED

## 2024-01-04 LAB — HCG, SERUM, QUALITATIVE: Preg, Serum: POSITIVE — AB

## 2024-01-04 LAB — T4, FREE: Free T4: 5.26 ng/dL — ABNORMAL HIGH (ref 0.61–1.12)

## 2024-01-04 LAB — HCG, QUANTITATIVE, PREGNANCY: hCG, Beta Chain, Quant, S: 10 m[IU]/mL — ABNORMAL HIGH (ref <5)

## 2024-01-04 LAB — ETHANOL: Alcohol, Ethyl (B): <15 mg/dL (ref <15)

## 2024-01-04 MED ORDER — OLANZAPINE 5 MG PO TBDP
5.0000 mg | ORAL_TABLET | Freq: Three times a day (TID) | ORAL | Status: DC | PRN
  Administered 2024-01-05: 5 mg via ORAL
  Filled 2024-01-04: qty 1

## 2024-01-04 MED ORDER — ALUM & MAG HYDROXIDE-SIMETH 200-200-20 MG/5ML PO SUSP: 30.0000 mL | ORAL | Status: DC | PRN

## 2024-01-04 MED ORDER — MAGNESIUM HYDROXIDE 400 MG/5ML PO SUSP: 30.0000 mL | Freq: Every day | ORAL | Status: DC | PRN

## 2024-01-04 MED ORDER — OLANZAPINE 10 MG IM SOLR: 10.0000 mg | Freq: Three times a day (TID) | INTRAMUSCULAR | Status: DC | PRN

## 2024-01-04 MED ORDER — OLANZAPINE 10 MG IM SOLR: 5.0000 mg | Freq: Three times a day (TID) | INTRAMUSCULAR | Status: DC | PRN

## 2024-01-04 MED ORDER — ACETAMINOPHEN 325 MG PO TABS: 650.0000 mg | ORAL_TABLET | Freq: Four times a day (QID) | ORAL | Status: DC | PRN

## 2024-01-04 NOTE — ED Provider Notes (Signed)
 [place] EMERGENCY DEPARTMENT AT [facility] Provider Note   CSN: [id number] Arrival date & time: 01/04/24  0448     History  Chief Complaint  Patient presents with   Suicidal    Alison Cummings is a 23 y.o. female.  Patient complains of depression and having suicidal thoughts.  Patient reports that she has had some hallucinations as well.  Patient reports that she currently is seeing a Veterinary surgeon.  Patient has a past medical history of hypothyroidism and high blood pressure  Patient reports she has had a cough and some nasal congestion as well.  She is not having any shortness of breath she denies any fever or chills.  Patient had a ectopic pregnancy last month.  She received shots of methotrexate.  The history is provided by the patient. No language interpreter was used.       Home Medications Prior to Admission medications   Medication Sig Start Date End Date Taking? Authorizing Provider  escitalopram  (LEXAPRO ) 10 MG tablet Take 1 tablet (10 mg total) by mouth daily for 14 days. 08/30/23 05/04/24  Bobbitt, Shalon E, NP  hydrOXYzine  (ATARAX ) 25 MG tablet Take 1 tablet (25 mg total) by mouth every 6 (six) hours as needed for itching or anxiety. 11/26/23   Prosperi, Christian H, PA-C  methimazole  (TAPAZOLE ) 10 MG tablet Take 1 tablet (10 mg total) by mouth 2 (two) times daily. 11/26/23   Prosperi, Christian H, PA-C  metoprolol  succinate (TOPROL -XL) 50 MG 24 hr tablet Take 1 tablet (50 mg total) by mouth daily. 11/26/23   Prosperi, Christian H, PA-C  risperiDONE  (RISPERDAL ) 0.25 MG tablet Take 0.25 mg by mouth in the morning and at bedtime. 11/09/23   [provider]      Allergies    Apple, Cherry, Diphenhydramine, Mangifera indica, and Ibuprofen     Review of Systems   Review of Systems  All other systems reviewed and are negative.   Physical Exam Updated Vital Signs BP 113/80 (BP Location: Right Arm)   Pulse (!) 121   Temp 98.5 F (36.9 C) (Oral)   Resp  (!) 21   Ht 5\' 2"  (1.575 m)   Wt 90 kg   LMP 12/19/2023 (Approximate)   SpO2 97%   BMI 36.29 kg/m  Physical Exam Vitals and nursing note reviewed.  Constitutional:      Appearance: She is well-developed.  HENT:     Head: Normocephalic.     Mouth/Throat:     Mouth: Mucous membranes are moist.  Eyes:     Pupils: Pupils are equal, round, and reactive to light.  Cardiovascular:     Rate and Rhythm: Normal rate.  Pulmonary:     Effort: Pulmonary effort is normal.  Abdominal:     General: There is no distension.  Musculoskeletal:        General: Normal range of motion.     Cervical back: Normal range of motion.  Skin:    General: Skin is warm.  Neurological:     General: No focal deficit present.     Mental Status: She is alert and oriented to person, place, and time.  Psychiatric:        Mood and Affect: Mood normal.     ED Results / Procedures / Treatments   Labs (all labs ordered are listed, but only abnormal results are displayed) Labs Reviewed  CBC - Abnormal; Notable for the following components:      Result Value   RBC 5.15 (*)  MCV 74.4 (*)    MCH 23.7 (*)    RDW 16.1 (*)    Platelets 138 (*)    All other components within normal limits  HCG, SERUM, QUALITATIVE - Abnormal; Notable for the following components:   Preg, Serum POSITIVE (*)    All other components within normal limits  HCG, QUANTITATIVE, PREGNANCY - Abnormal; Notable for the following components:   hCG, Beta Chain, Quant, S 10 (*)    All other components within normal limits  RESP PANEL BY RT-PCR (RSV, FLU A&B, COVID)  [id number]  COMPREHENSIVE METABOLIC PANEL WITH GFR  ETHANOL  RAPID URINE DRUG SCREEN, HOSP PERFORMED    EKG None  Radiology No results found.  Procedures Procedures    Medications Ordered in ED Medications - No data to display  ED Course/ Medical Decision Making/ A&P                                 Medical Decision Making Patient complains of thoughts of harming  herself depression and nasal congestion  Amount and/or Complexity of Data Reviewed Labs: ordered. Decision-making details documented in ED Course.    Details: Labs ordered reviewed and interpreted patient's quantitative is 10.  Risk Risk Details: Patient is medically clear for TTS consult.  Patient has decreased hCG.  Patient advised to  follow-up with gynecology.           Final Clinical Impression(s) / ED Diagnoses Final diagnoses:  Suicidal ideation  Depression, unspecified depression type    Rx / DC Orders ED Discharge Orders     None         Sandi Crosby, PA-C 01/04/24 0941    Carin Charleston, MD 01/04/24 [phone]

## 2024-01-04 NOTE — [facility] Group Notes (Signed)
 [facility] Group Notes:  (Nursing/MHT/Case Management/Adjunct)  Date:  01/04/2024  Time:  2000  Type of Therapy:  Wrap up group  Participation Level:  Active  Participation Quality:  Appropriate, Attentive, Sharing, and Supportive  Affect:  Anxious  Cognitive:  Alert  Insight:  Improving  Engagement in Group:  Developing/Improving  Modes of Intervention:  Clarification, Education, and Support  Summary of Progress/Problems: Positive thinking and self-care were discussed.   Catharine Clock 01/04/2024, 9:20 PM

## 2024-01-04 NOTE — ED Triage Notes (Signed)
 Pt states she has felt like she wants to hurt herself for the past 3 days. Pt denies having a plan. Pt denies homicidal ideations. Pt endorses auditory hallucinations, denies visual hallucinations. Pt states she feels a crawling sensation on her skin.

## 2024-01-04 NOTE — Progress Notes (Signed)
   01/04/24 2302  Psych Admission Type (Psych Patients Only)  Admission Status Voluntary  Psychosocial Assessment  Patient Complaints Anxiety  Eye Contact Fair  Facial Expression Animated  Affect Appropriate to circumstance  Speech Logical/coherent  Interaction Assertive  Motor Activity Other (Comment) (WDL)  Behavior Characteristics Appropriate to situation  Mood Pleasant;Depressed  Thought Process  Coherency WDL  Content WDL  Delusions None reported or observed  Perception WDL  Hallucination None reported or observed  Judgment Limited  Confusion None  Danger to Self  Current suicidal ideation? Denies  Agreement Not to Harm Self Yes  Description of Agreement verbal  Danger to Others  Danger to Others None reported or observed

## 2024-01-04 NOTE — ED Notes (Signed)
Safe Transport called 

## 2024-01-04 NOTE — ED Notes (Signed)
 This RN Print production planner for an ETA, informs this RN that pt is on the list, but no ETA

## 2024-01-04 NOTE — Progress Notes (Signed)
 [facility] LCSW Progress Note   01/04/2024    1:27 PM  Alison Cummings   [id number]   Type of Contact and Topic:  Psychiatric Bed Placement   Pt accepted to [facility] 304-2     Patient meets inpatient criteria per [name], NP    The attending provider will be Dr. Linnie Riches   Call report to [phone]    Damon Dumas, RN @ [facility] ED notified.     Pt scheduled  to arrive at [facility] TODAY.    Jolisa Intriago, MSW, LCSW-A  1:28 PM 01/04/2024
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
## 2024-01-04 NOTE — Consult Note (Signed)
 [facility] Psychiatric Consult Initial  Patient Name: .Alison Cummings  MRN: [id number]  DOB: 2001-05-05  Consult Order details:  Orders (From admission, onward)     Start     Ordered   01/04/24 0937  CONSULT TO CALL ACT TEAM       Ordering Provider: Sandi Crosby, PA-C  Provider:  (Not yet assigned)  Question:  Reason for Consult?  Answer:  suicidal   01/04/24 0937             Mode of Visit: In person    Psychiatry Consult Evaluation  Service Date: January 04, 2024 LOS:  LOS: 0 days  Chief Complaint depression/SI  Primary Psychiatric Diagnoses  MDD 2.   3.    Assessment  Alison Cummings is a 23 y.o. female admitted: Presented to the EDfor 01/04/2024  4:56 AM for suicidal ideations. She carries the psychiatric diagnoses of mood disorder and has a past medical history of  ectopic pregnancy.   Her current presentation of SI is most consistent with MDD. She meets criteria for IP based on information below.  Current outpatient psychotropic medications include lexapro , risperidone  and historically she has had a positive response to these medications. She was not compliant with medications prior to admission as evidenced by patient report. On initial examination, patient is sedated, continues to endorse SI. Please see plan below for detailed recommendations.   Diagnoses:  Active Hospital problems: Principal Problem:   MDD (major depressive disorder)    Plan   ## Psychiatric Medication Recommendations:  - Continue Lexapro  10 mg daily - Increase Risperidone  to 1 mg Qhs  ## Medical Decision Making Capacity: Not specifically addressed in this encounter   ## Disposition:-- We recommend inpatient psychiatric hospitalization when medically cleared. Patient is under voluntary admission status at this time; please IVC if attempts to leave hospital.  ## Behavioral / Environmental: - No specific recommendations at this time.     ## Safety and Observation Level:  - Based on  my clinical evaluation, I estimate the patient to be at low risk of self harm in the current setting. - At this time, we recommend  routine. This decision is based on my review of the chart including patient's history and current presentation, interview of the patient, mental status examination, and consideration of suicide risk including evaluating suicidal ideation, plan, intent, suicidal or self-harm behaviors, risk factors, and protective factors. This judgment is based on our ability to directly address suicide risk, implement suicide prevention strategies, and develop a safety plan while the patient is in the clinical setting. Please contact our team if there is a concern that risk level has changed.  CSSR Risk Category:C-SSRS RISK CATEGORY: High Risk  Suicide Risk Assessment: Patient has following modifiable risk factors for suicide: active suicidal ideation and under treated depression , which we are addressing by inpatient admission. Patient has following non-modifiable or demographic risk factors for suicide: psychiatric hospitalization Patient has the following protective factors against suicide: Access to outpatient mental health care and Cultural, spiritual, or religious beliefs that discourage suicide  Thank you for this consult request. Recommendations have been communicated to the primary team.  We will recommend IP at this time.   [name], NP       History of Present Illness  Relevant Aspects of Hospital ED Course:  Patient complains of depression and having suicidal thoughts. Patient reports that she has had some hallucinations as well. Patient reports that she currently is seeing  a Veterinary surgeon. Patient has a past medical history of hypothyroidism and high blood pressure Patient reports she has had a cough and some nasal congestion as well. She is not having any shortness of breath she denies any fever or chills. Patient had a ectopic pregnancy last month. She received shots  of methotrexate.   Patient Report:  Pt seen at [facility] for face to face psychiatric evaluation. She is lethargic, engages poorly in examination. She continues to endorse SI. Denies current specific plan or intent. Denies HI. Denies current AVH. She does not give much information on her current social situation such as support, employment, or living situation.   Pt last presented in mental health crisis simultaneously in thyrotoxicosis. No labs ordered upon this admission for thyroid  evaluation. Will order TSH and T4, free to verify she is not currently in thyrotoxicosis, as she presented similarly with hallucinations in April 2025 and had to be medically admitted. ED team updated.   If patient is medically cleared and does not need further treatment upon lab results, will recommend inpatient admission.   Review of Systems  Psychiatric/Behavioral:  Positive for depression and suicidal ideas.      Psychiatric and Social History  Psychiatric History:  Information collected from chart, patient  Prev Dx/Sx: MDD Current Psych Provider: denies Home Meds (current): lexapro , risperidone  Previous Med Trials: denies Therapy: denies  Prior Psych Hospitalization: yes  Prior Self Harm: denies Prior Violence: denies  Social History:  Educational Hx: na Occupational Hx: na Legal Hx: na Living Situation: na Spiritual Hx: na Access to weapons/lethal means: na   Substance History Alcohol: na  Illicit drugs: denies, UDS negative   Exam Findings  Physical Exam:  Vital Signs:  Temp:  [98.5 F (36.9 C)-98.9 F (37.2 C)] 98.5 F (36.9 C) (06/01 0803) Pulse Rate:  [112-121] 121 (06/01 0803) Resp:  [18-21] 21 (06/01 0803) BP: (113-138)/(80) 113/80 (06/01 0803) SpO2:  [97 %-100 %] 97 % (06/01 0803) Weight:  [90 kg] 90 kg (06/01 0455) Blood pressure 113/80, pulse (!) 121, temperature 98.5 F (36.9 C), temperature source Oral, resp. rate (!) 21, height 5\' 2"  (1.575 m), weight 90 kg, last  menstrual period 12/19/2023, SpO2 97%, unknown if currently breastfeeding. Body mass index is 36.29 kg/m.  Physical Exam Vitals and nursing note reviewed.  Neurological:     Mental Status: She is alert and oriented to person, place, and time.     Mental Status Exam: General Appearance: Fairly Groomed  Orientation:  Full (Time, Place, and Person)  Memory:  Immediate;   Fair Recent;   Fair  Concentration:  Concentration: Fair  Recall:  Fair  Attention  Fair  Eye Contact:  Poor  Speech:  Clear and Coherent  Language:  Good  Volume:  Decreased  Mood: "depressed"  Affect:  Congruent and Flat  Thought Process:  NA  Thought Content:  WDL  Suicidal Thoughts:  Yes.  without intent/plan  Homicidal Thoughts:  No  Judgement:  Fair  Insight:  Fair  Psychomotor Activity:  Normal  Akathisia:  No  Fund of Knowledge:  Fair      Assets:  Corporate investment banker Health Resilience  Cognition:  WNL  ADL's:  Intact  AIMS (if indicated):        Other History   These have been pulled in through the EMR, reviewed, and updated if appropriate.  Family History:  The patient's family history includes ADD / ADHD in her father; Bipolar disorder in her father; Diabetes in  her sister; High blood pressure in her father and mother; Hyperlipidemia in her mother and sister; Schizophrenia in her mother; Stroke in her father.  Medical History: Past Medical History:  Diagnosis Date  . ADHD (attention deficit hyperactivity disorder)   . ADHD (attention deficit hyperactivity disorder), combined type 08/31/2013  . 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 Anxiety   . Depression   . Episodic mood disorder (HCC) 08/31/2013   IMO SNOMED Dx Update Oct 2024    . High blood pressure 06/11/2023  . Hypertension   . Hyperthyroidism 06/11/2023  . Obesity   . Seasonal allergies   . Seizures [facility])     Surgical History: Past Surgical History:  Procedure  Laterality Date  . ADENOIDECTOMY       Medications:  No current facility-administered medications for this encounter.  Current Outpatient Medications:  .  escitalopram  (LEXAPRO ) 10 MG tablet, Take 1 tablet (10 mg total) by mouth daily for 14 days., Disp: 14 tablet, Rfl: 0 .  hydrOXYzine  (ATARAX ) 25 MG tablet, Take 1 tablet (25 mg total) by mouth every 6 (six) hours as needed for itching or anxiety., Disp: 12 tablet, Rfl: 0 .  methimazole  (TAPAZOLE ) 10 MG tablet, Take 1 tablet (10 mg total) by mouth 2 (two) times daily., Disp: 60 tablet, Rfl: 1 .  metoprolol  succinate (TOPROL -XL) 50 MG 24 hr tablet, Take 1 tablet (50 mg total) by mouth daily., Disp: 30 tablet, Rfl: 1 .  risperiDONE  (RISPERDAL ) 0.25 MG tablet, Take 0.25 mg by mouth in the morning and at bedtime., Disp: , Rfl:   Allergies: Allergies  Allergen Reactions  . Apple Itching, Swelling and Other (See Comments)    Tongue swells and throat itches  . Cherry Itching, Swelling and Other (See Comments)    Tongue swells and throat itches  . Diphenhydramine Other (See Comments)  . Mangifera Indica   . Ibuprofen  Palpitations and Swelling    Of feet    [name], NP

## 2024-01-04 NOTE — Tx Team (Signed)
 Initial Treatment Plan 01/04/2024 11:22 PM Alison Cummings:[id number]    PATIENT STRESSORS: Financial difficulties   Health problems   Marital or family conflict     PATIENT STRENGTHS: Average or above average intelligence  Communication skills  Physical Health    PATIENT IDENTIFIED PROBLEMS: Depression  Suicidal Ideation        "Develop better ways to cope"           DISCHARGE CRITERIA:  Improved stabilization in mood, thinking, and/or behavior Motivation to continue treatment in a less acute level of care Need for constant or close observation no longer present Verbal commitment to aftercare and medication compliance  PRELIMINARY DISCHARGE PLAN: Outpatient therapy Placement in alternative living arrangements  PATIENT/FAMILY INVOLVEMENT: This treatment plan has been presented to and reviewed with the patient, [name],.  The patient and family have been given the opportunity to ask questions and make suggestions.  Delaney Fearing, RN 01/04/2024, 11:22 PM

## 2024-01-04 NOTE — ED Notes (Addendum)
 Safe transport to ED to transport pt to [facility] , 1 property bag accompanies pt

## 2024-01-05 ENCOUNTER — Encounter (HOSPITAL_COMMUNITY): Payer: Self-pay

## 2024-01-05 DIAGNOSIS — F4310 Post-traumatic stress disorder, unspecified: Secondary | ICD-10-CM | POA: Insufficient documentation

## 2024-01-05 MED ORDER — ARIPIPRAZOLE 5 MG PO TABS
5.0000 mg | ORAL_TABLET | Freq: Every day | ORAL | Status: DC
  Administered 2024-01-06 – 2024-01-07 (×2): 5 mg via ORAL
  Filled 2024-01-05 (×2): qty 1

## 2024-01-05 MED ORDER — METHIMAZOLE 10 MG PO TABS
10.0000 mg | ORAL_TABLET | Freq: Two times a day (BID) | ORAL | Status: DC
  Administered 2024-01-05 – 2024-01-08 (×5): 10 mg via ORAL
  Filled 2024-01-05 (×7): qty 1

## 2024-01-05 MED ORDER — ARIPIPRAZOLE 2 MG PO TABS
2.0000 mg | ORAL_TABLET | Freq: Once | ORAL | Status: AC
  Administered 2024-01-05: 2 mg via ORAL
  Filled 2024-01-05: qty 1

## 2024-01-05 MED ORDER — METOPROLOL SUCCINATE ER 25 MG PO TB24
50.0000 mg | ORAL_TABLET | Freq: Every day | ORAL | Status: DC
  Administered 2024-01-05 – 2024-01-08 (×3): 50 mg via ORAL
  Filled 2024-01-05 (×3): qty 1
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
## 2024-01-05 NOTE — Plan of Care (Signed)
   Problem: Education: Goal: Knowledge of [place] [facility] Education information/materials will improve Outcome: Progressing

## 2024-01-05 NOTE — Group Note (Signed)
 Therapy Group Note  Group Topic:Other  Group Date: 01/05/2024 Start Time: 1430 End Time: 1503 Facilitators: Lynnda Sas, OT    The primary objective of this topic is to explore and understand the concept of occupational balance in the context of daily living. The term "occupational balance" is defined broadly, encompassing all activities that occupy an individual's time and energy, including self-care, leisure, and work-related tasks. The goal is to guide participants towards achieving a harmonious blend of these activities, tailored to their personal values and life circumstances. This balance is aimed at enhancing overall well-being, not by equally distributing time across activities, but by ensuring that daily engagements are fulfilling and not draining. The content delves into identifying various barriers that individuals face in achieving occupational balance, such as overcommitment, misaligned priorities, external pressures, and lack of effective time management. The impact of these barriers on occupational performance, roles, and lifestyles is examined, highlighting issues like reduced efficiency, strained relationships, and potential health problems. Strategies for cultivating occupational balance are a key focus. These strategies include practical methods like time blocking, prioritizing tasks, establishing self-care rituals, decluttering, connecting with nature, and engaging in reflective practices. These approaches are designed to be adaptable and applicable to a wide range of life scenarios, promoting a proactive and mindful approach to daily living. The overall aim is to equip participants with the knowledge and tools to create a balanced lifestyle that supports their mental, emotional, and physical health, thereby improving their functional performance in daily life.     Participation Level: Engaged   Participation Quality: Independent   Behavior: Appropriate   Speech/Thought  Process: Relevant   Affect/Mood: Appropriate   Insight: Fair   Judgement: Fair      Modes of Intervention: Education  Patient Response to Interventions:  Attentive   Plan: Continue to engage patient in OT groups 2 - 3x/week.  01/05/2024  Lynnda Sas, OT  Alison Cummings, OT

## 2024-01-05 NOTE — Group Note (Signed)
 Date:  01/05/2024 Time:  8:33 AM  Group Topic/Focus:  Goals Group:   The focus of this group is to help patients establish daily goals to achieve during treatment and discuss how the patient can incorporate goal setting into their daily lives to aide in recovery.    Participation Level:  Did Not Attend   Alison Cummings 01/05/2024, 8:33 AM

## 2024-01-05 NOTE — BH IP Treatment Plan (Deleted)
 Interdisciplinary Treatment and Diagnostic Plan Update  01/05/2024 Time of Session: 10:35AM AYA GEISEL MRN: [id number]  Principal Diagnosis: MDD (major depressive disorder)  Secondary Diagnoses: Principal Problem:   MDD (major depressive disorder)   Current Medications:  Current Facility-Administered Medications  Medication Dose Route Frequency Provider Last Rate Last Admin   acetaminophen  (TYLENOL ) tablet 650 mg  650 mg Oral Q6H PRN [name], NP       alum & mag hydroxide-simeth (MAALOX/MYLANTA) 200-200-20 MG/5ML suspension 30 mL  30 mL Oral Q4H PRN [name], NP       magnesium  hydroxide (MILK OF MAGNESIA) suspension 30 mL  30 mL Oral Daily PRN [name], NP       OLANZapine (ZYPREXA) injection 10 mg  10 mg Intramuscular TID PRN [name], NP       OLANZapine (ZYPREXA) injection 5 mg  5 mg Intramuscular TID PRN [name], NP       OLANZapine zydis (ZYPREXA) disintegrating tablet 5 mg  5 mg Oral TID PRN [name], NP       PTA Medications: Medications Prior to Admission  Medication Sig Dispense Refill Last Dose/Taking   escitalopram  (LEXAPRO ) 10 MG tablet Take 1 tablet (10 mg total) by mouth daily for 14 days. 14 tablet 0    hydrOXYzine  (ATARAX ) 25 MG tablet Take 1 tablet (25 mg total) by mouth every 6 (six) hours as needed for itching or anxiety. 12 tablet 0    methimazole  (TAPAZOLE ) 10 MG tablet Take 1 tablet (10 mg total) by mouth 2 (two) times daily. 60 tablet 1    metoprolol  succinate (TOPROL -XL) 50 MG 24 hr tablet Take 1 tablet (50 mg total) by mouth daily. 30 tablet 1    risperiDONE  (RISPERDAL ) 0.25 MG tablet Take 0.25 mg by mouth in the morning and at bedtime.       Patient Stressors: Financial difficulties   Health problems   Marital or family conflict    Patient Strengths: Average or above average intelligence  Communication skills  Physical Health   Treatment Modalities: Medication Management, Group therapy, Case  management,  1 to 1 session with clinician, Psychoeducation, Recreational therapy.   Physician Treatment Plan for Primary Diagnosis: MDD (major depressive disorder) Long Term Goal(s):     Short Term Goals:    Medication Management: Evaluate patient's response, side effects, and tolerance of medication regimen.  Therapeutic Interventions: 1 to 1 sessions, Unit Group sessions and Medication administration.  Evaluation of Outcomes: Not Progressing  Physician Treatment Plan for Secondary Diagnosis: Principal Problem:   MDD (major depressive disorder)  Long Term Goal(s):     Short Term Goals:       Medication Management: Evaluate patient's response, side effects, and tolerance of medication regimen.  Therapeutic Interventions: 1 to 1 sessions, Unit Group sessions and Medication administration.  Evaluation of Outcomes: Not Progressing   RN Treatment Plan for Primary Diagnosis: MDD (major depressive disorder) Long Term Goal(s): Knowledge of disease and therapeutic regimen to maintain health will improve  Short Term Goals: Ability to remain free from injury will improve, Ability to verbalize frustration and anger appropriately will improve, Ability to demonstrate self-control, Ability to participate in decision making will improve, Ability to verbalize feelings will improve, Ability to disclose and discuss suicidal ideas, Ability to identify and develop effective coping behaviors will improve, and Compliance with prescribed medications will improve  Medication Management: RN will administer medications as ordered by provider, will assess and evaluate patient's response and provide education to  patient for prescribed medication. RN will report any adverse and/or side effects to prescribing provider.  Therapeutic Interventions: 1 on 1 counseling sessions, Psychoeducation, Medication administration, Evaluate responses to treatment, Monitor vital signs and CBGs as ordered, Perform/monitor  CIWA, COWS, AIMS and Fall Risk screenings as ordered, Perform wound care treatments as ordered.  Evaluation of Outcomes: Not Progressing   LCSW Treatment Plan for Primary Diagnosis: MDD (major depressive disorder) Long Term Goal(s): Safe transition to appropriate next level of care at discharge, Engage patient in therapeutic group addressing interpersonal concerns.  Short Term Goals: Engage patient in aftercare planning with referrals and resources, Increase social support, Increase ability to appropriately verbalize feelings, Increase emotional regulation, Facilitate acceptance of mental health diagnosis and concerns, Facilitate patient progression through stages of change regarding substance use diagnoses and concerns, Identify triggers associated with mental health/substance abuse issues, and Increase skills for wellness and recovery  Therapeutic Interventions: Assess for all discharge needs, 1 to 1 time with Social worker, Explore available resources and support systems, Assess for adequacy in community support network, Educate family and significant other(s) on suicide prevention, Complete Psychosocial Assessment, Interpersonal group therapy.  Evaluation of Outcomes: Not Progressing   Progress in Treatment: Attending groups: No. Participating in groups: Yes. Taking medication as prescribed: Yes. Toleration medication: Yes. Family/Significant other contact made: {YES/NO/CONTACT:22665} Patient understands diagnosis: {[facility] QMVHQ:46962} Discussing patient identified problems/goals with staff: {[facility] XBMWU:13244} Medical problems stabilized or resolved: {[facility] ADULT:22608} Denies suicidal/homicidal ideation: {[facility] ADULT:22608} Issues/concerns per patient self-inventory: {[facility] ADULT:22608} Other: ***  New problem(s) identified: {[facility] NEW PROBLEMS:22609}  New Short Term/Long Term Goal(s):  Patient Goals:    Discharge Plan or Barriers:   Reason for Continuation of Hospitalization: {[facility]  Reasons for continued hospitalization:22604}  Estimated Length of Stay:  Last 3 [place] Suicide Severity Risk Score: Flowsheet Row [facility] Most recent reading at 01/04/2024 10:48 PM ED from 01/04/2024 in [facility] Department at [facility] Most recent reading at 01/04/2024  4:58 AM ED from 12/06/2023 in [facility] Department at [facility] Most recent reading at 12/06/2023  4:21 PM  C-SSRS RISK CATEGORY High Risk High Risk No Risk       Last PHQ 2/9 Scores:    06/11/2023   10:37 AM  Depression screen PHQ 2/9  Decreased Interest 0  Down, Depressed, Hopeless 0  PHQ - 2 Score 0  Altered sleeping 1  Tired, decreased energy 1  Change in appetite 0  Feeling bad or failure about yourself  0  Trouble concentrating 3  Moving slowly or fidgety/restless 3  Suicidal thoughts 0  PHQ-9 Score 8  Difficult doing work/chores Not difficult at all    Scribe for Treatment Team: Megham Dwyer M Lloyd Cullinan, LCSW 01/05/2024 1:16 PM

## 2024-01-05 NOTE — [facility] Group Notes (Signed)
 [facility] Group Notes:  (Nursing/MHT/Case Management/Adjunct)  Date:  01/05/2024  Time:  10:15 PM  Type of Therapy:  Psychoeducational Skills  Participation Level:  Minimal  Participation Quality:  Attentive  Affect:  Appropriate  Cognitive:  Lacking  Insight:  Lacking  Engagement in Group:  Limited  Modes of Intervention:  Education  Summary of Progress/Problems: The patient attended the evening A.A. speaker's meeting and was appropriate.   Evolet Salminen S 01/05/2024, 10:15 PM

## 2024-01-05 NOTE — BH IP Treatment Plan (Signed)
 Interdisciplinary Treatment and Diagnostic Plan Update  01/05/2024 Time of Session: 10:35AM Alison Cummings MRN: [id number]  Principal Diagnosis: MDD (major depressive disorder)  Secondary Diagnoses: Principal Problem:   MDD (major depressive disorder)   Current Medications:  Current Facility-Administered Medications  Medication Dose Route Frequency Provider Last Rate Last Admin   acetaminophen  (TYLENOL ) tablet 650 mg  650 mg Oral Q6H PRN [name], NP       alum & mag hydroxide-simeth (MAALOX/MYLANTA) 200-200-20 MG/5ML suspension 30 mL  30 mL Oral Q4H PRN [name], NP       magnesium  hydroxide (MILK OF MAGNESIA) suspension 30 mL  30 mL Oral Daily PRN [name], NP       OLANZapine (ZYPREXA) injection 10 mg  10 mg Intramuscular TID PRN [name], NP       OLANZapine (ZYPREXA) injection 5 mg  5 mg Intramuscular TID PRN [name], NP       OLANZapine zydis (ZYPREXA) disintegrating tablet 5 mg  5 mg Oral TID PRN [name], NP       PTA Medications: Medications Prior to Admission  Medication Sig Dispense Refill Last Dose/Taking   escitalopram  (LEXAPRO ) 10 MG tablet Take 1 tablet (10 mg total) by mouth daily for 14 days. 14 tablet 0    hydrOXYzine  (ATARAX ) 25 MG tablet Take 1 tablet (25 mg total) by mouth every 6 (six) hours as needed for itching or anxiety. 12 tablet 0    methimazole  (TAPAZOLE ) 10 MG tablet Take 1 tablet (10 mg total) by mouth 2 (two) times daily. 60 tablet 1    metoprolol  succinate (TOPROL -XL) 50 MG 24 hr tablet Take 1 tablet (50 mg total) by mouth daily. 30 tablet 1    risperiDONE  (RISPERDAL ) 0.25 MG tablet Take 0.25 mg by mouth in the morning and at bedtime.       Patient Stressors: Financial difficulties   Health problems   Marital or family conflict    Patient Strengths: Average or above average intelligence  Communication skills  Physical Health   Treatment Modalities: Medication Management, Group therapy, Case  management,  1 to 1 session with clinician, Psychoeducation, Recreational therapy.   Physician Treatment Plan for Primary Diagnosis: MDD (major depressive disorder) Long Term Goal(s):     Short Term Goals:    Medication Management: Evaluate patient's response, side effects, and tolerance of medication regimen.  Therapeutic Interventions: 1 to 1 sessions, Unit Group sessions and Medication administration.  Evaluation of Outcomes: Not Progressing  Physician Treatment Plan for Secondary Diagnosis: Principal Problem:   MDD (major depressive disorder)  Long Term Goal(s):     Short Term Goals:       Medication Management: Evaluate patient's response, side effects, and tolerance of medication regimen.  Therapeutic Interventions: 1 to 1 sessions, Unit Group sessions and Medication administration.  Evaluation of Outcomes: Not Progressing   RN Treatment Plan for Primary Diagnosis: MDD (major depressive disorder) Long Term Goal(s): Knowledge of disease and therapeutic regimen to maintain health will improve  Short Term Goals: Ability to remain free from injury will improve, Ability to verbalize frustration and anger appropriately will improve, Ability to demonstrate self-control, Ability to participate in decision making will improve, Ability to verbalize feelings will improve, Ability to disclose and discuss suicidal ideas, Ability to identify and develop effective coping behaviors will improve, and Compliance with prescribed medications will improve  Medication Management: RN will administer medications as ordered by provider, will assess and evaluate patient's response and provide education to  patient for prescribed medication. RN will report any adverse and/or side effects to prescribing provider.  Therapeutic Interventions: 1 on 1 counseling sessions, Psychoeducation, Medication administration, Evaluate responses to treatment, Monitor vital signs and CBGs as ordered, Perform/monitor  CIWA, COWS, AIMS and Fall Risk screenings as ordered, Perform wound care treatments as ordered.  Evaluation of Outcomes: Not Progressing   LCSW Treatment Plan for Primary Diagnosis: MDD (major depressive disorder) Long Term Goal(s): Safe transition to appropriate next level of care at discharge, Engage patient in therapeutic group addressing interpersonal concerns.  Short Term Goals: Engage patient in aftercare planning with referrals and resources, Increase social support, Increase ability to appropriately verbalize feelings, Increase emotional regulation, Facilitate acceptance of mental health diagnosis and concerns, Facilitate patient progression through stages of change regarding substance use diagnoses and concerns, Identify triggers associated with mental health/substance abuse issues, and Increase skills for wellness and recovery  Therapeutic Interventions: Assess for all discharge needs, 1 to 1 time with Social worker, Explore available resources and support systems, Assess for adequacy in community support network, Educate family and significant other(s) on suicide prevention, Complete Psychosocial Assessment, Interpersonal group therapy.  Evaluation of Outcomes: Not Progressing   Progress in Treatment: Attending groups: No. Participating in groups: No. Taking medication as prescribed: Yes. Toleration medication: Yes. Family/Significant other contact made: No, will contact:  patient's mother, [name]. Patient understands diagnosis: Yes. Discussing patient identified problems/goals with staff: Yes. Medical problems stabilized or resolved: Yes. Denies suicidal/homicidal ideation: Yes. Issues/concerns per patient self-inventory: No. Other: No  New problem(s) identified: No, Describe:  None reported  New Short Term/Long Term Goal(s): N/A  Patient Goals:  "To work on my depression"  Discharge Plan or Barriers: Home  Reason for Continuation of Hospitalization:  Depression Suicidal ideation  Estimated Length of Stay: 5-7 days  Last 3 [place] Suicide Severity Risk Score: Flowsheet Row [facility] Most recent reading at 01/04/2024 10:48 PM ED from 01/04/2024 in [facility] Emergency Department at [facility] Most recent reading at 01/04/2024  4:58 AM ED from 12/06/2023 in [facility] Emergency Department at [facility] Most recent reading at 12/06/2023  4:21 PM  C-SSRS RISK CATEGORY High Risk High Risk No Risk       Last PHQ 2/9 Scores:    06/11/2023   10:37 AM  Depression screen PHQ 2/9  Decreased Interest 0  Down, Depressed, Hopeless 0  PHQ - 2 Score 0  Altered sleeping 1  Tired, decreased energy 1  Change in appetite 0  Feeling bad or failure about yourself  0  Trouble concentrating 3  Moving slowly or fidgety/restless 3  Suicidal thoughts 0  PHQ-9 Score 8  Difficult doing work/chores Not difficult at all    Scribe for Treatment Team: Bruchy Mikel M Ranbir Chew, LCSWA 01/05/2024 1:20 PM

## 2024-01-05 NOTE — Group Note (Signed)
 Recreation Therapy Group Note   Group Topic:Stress Management  Group Date: 01/05/2024 Start Time: 0935 End Time: 0955 Facilitators: Jahniya Duzan-McCall, LRT,CTRS Location: 300 [address] Dayroom   Group Topic: Stress Management  Goal Area(s) Addresses:  Patient will identify positive stress management techniques. Patient will identify benefits of using stress management post d/c.  Behavioral Response:   Intervention: Insight Timer App  Activity: Meditation. LRT played a meditation that focused encouraged patients to envision their purpose for the day. Patients were to sit, focus and visualize how they wanted their purpose to guide their day.    Education:  Stress Management, Discharge Planning.   Education Outcome: Acknowledges Education   Affect/Mood: N/A   Participation Level: Did not attend    Clinical Observations/Individualized Feedback:     Plan: Continue to engage patient in RT group sessions 2-3x/week.   Jaleigha Deane-McCall, LRT,CTRS 01/05/2024 12:09 PM
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
## 2024-01-05 NOTE — Progress Notes (Signed)
   01/05/24 0910  Psych Admission Type (Psych Patients Only)  Admission Status Voluntary  Psychosocial Assessment  Patient Complaints Anxiety;Depression  Eye Contact Fair  Facial Expression Animated  Affect Appropriate to circumstance  Speech Logical/coherent  Interaction Childlike  Motor Activity Other (Comment) (wnl)  Appearance/Hygiene Unremarkable  Behavior Characteristics Appropriate to situation  Mood Anxious;Depressed;Pleasant  Thought Process  Coherency WDL  Content WDL  Delusions None reported or observed  Perception WDL  Hallucination None reported or observed  Judgment Poor  Confusion None  Danger to Self  Current suicidal ideation? Denies  Agreement Not to Harm Self Yes  Description of Agreement Verbal  Danger to Others  Danger to Others None reported or observed

## 2024-01-05 NOTE — Plan of Care (Signed)
  Problem: Activity: Goal: Sleeping patterns will improve Outcome: Progressing   Problem: Coping: Goal: Ability to verbalize frustrations and anger appropriately will improve Outcome: Progressing Goal: Ability to demonstrate self-control will improve Outcome: Progressing   Problem: Safety: Goal: Periods of time without injury will increase Outcome: Progressing

## 2024-01-05 NOTE — Progress Notes (Signed)
   01/05/24 0530  15 Minute Checks  Location Bedroom  Visual Appearance Calm  Behavior Sleeping  Sleep (Behavioral Health Patients Only)  Calculate sleep? (Click Yes once per 24 hr at 0600 safety check) Yes  Documented sleep last 24 hours 7

## 2024-01-06 LAB — LIPID PANEL
Cholesterol: 117 mg/dL (ref 0–200)
HDL: 42 mg/dL (ref >40)
LDL Cholesterol: 69 mg/dL (ref 0–99)
Total CHOL/HDL Ratio: 2.8 ratio
Triglycerides: 32 mg/dL (ref <150)
VLDL: 6 mg/dL (ref 0–40)

## 2024-01-06 LAB — HEMOGLOBIN A1C
Hgb A1c MFr Bld: 5.3 % (ref 4.8–5.6)
Mean Plasma Glucose: 105.41 mg/dL

## 2024-01-06 MED ORDER — HYDROXYZINE HCL 25 MG PO TABS
25.0000 mg | ORAL_TABLET | Freq: Three times a day (TID) | ORAL | Status: DC | PRN
  Administered 2024-01-06: 25 mg via ORAL
  Filled 2024-01-06: qty 1

## 2024-01-06 MED ORDER — ESCITALOPRAM OXALATE 5 MG PO TABS
5.0000 mg | ORAL_TABLET | Freq: Every day | ORAL | Status: DC
  Administered 2024-01-07: 5 mg via ORAL
  Filled 2024-01-06: qty 1

## 2024-01-06 NOTE — Plan of Care (Signed)
   Problem: Education: Goal: Emotional status will improve Outcome: Progressing Goal: Mental status will improve Outcome: Progressing   Problem: Activity: Goal: Interest or engagement in activities will improve Outcome: Progressing Goal: Sleeping patterns will improve Outcome: Progressing

## 2024-01-06 NOTE — Group Note (Signed)
 Date:  01/06/2024 Time:  10:54 PM  Group Topic/Focus:  Wrap-Up Group:   The focus of this group is to help patients review their daily goal of treatment and discuss progress on daily workbooks.    Participation Level:  Did Not Attend  Participation Quality:  none  Affect:  none  Cognitive:  none  Insight: none  Engagement in Group:  none  Modes of Intervention:  none  Additional Comments:   Pt was encouraged but refused to attend wrap up group  Devany Aja A Damiah Mcdonald 01/06/2024, 10:54 PM

## 2024-01-06 NOTE — Plan of Care (Signed)
  Problem: Education: Goal: Knowledge of [facility] Education information/materials will improve Outcome: Progressing Goal: Emotional status will improve Outcome: Progressing Goal: Mental status will improve Outcome: Progressing Goal: Verbalization of understanding the information provided will improve Outcome: Progressing   Problem: Activity: Goal: Interest or engagement in activities will improve Outcome: Progressing   Problem: Coping: Goal: Ability to demonstrate self-control will improve Outcome: Progressing   Problem: Health Behavior/Discharge Planning: Goal: Identification of resources available to assist in meeting health care needs will improve Outcome: Progressing Goal: Compliance with treatment plan for underlying cause of condition will improve Outcome: Progressing   Problem: Physical Regulation: Goal: Ability to maintain clinical measurements within normal limits will improve Outcome: Progressing   Problem: Safety: Goal: Periods of time without injury will increase Outcome: Progressing   Problem: Education: Goal: Knowledge of the prescribed therapeutic regimen will improve Outcome: Progressing   Problem: Activity: Goal: Interest or engagement in leisure activities will improve Outcome: Progressing   Problem: Health Behavior/Discharge Planning: Goal: Ability to make decisions will improve Outcome: Progressing Goal: Compliance with therapeutic regimen will improve Outcome: Progressing   Problem: Role Relationship: Goal: Will demonstrate positive changes in social behaviors and relationships Outcome: Progressing   Problem: Safety: Goal: Ability to disclose and discuss suicidal ideas will improve Outcome: Progressing Goal: Ability to identify and utilize support systems that promote safety will improve Outcome: Progressing   Problem: Education: Goal: Ability to make informed decisions regarding treatment will improve Outcome: Progressing    Problem: Coping: Goal: Coping ability will improve Outcome: Progressing   Problem: Health Behavior/Discharge Planning: Goal: Identification of resources available to assist in meeting health care needs will improve Outcome: Progressing   Problem: Medication: Goal: Compliance with prescribed medication regimen will improve Outcome: Progressing   Problem: Self-Concept: Goal: Ability to disclose and discuss suicidal ideas will improve Outcome: Progressing

## 2024-01-06 NOTE — Group Note (Signed)
 LCSW Group Therapy Note   Group Date: 01/06/2024 Start Time: 1100 End Time: 1200  Participation: did not attend  Type of Therapy:  Group Therapy   Topic: Lifestyle:  from "One Day" to "Today is Day One"  Objective:  To promote mental and physical well-being through lifestyle changes in routine, nutrition, sleep, and movement.  Goals: Increase awareness of how lifestyle habits impact mental health. Encourage one small, achievable wellness goal. Support group sharing and accountability.  Summary:  Group members explored how daily habits influence mental health and discussed the importance of starting with small, manageable changes. Participants identified personal goals and shared reflections on improving structure, sleep, diet, and physical activity.  Therapeutic Modalities: CBT - Identifying and challenging all-or-nothing thinking; promoting realistic, helpful thoughts about change. Psychoeducation - Teaching about the impact of sleep, nutrition, movement, and routine on mental health. Motivational Interviewing - Eliciting personal motivation and exploring readiness for change. Goal-Setting - Supporting SMART goals to build self-efficacy and encourage follow-through.   Riggin Cuttino O Urho Rio, LCSWA 01/06/2024  12:26 PM

## 2024-01-06 NOTE — Group Note (Signed)
 Date:  01/06/2024 Time:  8:55 AM  Group Topic/Focus:  Goals Group:   The focus of this group is to help patients establish daily goals to achieve during treatment and discuss how the patient can incorporate goal setting into their daily lives to aide in recovery. Orientation:   The focus of this group is to educate the patient on the purpose and policies of crisis stabilization and provide a format to answer questions about their admission.  The group details unit policies and expectations of patients while admitted.    Participation Level:  Did Not Attend

## 2024-01-06 NOTE — Progress Notes (Signed)
   01/06/24 2100  Psych Admission Type (Psych Patients Only)  Admission Status Voluntary  Psychosocial Assessment  Patient Complaints Sleep disturbance  Eye Contact Fair  Facial Expression Flat  Affect Appropriate to circumstance  Speech Logical/coherent  Interaction Isolative  Motor Activity Other (Comment) (WNL)  Appearance/Hygiene Disheveled  Behavior Characteristics Unwilling to participate  Mood Other (Comment) (Sleepy)  Thought Process  Coherency WDL  Content WDL  Delusions None reported or observed  Perception WDL  Hallucination None reported or observed  Judgment Poor  Confusion None  Danger to Self  Current suicidal ideation?  (Denies)  Agreement Not to Harm Self Yes  Description of Agreement Notify Staff  Danger to Others  Danger to Others None reported or observed

## 2024-01-06 NOTE — [facility] Group Notes (Signed)
 Spiritual care group on grief and loss facilitated by Chaplain Nick Barman, [facility]  Group Goal: Support / Education around grief and loss  Members engage in facilitated group support and psycho-social education.  Group Description:  Following introductions and group rules, group members engaged in facilitated group dialogue and support around topic of loss, with particular support around experiences of loss in their lives. Group Identified types of loss (relationships / self / things) and identified patterns, circumstances, and changes that precipitate losses. Reflected on thoughts / feelings around loss, normalized grief responses, and recognized variety in grief experience. Group encouraged individual reflection on safe space and on the coping skills that they are already utilizing.  Group drew on Adlerian / Rogerian and narrative framework  Patient Progress: Alison Cummings attended group and engaged and participated in group conversation and activities.

## 2024-01-06 NOTE — Progress Notes (Signed)
   01/05/24 2040  Psych Admission Type (Psych Patients Only)  Admission Status Voluntary  Psychosocial Assessment  Patient Complaints Sleep disturbance  Eye Contact Fair  Facial Expression Other (Comment) (WNL)  Affect Appropriate to circumstance  Speech Logical/coherent  Interaction Needy  Motor Activity Other (Comment) (WNL)  Appearance/Hygiene Disheveled  Behavior Characteristics Appropriate to situation  Thought Process  Coherency WDL  Content WDL  Delusions None reported or observed  Perception WDL  Hallucination None reported or observed  Judgment Limited  Confusion None  Danger to Self  Current suicidal ideation?  (Denies)  Agreement Not to Harm Self Yes  Description of Agreement Notify Staff  Danger to Others  Danger to Others None reported or observed

## 2024-01-06 NOTE — Progress Notes (Signed)
 Pt refused to get out of bed and wake up to take morning medications despite multiple attempts from staff.

## 2024-01-06 NOTE — Progress Notes (Signed)
 Halina requested to meet following grief and loss group.  She wanted to discuss loss of a job and security as well as relationships and support. I provided listening and grief support.

## 2024-01-06 NOTE — Progress Notes (Addendum)
 Patient refused to take prescribed Lexapro  this afternoon.

## 2024-01-06 NOTE — Group Note (Unsigned)
 Date:  01/06/2024 Time:  2:28 PM  Group Topic/Focus:  Goals Group:   The focus of this group is to help patients establish daily goals to achieve during treatment and discuss how the patient can incorporate goal setting into their daily lives to aide in recovery.     Participation Level:  {[facility] PARTICIPATION ZOXWR:60454}  Participation Quality:  {[facility] PARTICIPATION QUALITY:22265}  Affect:  {[facility] AFFECT:22266}  Cognitive:  {[facility] COGNITIVE:22267}  Insight: {[facility] Insight2:20797}  Engagement in Group:  {[facility] ENGAGEMENT IN UJWJX:91478}  Modes of Intervention:  {[facility] MODES OF INTERVENTION:22269}  Additional Comments:  ***  Alison Cummings 01/06/2024, 2:28 PM

## 2024-01-06 NOTE — [facility] Counselor (Signed)
 Adult Comprehensive Assessment  Patient ID: Alison Cummings, female   DOB: 2001/02/16, 23 y.o.   MRN: [id number]  Information Source: Information source: Patient  Current Stressors:  Patient states their primary concerns and needs for treatment are:: "I guess work on my depression" Patient states their goals for this hospitilization and ongoing recovery are:: "deal with my depression and try to get on the right medications" Educational / Learning stressors: None reported Employment / Job issues: None reported Family Relationships: None reported Surveyor, quantity / Lack of resources (include bankruptcy): None reported Housing / Lack of housing: None reported Physical health (include injuries & life threatening diseases): "some problems with my thyroid  I think. My doctor was trying to figure out if it's things from my thyroid  that's causing certain symptoms or if it's my mental health issues" Social relationships: None reported Substance abuse: None reported Bereavement / Loss: None reported  Living/Environment/Situation:  Living Arrangements: Non-relatives/Friends Living conditions (as described by patient or guardian): "I'm living with a friend right now" Who else lives in the home?: "me and my best friend" How long has patient lived in current situation?: 3 weeks What is atmosphere in current home: Comfortable  Family History:  Marital status: Single Are you sexually active?: Yes What is your sexual orientation?: heterosexual Has your sexual activity been affected by drugs, alcohol, medication, or emotional stress?: No Does patient have children?: No  Childhood History:  Additional childhood history information: None reported Description of patient's relationship with caregiver when they were a child: "Good with my adoptive mom" Patient's description of current relationship with people who raised him/her: "We don't have a good relationship right now" How were you disciplined when you  got in trouble as a child/adolescent?: I got grounded Does patient have siblings?: Yes Number of Siblings: 2 (Pt reported having 2 half siblings; half brother deceased) Description of patient's current relationship with siblings: Patient stated "My brother died by shooting." Did patient suffer any verbal/emotional/physical/sexual abuse as a child?: No Did patient suffer from severe childhood neglect?: No Has patient ever been sexually abused/assaulted/raped as an adolescent or adult?: No Was the patient ever a victim of a crime or a disaster?: No Witnessed domestic violence?: No Has patient been affected by domestic violence as an adult?: No  Education:  Highest grade of school patient has completed: 12th grade Currently a student?: No Learning disability?: No  Employment/Work Situation:   Employment Situation: Unemployed Patient's Job has Been Impacted by Current Illness: No What is the Longest Time Patient has Held a Job?: N/A Where was the Patient Employed at that Time?: N/A Has Patient ever Been in the [employer]?: No  Financial Resources:   Surveyor, quantity resources: Food stamps Does patient have a Lawyer or guardian?: No  Alcohol/Substance Abuse:   What has been your use of drugs/alcohol within the last 12 months?: None reported If attempted suicide, did drugs/alcohol play a role in this?: No Alcohol/Substance Abuse Treatment Hx: Denies past history If yes, describe treatment: N/A Has alcohol/substance abuse ever caused legal problems?: No  Social Support System:   Forensic psychologist System: Poor Describe Community Support System: "My best friend that I live with" Type of faith/religion: Lynder Sanger How does patient's faith help to cope with current illness?: "praying helps"  Leisure/Recreation:   Do You Have Hobbies?: No  Strengths/Needs:   What is the patient's perception of their strengths?: "I don't have any" Patient states they can use these  personal strengths during their treatment to contribute to their  recovery: Patient declined to discuss Patient states these barriers may affect/interfere with their treatment: None reported Patient states these barriers may affect their return to the community: None reported Other important information patient would like considered in planning for their treatment: N/A  Discharge Plan:   Currently receiving community mental health services: No Patient states concerns and preferences for aftercare planning are: "I just want to go when I'm ready" Patient states they will know when they are safe and ready for discharge when: "I just want to go when I'm ready" Does patient have access to transportation?: No Does patient have financial barriers related to discharge medications?: Yes Patient description of barriers related to discharge medications: None reported Plan for no access to transportation at discharge: Patient will be provided transportation via taxi voucher. Will patient be returning to same living situation after discharge?: Yes (Patient reports returning to her friend's home once stable.)  Summary/Recommendations:   Summary and Recommendations (to be completed by the evaluator): Alison Cummings is a 23 year old African American woman voluntarily admitted from [facility] Emergency Department at [facility] due to depression, hallucinations, and suicidal ideation with no reported plan. Patient reported recently having an ectopic pregnancy a few months ago and has been feeling depressed ever since. She reported non-compliance with medications due to inability to always afford them. Patient additionally reported issues pertaining to her thyroid  which has been a problem of concern as she isn't sure her symptoms are related to mental health or physical illness. Patient denied the use of illicit, mood-altering substances including the consumoption of alcohol. Patient's unrinary drug screen  was negative for all illicit, mood-altering substances. Patient denied current engagement in outpatient mental health treatment including therapy and medication management.While here, [place] can benefit from crisis stabilization, medication management, therapeutic milieu, and referrals for services.   Derek Laughter M Bruk Tumolo, LCSWA 01/06/2024

## 2024-01-06 NOTE — Progress Notes (Addendum)
 Pt woke up at 1200 asking for morning medications. Pt reports she overslept due to taking PRN Zyprexa last night which made her super groggy and tired this morning.

## 2024-01-06 NOTE — Progress Notes (Signed)
 [facility] MD Progress Note  01/06/2024 1:36 PM Alison Cummings  MRN:  [id number] Subjective:   Alison Cummings is a 23 yr old female who presented on 6/1 to [facility] with SI, she was admitted to [facility] on 6/2. PPHx is significant for Bipolar Disorder and ADHD, and a remote history of Self Injurious behavior (cutting last 2021) and 2 Prior Psychiatric Hospitalizations (last- [facility] 10/2023).    Case was discussed in the multidisciplinary team. MAR was reviewed and patient was compliant with medications.  She received PRN Zyprexa last night for anxiety.    Psychiatric Team made the following recommendations yesterday: -Start Abilify  2 mg and increase to 5 mg tomorrow for augmentation and mood stability    On interview today patient reports she slept good last night.  She reports her appetite is doing good.  She reports she continues to have SI but that this is improving and lessening and she does contract for safety.  She reports no HI or AVH.  She reports no Paranoia or Ideas of Reference.  She reports no issues with her medications.  Discussed with her that since she did well restarting her Abilify  we would increase it today.  Discussed with her that we would also restart her Lexapro  and she was agreeable.  She reports no other concerns at present.    Principal Problem: MDD (major depressive disorder), recurrent severe, without psychosis (HCC) Diagnosis: Principal Problem:   MDD (major depressive disorder), recurrent severe, without psychosis (HCC) Active Problems:   PTSD (post-traumatic stress disorder)  Total Time spent with patient:  I personally spent 35 minutes on the unit in direct patient care. The direct patient care time included face-to-face time with the patient, reviewing the patient's chart, communicating with other professionals, and coordinating care. Greater than 50% of this time was spent in counseling or coordinating care with the patient regarding goals of hospitalization,  psycho-education, and discharge planning needs.   Past Psychiatric History:  Bipolar Disorder and ADHD, and a remote history of Self Injurious behavior (cutting last 2021) and 2 Prior Psychiatric Hospitalizations (last- [facility] 10/2023).   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   IMO SNOMED Dx Update Oct 2024     High blood pressure 06/11/2023   Hypertension    Hyperthyroidism 06/11/2023   Obesity    Seasonal allergies    Seizures (HCC)     Past Surgical History:  Procedure Laterality Date   ADENOIDECTOMY     Family History:  Family History  Problem Relation Age of Onset   Schizophrenia Mother    High blood pressure Mother    Hyperlipidemia Mother    High blood pressure Father    ADD / ADHD Father    Bipolar disorder Father    Stroke Father    Diabetes Sister    Hyperlipidemia Sister    Family Psychiatric  History:  Mother- Schizophrenia  Father- Bipolar Disorder, ADHD, Crake Abuse No Known Suicides  Social History:  Social History   Substance and Sexual Activity  Alcohol Use No     Social History   Substance and Sexual Activity  Drug Use Not Currently   Types: Marijuana   Comment: sometimes  Social History   Socioeconomic History   Marital status: Single    Spouse name: Not on file   Number of children: Not on file   Years of education: Not on file   Highest education level: Not on file  Occupational History   Not on file  Tobacco Use   Smoking status: Former    Types: Cigarettes   Smokeless tobacco: Never  Vaping Use   Vaping status: Never Used  Substance and Sexual Activity   Alcohol use: No   Drug use: Not Currently    Types: Marijuana     Comment: sometimes   Sexual activity: Yes    Birth control/protection: None  Other Topics Concern   Not on file  Social History Narrative   Lives with her mother and grandmother    Social Drivers of Health   Financial Resource Strain: Low Risk  (10/22/2023)   Received from [facility]   Overall Financial Resource Strain (CARDIA)    Difficulty of Paying Living Expenses: Not hard at all  Food Insecurity: Food Insecurity Present (01/04/2024)   Hunger Vital Sign    Worried About Running Out of Food in the Last Year: Sometimes true    Ran Out of Food in the Last Year: Sometimes true  Transportation Needs: No Transportation Needs (01/04/2024)   PRAPARE - Administrator, Civil Service (Medical): No    Lack of Transportation (Non-Medical): No  Physical Activity: Not on File (11/22/2021)   Received from [place], [place]   Physical Activity    Physical Activity: 0  Stress: Not on File (11/22/2021)   Received from [facility], [place]   Stress    Stress: 0  Social Connections: Not on File (04/19/2023)   Received from [facility]   Social Connections    Connectedness: 0   Additional Social History:                         Sleep: Good  Appetite:  Good  Current Medications: Current Facility-Administered Medications  Medication Dose Route Frequency Provider Last Rate Last Admin   acetaminophen  (TYLENOL ) tablet 650 mg  650 mg Oral Q6H PRN [name], NP       alum & mag hydroxide-simeth (MAALOX/MYLANTA) 200-200-20 MG/5ML suspension 30 mL  30 mL Oral Q4H PRN [name], NP       ARIPiprazole  (ABILIFY ) tablet 5 mg  5 mg Oral Daily Ava Tangney S, DO   5 mg at 01/06/24 1207   hydrOXYzine  (ATARAX ) tablet 25 mg  25 mg Oral TID PRN Basilia Bosworth, DO       magnesium  hydroxide (MILK OF MAGNESIA) suspension 30 mL  30 mL Oral Daily PRN [name], NP       methimazole  (TAPAZOLE ) tablet 10 mg  10 mg Oral BID Paulyne Mooty S, DO   10 mg at 01/05/24 1654    metoprolol  succinate (TOPROL -XL) 24 hr tablet 50 mg  50 mg Oral Daily Kynley Metzger S, DO   50 mg at 01/05/24 1653   OLANZapine (ZYPREXA) injection 10 mg  10 mg Intramuscular TID PRN [name], NP       OLANZapine (ZYPREXA) injection 5 mg  5 mg Intramuscular TID PRN [name], NP       OLANZapine zydis (ZYPREXA) disintegrating tablet 5 mg  5 mg Oral TID PRN [name], NP   5 mg at 01/05/24 2119    Lab Results:  Results for orders placed or  performed during the hospital encounter of 01/04/24 (from the past 48 hours)  Lipid panel     Status: None   Collection Time: 01/06/24  6:37 AM  Result Value Ref Range   Cholesterol 117 0 - 200 mg/dL   Triglycerides 32 <782 mg/dL   HDL 42 >95 mg/dL   Total CHOL/HDL Ratio 2.8 RATIO   VLDL 6 0 - 40 mg/dL   LDL Cholesterol 69 0 - 99 mg/dL    Comment:        Total Cholesterol/HDL:CHD Risk Coronary Heart Disease Risk Table                     Men   Women  1/2 Average Risk   3.4   3.3  Average Risk       5.0   4.4  2 X Average Risk   9.6   7.1  3 X Average Risk  23.4   11.0        Use the calculated Patient Ratio above and the CHD Risk Table to determine the patient's CHD Risk.        ATP III CLASSIFICATION (LDL):  <100     mg/dL   Optimal  621-308  mg/dL   Near or Above                    Optimal  130-159  mg/dL   Borderline  657-846  mg/dL   High  >962     mg/dL   Very High Performed at [facility], [address], [place], [place] [address]   Hemoglobin A1c     Status: None   Collection Time: 01/06/24  6:37 AM  Result Value Ref Range   Hgb A1c MFr Bld 5.3 4.8 - 5.6 %    Comment: (NOTE) Diagnosis of Diabetes The following HbA1c ranges recommended by the American Diabetes Association (ADA) may be used as an aid in the diagnosis of diabetes mellitus.  Hemoglobin             Suggested A1C NGSP%              Diagnosis  <5.7                   Non Diabetic  5.7-6.4                 Pre-Diabetic  >6.4                   Diabetic  <7.0                   Glycemic control for                       adults with diabetes.     Mean Plasma Glucose 105.41 mg/dL    Comment: Performed at [facility], [address], [place], [place] [address]    Blood Alcohol level:  Lab Results  Component Value Date   [facility] <15 01/04/2024   ETH <10 04/10/2023    Metabolic Disorder Labs: Lab Results  Component Value Date   HGBA1C 5.3 01/06/2024   MPG 105.41 01/06/2024   Lab Results  Component Value Date   PROLACTIN 13.8 04/10/2023   Lab Results  Component Value Date   CHOL 117 01/06/2024   TRIG 32 01/06/2024   HDL 42 01/06/2024   CHOLHDL 2.8 01/06/2024   VLDL 6 01/06/2024  LDLCALC 69 01/06/2024    Physical Findings: AIMS:  , ,  ,  ,    CIWA:    COWS:     Musculoskeletal: Strength & Muscle Tone: within normal limits Gait & Station: normal Patient leans: N/A  Psychiatric Specialty Exam:  Presentation  General Appearance:  Casual  Eye Contact: Fair  Speech: Clear and Coherent; Normal Rate  Speech Volume: Normal  Handedness: Right   Mood and Affect  Mood: Dysphoric  Affect: Congruent   Thought Process  Thought Processes: Coherent  Descriptions of Associations:Intact  Orientation:Full (Time, Place and Person)  Thought Content:WDL  History of Schizophrenia/Schizoaffective disorder:Yes  Duration of Psychotic Symptoms:No data recorded Hallucinations:Hallucinations: None  Ideas of Reference:None  Suicidal Thoughts:Suicidal Thoughts: Yes, Active (improving, contracts for safety)  Homicidal Thoughts:Homicidal Thoughts: No   Sensorium  Memory: Immediate Fair  Judgment: Fair  Insight: Fair   Art therapist  Concentration: Fair  Attention Span: Fair  Recall: [employer] of Knowledge: Fair  Language: Fair   Psychomotor Activity  Psychomotor Activity: Psychomotor Activity: Normal   Assets   Assets: Communication Skills; Desire for Improvement; Resilience; Physical Health   Sleep  Sleep: Sleep: Good    Physical Exam: Physical Exam Vitals and nursing note reviewed.  Constitutional:      General: She is not in acute distress.    Appearance: Normal appearance. She is obese. She is not ill-appearing or toxic-appearing.  HENT:     Head: Normocephalic and atraumatic.  Pulmonary:     Effort: Pulmonary effort is normal.  Musculoskeletal:        General: Normal range of motion.  Neurological:     General: No focal deficit present.     Mental Status: She is alert.    Review of Systems  Respiratory:  Negative for cough and shortness of breath.   Cardiovascular:  Negative for chest pain.  Gastrointestinal:  Negative for abdominal pain, constipation, diarrhea, nausea and vomiting.  Neurological:  Negative for dizziness, weakness and headaches.  Psychiatric/Behavioral:  Positive for depression and suicidal ideas (improving). Negative for hallucinations. The patient is nervous/anxious.    Blood pressure (!) 97/54, pulse (!) 106, temperature (!) 97.5 F (36.4 C), temperature source Oral, resp. rate 18, height 5\' 2"  (1.575 m), weight 90 kg, last menstrual period 12/19/2023, SpO2 100%, unknown if currently breastfeeding. Body mass index is 36.29 kg/m.   Treatment Plan Summary: Daily contact with patient to assess and evaluate symptoms and progress in treatment and Medication management  Camora Tremain is a 23 yr old female who [facility], she was admitted to [facility] on 6/2.  PPHx is significant for Bipolar Disorder and ADHD, and a remote history of Self Injurious behavior (cutting last 2021) and 2 Prior Psychiatric Hospitalizations (last- [facility] 10/2023).   Darcia tolerated restarting the Abilify  so we will continue with the plan to increase the Abilify  further today.  We will restart her Lexapro   We will start PRN Hydroxyzine  so that if she gets  anxious she can receive it.  We will not make any other changes to her medications at this time.  We will continue to monitor.      MDD, Recurrent, Severe, w/out Psychosis  GAD  PTSD: -Increase Abilify  to 5 mg tomorrow for augmentation and mood stability -Restart Lexapro  5 mg daily for depression and anxiety -Continue Agitation Protocol: Zyprexa     -Continue home Methimazole  10 mg BID for Hyperthyroidism  -Continue Metoprolol  XL 50  mg daily for HTN -Continue PRN's: Tylenol , Maalox, Atarax , Milk of Magnesia, Trazodone    --  The risks/benefits/side-effects/alternatives to medications were discussed in detail with the patient and time was given for questions. The patient consents to medication trials.                -- Metabolic profile and EKG monitoring obtained while on an atypical antipsychotic  CMP: WNL, CBC: WNL except RBC: 5.15, MCV: 74.4, MCH: 23.7, RDW: 16.1, Plat: 138, UDS: Neg, hCG quant: 10, TSH: <0.010, Free T4: 5.26,  A1c: 5.3,  Lipid Panel: WNL,  EKG: Sinus Tach (102) w/ Qtc: 417             -- Encouraged patient to participate in unit milieu and in scheduled group therapies              -- Short Term Goals: Ability to identify changes in lifestyle to reduce recurrence of condition will improve, Ability to verbalize feelings will improve, Ability to disclose and discuss suicidal ideas, Ability to demonstrate self-control will improve, Ability to identify and develop effective coping behaviors will improve, Ability to maintain clinical measurements within normal limits will improve, Compliance with prescribed medications will improve, and Ability to identify triggers associated with substance abuse/mental health issues will improve             -- Long Term Goals: Improvement in symptoms so as ready for discharge   Safety and Monitoring:             -- Voluntary admission to inpatient psychiatric unit for safety, stabilization and treatment             -- Daily contact with  patient to assess and evaluate symptoms and progress in treatment             -- Patient's case to be discussed in multi-disciplinary team meeting             -- Observation Level : q15 minute checks             -- Vital signs:  q12 hours             -- Precautions: suicide, elopement, and assault  Discharge Planning:              -- Social work and case management to assist with discharge planning and identification of hospital follow-up needs prior to discharge             -- Estimated LOS: 5-7 more days             -- Discharge Concerns: Need to establish a safety plan; Medication compliance and effectiveness             -- Discharge Goals: Return home with outpatient referrals for mental health follow-up including medication management/psychotherapy   Basilia Bosworth, DO 01/06/2024, 1:36 PM

## 2024-01-06 NOTE — Progress Notes (Signed)
   01/06/24 1036  Psych Admission Type (Psych Patients Only)  Admission Status Voluntary  Psychosocial Assessment  Patient Complaints Anxiety;Depression  Eye Contact Fair  Facial Expression Flat;Sad  Affect Irritable  Speech Logical/coherent  Interaction Assertive  Motor Activity Slow  Appearance/Hygiene Unremarkable  Behavior Characteristics Irritable  Mood Depressed;Anxious;Irritable  Thought Process  Coherency WDL  Content WDL  Delusions None reported or observed  Perception WDL  Hallucination None reported or observed  Judgment Limited  Confusion None  Danger to Self  Current suicidal ideation? Denies  Description of Suicide Plan No plan  Agreement Not to Harm Self Yes  Description of Agreement verbal  Danger to Others  Danger to Others None reported or observed

## 2024-01-07 MED ORDER — ESCITALOPRAM OXALATE 10 MG PO TABS
10.0000 mg | ORAL_TABLET | Freq: Every day | ORAL | Status: DC
  Filled 2024-01-07: qty 1

## 2024-01-07 MED ORDER — ARIPIPRAZOLE 10 MG PO TABS: 5.0000 mg | ORAL_TABLET | Freq: Every day | ORAL | Status: DC

## 2024-01-07 MED ORDER — ESCITALOPRAM OXALATE 10 MG PO TABS: 10.0000 mg | ORAL_TABLET | Freq: Every day | ORAL | Status: DC

## 2024-01-07 NOTE — Progress Notes (Signed)
 [facility] MD Progress Note  01/07/2024 11:11 AM Alison Cummings  MRN:  [id number] Subjective:   Alison Cummings is a 23 yr old female who presented on 6/1 to [facility] with SI, she was admitted to [facility] on 6/2. PPHx is significant for Bipolar Disorder and ADHD, and a remote history of Self Injurious behavior (cutting last 2021) and 2 Prior Psychiatric Hospitalizations (last- [facility] 10/2023).    Case was discussed in the multidisciplinary team. MAR was reviewed and patient was compliant with medications.  She received PRN Zyprexa last night for anxiety.    Psychiatric Team made the following recommendations yesterday: -Increase Abilify  to 5 mg tomorrow for augmentation and mood stability -Restart Lexapro  5 mg daily for depression and anxiety    On interview today patient reports she slept good last night.  She reports her appetite is doing good.  She reports no SI, HI, or AVH.  She reports no Paranoia or Ideas of Reference.  She reports no issues with her medications.  She reports that she tolerated restarting her Lexapro  without issue.  She reports some concern about her court date on 6/6 and missing it and having a warrant.  Discussed with her that if she is still hospitalized we would be able to send a letter to the [place] of the Court to ask for the court date to be moved.  She reports having concern about her continued elevation of her hCG.  Discussed that we would contact OB/GYN to ask for recommendations.  She reports no other concerns at present.   Called on-call provider for OB/GYN.  Discussed patient's history of ectopic pregnancy and methotrexate injections and subsequent hCG levels and the dates they were drawn.  Discussed that the patient still had concern that her level was elevated at 10 on admission and requested recommendations.  He reports that this level would be expected given her history of ectopic pregnancy and methotrexate injection.  He recommended that she contact her OB/GYN  and have a repeat blood test in 1 to 2 weeks he recommended that no immediate action he has been taken at this time.     Principal Problem: MDD (major depressive disorder), recurrent severe, without psychosis (HCC) Diagnosis: Principal Problem:   MDD (major depressive disorder), recurrent severe, without psychosis (HCC) Active Problems:   PTSD (post-traumatic stress disorder)  Total Time spent with patient:  I personally spent 35 minutes on the unit in direct patient care. The direct patient care time included face-to-face time with the patient, reviewing the patient's chart, communicating with other professionals, and coordinating care. Greater than 50% of this time was spent in counseling or coordinating care with the patient regarding goals of hospitalization, psycho-education, and discharge planning needs.   Past Psychiatric History:  Bipolar Disorder and ADHD, and a remote history of Self Injurious behavior (cutting last 2021) and 2 Prior Psychiatric Hospitalizations (last- [facility] 10/2023).   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   IMO SNOMED Dx Update Oct 2024     High blood pressure 06/11/2023   Hypertension    Hyperthyroidism 06/11/2023   Obesity    Seasonal allergies    Seizures (HCC)     Past Surgical History:  Procedure Laterality Date   ADENOIDECTOMY     Family History:  Family History  Problem Relation Age of Onset   Schizophrenia Mother    High blood pressure Mother    Hyperlipidemia Mother    High blood pressure Father    ADD / ADHD Father    Bipolar disorder Father    Stroke Father    Diabetes Sister    Hyperlipidemia Sister    Family Psychiatric   History:  Mother- Schizophrenia  Father- Bipolar Disorder, ADHD, Crake Abuse No Known Suicides  Social History:  Social History   Substance and Sexual Activity  Alcohol Use No     Social History   Substance and Sexual Activity  Drug Use Not Currently   Types: Marijuana   Comment: sometimes    Social History   Socioeconomic History   Marital status: Single    Spouse name: Not on file   Number of children: Not on file   Years of education: Not on file   Highest education level: Not on file  Occupational History   Not on file  Tobacco Use   Smoking status: Former    Types: Cigarettes   Smokeless tobacco: Never  Vaping Use   Vaping status: Never Used  Substance and Sexual Activity   Alcohol use: No   Drug use: Not Currently    Types: Marijuana    Comment: sometimes   Sexual activity: Yes    Birth control/protection: None  Other Topics Concern   Not on file  Social History Narrative   Lives with her mother and grandmother    Social Drivers of Health   Financial Resource Strain: Low Risk  (10/22/2023)   Received from [facility]   Overall Financial Resource Strain (CARDIA)    Difficulty of Paying Living Expenses: Not hard at all  Food Insecurity: Food Insecurity Present (01/04/2024)   Hunger Vital Sign    Worried About Running Out of Food in the Last Year: Sometimes true    Ran Out of Food in the Last Year: Sometimes true  Transportation Needs: No Transportation Needs (01/04/2024)   PRAPARE - Administrator, Civil Service (Medical): No    Lack of Transportation (Non-Medical): No  Physical Activity: Not on File (11/22/2021)   Received from [place], [place]   Physical Activity    Physical Activity: 0  Stress: Not on File (11/22/2021)   Received from [facility], [place]   Stress    Stress: 0  Social Connections: Not on File (04/19/2023)   Received from [facility]   Social Connections    Connectedness: 0   Additional Social History:                          Sleep: Good  Appetite:  Good  Current Medications: Current Facility-Administered Medications  Medication Dose Route Frequency Provider Last Rate Last Admin   acetaminophen  (TYLENOL ) tablet 650 mg  650 mg Oral Q6H PRN [name], NP       alum & mag hydroxide-simeth (MAALOX/MYLANTA) 200-200-20 MG/5ML suspension 30 mL  30 mL Oral Q4H PRN [name], NP       ARIPiprazole  (ABILIFY ) tablet 5 mg  5 mg Oral Daily Auston Halfmann S, DO   5 mg at 01/07/24 0742   [START ON 01/08/2024] escitalopram  (LEXAPRO ) tablet 10 mg  10 mg Oral Daily Lee Kalt S, DO       hydrOXYzine  (ATARAX ) tablet 25 mg  25 mg  Oral TID PRN Basilia Bosworth, DO   25 mg at 01/06/24 2137   magnesium  hydroxide (MILK OF MAGNESIA) suspension 30 mL  30 mL Oral Daily PRN [name], NP       methimazole  (TAPAZOLE ) tablet 10 mg  10 mg Oral BID Kamarri Lovvorn S, DO   10 mg at 01/07/24 4098   metoprolol  succinate (TOPROL -XL) 24 hr tablet 50 mg  50 mg Oral Daily Calynn Ferrero S, DO   50 mg at 01/07/24 0742   OLANZapine (ZYPREXA) injection 10 mg  10 mg Intramuscular TID PRN [name], NP       OLANZapine (ZYPREXA) injection 5 mg  5 mg Intramuscular TID PRN [name], NP       OLANZapine zydis (ZYPREXA) disintegrating tablet 5 mg  5 mg Oral TID PRN [name], NP   5 mg at 01/05/24 2119    Lab Results:  Results for orders placed or performed during the hospital encounter of 01/04/24 (from the past 48 hours)  Lipid panel     Status: None   Collection Time: 01/06/24  6:37 AM  Result Value Ref Range   Cholesterol 117 0 - 200 mg/dL   Triglycerides 32 <119 mg/dL   HDL 42 >14 mg/dL   Total CHOL/HDL Ratio 2.8 RATIO   VLDL 6 0 - 40 mg/dL   LDL Cholesterol 69 0 - 99 mg/dL    Comment:        Total Cholesterol/HDL:CHD Risk Coronary Heart Disease Risk Table                     Men   Women  1/2 Average Risk   3.4   3.3  Average Risk       5.0   4.4  2 X Average Risk    9.6   7.1  3 X Average Risk  23.4   11.0        Use the calculated Patient Ratio above and the CHD Risk Table to determine the patient's CHD Risk.        ATP III CLASSIFICATION (LDL):  <100     mg/dL   Optimal  782-956  mg/dL   Near or Above                    Optimal  130-159  mg/dL   Borderline  213-086  mg/dL   High  >578     mg/dL   Very High Performed at [facility], [address], [place], [place] [address]   Hemoglobin A1c     Status: None   Collection Time: 01/06/24  6:37 AM  Result Value Ref Range   Hgb A1c MFr Bld 5.3 4.8 - 5.6 %    Comment: (NOTE) Diagnosis of Diabetes The following HbA1c ranges recommended by the American Diabetes Association (ADA) may be used as an aid in the diagnosis of diabetes mellitus.  Hemoglobin             Suggested A1C NGSP%              Diagnosis  <5.7                   Non Diabetic  5.7-6.4                Pre-Diabetic  >6.4                   Diabetic  <7.0  Glycemic control for                       adults with diabetes.     Mean Plasma Glucose 105.41 mg/dL    Comment: Performed at [facility], [address] [place], [place] [address]    Blood Alcohol level:  Lab Results  Component Value Date   [facility] <15 01/04/2024   ETH <10 04/10/2023    Metabolic Disorder Labs: Lab Results  Component Value Date   HGBA1C 5.3 01/06/2024   MPG 105.41 01/06/2024   Lab Results  Component Value Date   PROLACTIN 13.8 04/10/2023   Lab Results  Component Value Date   CHOL 117 01/06/2024   TRIG 32 01/06/2024   HDL 42 01/06/2024   CHOLHDL 2.8 01/06/2024   VLDL 6 01/06/2024   LDLCALC 69 01/06/2024    Physical Findings: AIMS:  , ,  ,  ,    CIWA:    COWS:     Musculoskeletal: Strength & Muscle Tone: within normal limits Gait & Station: normal Patient leans: N/A  Psychiatric Specialty Exam:  Presentation  General Appearance:  Appropriate for Environment; Casual  Eye  Contact: Fair  Speech: Clear and Coherent; Normal Rate  Speech Volume: Normal  Handedness: Right   Mood and Affect  Mood: Dysphoric  Affect: Congruent   Thought Process  Thought Processes: Coherent; Goal Directed  Descriptions of Associations:Intact  Orientation:Full (Time, Place and Person)  Thought Content:WDL  History of Schizophrenia/Schizoaffective disorder:Yes  Duration of Psychotic Symptoms:No data recorded Hallucinations:Hallucinations: None  Ideas of Reference:None  Suicidal Thoughts:Suicidal Thoughts: No  Homicidal Thoughts:Homicidal Thoughts: No   Sensorium  Memory: Immediate Fair  Judgment: Fair  Insight: Fair   Art therapist  Concentration: Fair  Attention Span: Fair  Recall: [employer] of Knowledge: Fair  Language: Fair   Psychomotor Activity  Psychomotor Activity: Psychomotor Activity: Normal   Assets  Assets: Communication Skills; Desire for Improvement; Physical Health; Resilience   Sleep  Sleep: Sleep: Good    Physical Exam: Physical Exam Vitals and nursing note reviewed.  Constitutional:      General: She is not in acute distress.    Appearance: Normal appearance. She is obese. She is not ill-appearing or toxic-appearing.  HENT:     Head: Normocephalic and atraumatic.  Pulmonary:     Effort: Pulmonary effort is normal.  Musculoskeletal:        General: Normal range of motion.  Neurological:     General: No focal deficit present.     Mental Status: She is alert.    Review of Systems  Respiratory:  Negative for cough and shortness of breath.   Cardiovascular:  Negative for chest pain.  Gastrointestinal:  Negative for abdominal pain, constipation, diarrhea, nausea and vomiting.  Neurological:  Negative for dizziness, weakness and headaches.  Psychiatric/Behavioral:  Positive for depression. Negative for hallucinations and suicidal ideas. The patient is nervous/anxious.    Blood  pressure 113/85, pulse (!) 121, temperature 97.9 F (36.6 C), temperature source Oral, resp. rate 20, height 5\' 2"  (1.575 m), weight 90 kg, last menstrual period 12/19/2023, SpO2 100%, unknown if currently breastfeeding. Body mass index is 36.29 kg/m.   Treatment Plan Summary: Daily contact with patient to assess and evaluate symptoms and progress in treatment and Medication management  Alison Cummings is a 23 yr old female who presented on 6/1 to [facility] with SI, she was admitted to [facility] on 6/2.  PPHx  is significant for Bipolar Disorder and ADHD, and a remote history of Self Injurious behavior (cutting last 2021) and 2 Prior Psychiatric Hospitalizations (last- [facility] 10/2023).    Makinzee responded well to the restart of her Lexapro .  She continues to have significant depression so we will plan to increase her Lexapro  tomorrow.  She did have concerns about her continued elevated hCG and so contacted OB/GYN for recommendations.  The on-call provider recommended outpatient follow up in 1-2 weeks for repeat blood test but no further actions needed to be done at this time.  We will not make any other changes to her medication at this time.  We will continue to monitor.     MDD, Recurrent, Severe, w/out Psychosis  GAD  PTSD: -Continue Abilify  5 mg tomorrow for augmentation and mood stability -Increase Lexapro  to 10 mg daily for depression and anxiety tomorrow -Continue Agitation Protocol: Zyprexa     -Continue home Methimazole  10 mg BID for Hyperthyroidism  -Continue Metoprolol  XL 50 mg daily for HTN -Continue PRN's: Tylenol , Maalox, Atarax , Milk of Magnesia, Trazodone    --  The risks/benefits/side-effects/alternatives to medications were discussed in detail with the patient and time was given for questions. The patient consents to medication trials.                -- Metabolic profile and EKG monitoring obtained while on an atypical antipsychotic  CMP: WNL, CBC: WNL except RBC: 5.15,  MCV: 74.4, MCH: 23.7, RDW: 16.1, Plat: 138, UDS: Neg, hCG quant: 10, TSH: <0.010, Free T4: 5.26,  A1c: 5.3,  Lipid Panel: WNL,  EKG: Sinus Tach (102) w/ Qtc: 417             -- Encouraged patient to participate in unit milieu and in scheduled group therapies              -- Short Term Goals: Ability to identify changes in lifestyle to reduce recurrence of condition will improve, Ability to verbalize feelings will improve, Ability to disclose and discuss suicidal ideas, Ability to demonstrate self-control will improve, Ability to identify and develop effective coping behaviors will improve, Ability to maintain clinical measurements within normal limits will improve, Compliance with prescribed medications will improve, and Ability to identify triggers associated with substance abuse/mental health issues will improve             -- Long Term Goals: Improvement in symptoms so as ready for discharge   Safety and Monitoring:             -- Voluntary admission to inpatient psychiatric unit for safety, stabilization and treatment             -- Daily contact with patient to assess and evaluate symptoms and progress in treatment             -- Patient's case to be discussed in multi-disciplinary team meeting             -- Observation Level : q15 minute checks             -- Vital signs:  q12 hours             -- Precautions: suicide, elopement, and assault  Discharge Planning:              -- Social work and case management to assist with discharge planning and identification of hospital follow-up needs prior to discharge             -- Estimated  LOS: 4-6 more days             -- Discharge Concerns: Need to establish a safety plan; Medication compliance and effectiveness             -- Discharge Goals: Return home with outpatient referrals for mental health follow-up including medication management/psychotherapy   Basilia Bosworth, DO 01/07/2024, 11:11 AM

## 2024-01-07 NOTE — BHH Group Notes (Signed)
 Adult Psychoeducational Group Note  Date:  01/07/2024 Time:  12:00 PM  Group Topic/Focus:  Goals Group:   The focus of this group is to help patients establish daily goals to achieve during treatment and discuss how the patient can incorporate goal setting into their daily lives to aide in recovery.  Participation Level:  Did Not Attend  Participation Quality:  na  Affect:  na  Cognitive:  na  Insight: na  Engagement in Group:  na  Modes of Intervention:  na  Additional Comments:  Pt did not attend grooup  [employer] 01/07/2024, 12:00 PM

## 2024-01-07 NOTE — Plan of Care (Signed)
   Problem: Education: Goal: Knowledge of [facility] Education information/materials will improve Outcome: Progressing Goal: Emotional status will improve Outcome: Progressing Goal: Verbalization of understanding the information provided will improve Outcome: Progressing

## 2024-01-07 NOTE — Plan of Care (Signed)
  Problem: Safety: Goal: Periods of time without injury will increase Outcome: Progressing   Problem: Activity: Goal: Interest or engagement in activities will improve Outcome: Not Progressing

## 2024-01-07 NOTE — Group Note (Signed)
 Recreation Therapy Group Note   Group Topic:Health and Wellness  Group Date: 01/07/2024 Start Time: 0940 End Time: 1000 Facilitators: Elward Nocera-McCall, LRT,CTRS Location: [address] Dayroom   Group Topic: Wellness  Goal Area(s) Addresses:  Patient will define components of whole wellness. Patient will verbalize benefit of whole wellness.  Behavioral Response:   Intervention: Music  Activity: Exercise. LRT and patients completed a series of stretches and exercises. Patients took turns leading the group in the exercises and stretches of their choosing. Patients were encouraged to get water and take breaks as needed.   Education: Wellness, Building control surveyor.   Education Outcome: Acknowledges education/In group clarification offered/Needs additional education.    Affect/Mood: N/A   Participation Level: Did not attend    Clinical Observations/Individualized Feedback:     Plan: Continue to engage patient in RT group sessions 2-3x/week.   Alyn Jurney-McCall, LRT,CTRS 01/07/2024 1:08 PM

## 2024-01-07 NOTE — Progress Notes (Signed)
   01/07/24 1400  Psych Admission Type (Psych Patients Only)  Admission Status Voluntary  Psychosocial Assessment  Patient Complaints Sleep disturbance;Other (Comment) (Reported she is goal oriented to attend 1-3 groups, drink 3 cups of water, and go outside today.  Pt denied SI/HI/AVH, anxiety, and depression)  Eye Contact Fair  Facial Expression Animated  Affect Appropriate to circumstance  Speech Logical/coherent  Interaction Assertive;Other (Comment) (Social in the milieu; sleepy and napping in the day)  Appearance/Hygiene Disheveled  Behavior Characteristics Other (Comment) (Minimal participation)  Mood Depressed  Thought Process  Coherency WDL  Content WDL  Delusions None reported or observed  Perception WDL  Hallucination None reported or observed  Judgment Poor  Confusion None  Danger to Self  Current suicidal ideation? Denies  Agreement Not to Harm Self Yes  Description of Agreement Verbal  Danger to Others  Danger to Others None reported or observed

## 2024-01-07 NOTE — Progress Notes (Signed)
 Patient refused lexapro  10mg  @ 2000. Patient stated, 'I don't take meds at night, you don't know what you're talking about." Patient encouraged to take meds, patient continued to refused stating, 'it makes me sick at night, I don't want it.'

## 2024-01-07 NOTE — Progress Notes (Signed)
   01/07/24 2000  Psych Admission Type (Psych Patients Only)  Admission Status Voluntary  Psychosocial Assessment  Patient Complaints None  Eye Contact Fair  Facial Expression Flat  Affect Irritable  Speech Logical/coherent  Interaction Isolative  Motor Activity Slow  Appearance/Hygiene Disheveled  Behavior Characteristics Unable to participate  Mood Depressed  Thought Process  Coherency WDL  Content WDL  Delusions None reported or observed  Perception WDL  Hallucination None reported or observed  Judgment Poor  Confusion None  Danger to Self  Current suicidal ideation? Denies  Agreement Not to Harm Self Yes  Description of Agreement verbal  Danger to Others  Danger to Others None reported or observed

## 2024-01-07 NOTE — Group Note (Signed)
 Date:  01/07/2024 Time:  8:26 PM  Group Topic/Focus:  AA/ NA    Participation Level:  Did Not Attend  Participation Quality:  Resistant  Affect:  Resistant  Cognitive:  Lacking  Insight: Lacking  Engagement in Group:  Lacking  Modes of Intervention:  Discussion  Additional Comments:  Patient did not attend AA/NA group  Moishe Angel 01/07/2024, 8:26 PM

## 2024-01-07 NOTE — Plan of Care (Signed)
  Problem: Education: Goal: Knowledge of [facility] Education information/materials will improve Outcome: Progressing Goal: Emotional status will improve Outcome: Progressing Goal: Mental status will improve Outcome: Progressing Goal: Verbalization of understanding the information provided will improve Outcome: Progressing   Problem: Activity: Goal: Interest or engagement in activities will improve Outcome: Progressing Goal: Sleeping patterns will improve Outcome: Progressing   Problem: Coping: Goal: Ability to demonstrate self-control will improve Outcome: Progressing   Problem: Health Behavior/Discharge Planning: Goal: Identification of resources available to assist in meeting health care needs will improve Outcome: Progressing Goal: Compliance with treatment plan for underlying cause of condition will improve Outcome: Progressing   Problem: Physical Regulation: Goal: Ability to maintain clinical measurements within normal limits will improve Outcome: Progressing   Problem: Education: Goal: Utilization of techniques to improve thought processes will improve Outcome: Progressing

## 2024-01-08 ENCOUNTER — Inpatient Hospital Stay (HOSPITAL_COMMUNITY)

## 2024-01-08 ENCOUNTER — Inpatient Hospital Stay (HOSPITAL_COMMUNITY)
Admission: EM | Admit: 2024-01-08 | Discharge: 2024-01-10 | DRG: 644 | Disposition: A | Discharge status: Other Acute Inpatient Hospital | Source: Intra-hospital | Attending: Internal Medicine | Admitting: Internal Medicine

## 2024-01-08 ENCOUNTER — Encounter (HOSPITAL_COMMUNITY): Payer: Self-pay

## 2024-01-08 DIAGNOSIS — Z91148 Patient's other noncompliance with medication regimen for other reason: Secondary | ICD-10-CM

## 2024-01-08 DIAGNOSIS — F319 Bipolar disorder, unspecified: Secondary | ICD-10-CM | POA: Diagnosis not present

## 2024-01-08 DIAGNOSIS — Z833 Family history of diabetes mellitus: Secondary | ICD-10-CM | POA: Diagnosis not present

## 2024-01-08 DIAGNOSIS — Z9152 Personal history of nonsuicidal self-harm: Secondary | ICD-10-CM

## 2024-01-08 DIAGNOSIS — F332 Major depressive disorder, recurrent severe without psychotic features: Secondary | ICD-10-CM | POA: Diagnosis not present

## 2024-01-08 DIAGNOSIS — R000 Tachycardia, unspecified: Secondary | ICD-10-CM | POA: Diagnosis present

## 2024-01-08 DIAGNOSIS — Z888 Allergy status to other drugs, medicaments and biological substances status: Secondary | ICD-10-CM

## 2024-01-08 DIAGNOSIS — Z91199 Patient's noncompliance with other medical treatment and regimen due to unspecified reason: Secondary | ICD-10-CM

## 2024-01-08 DIAGNOSIS — F419 Anxiety disorder, unspecified: Secondary | ICD-10-CM | POA: Diagnosis not present

## 2024-01-08 DIAGNOSIS — R45851 Suicidal ideations: Secondary | ICD-10-CM | POA: Diagnosis present

## 2024-01-08 DIAGNOSIS — Z8759 Personal history of other complications of pregnancy, childbirth and the puerperium: Secondary | ICD-10-CM | POA: Diagnosis not present

## 2024-01-08 DIAGNOSIS — Z91018 Allergy to other foods: Secondary | ICD-10-CM

## 2024-01-08 DIAGNOSIS — Z823 Family history of stroke: Secondary | ICD-10-CM

## 2024-01-08 DIAGNOSIS — Z79899 Other long term (current) drug therapy: Secondary | ICD-10-CM

## 2024-01-08 DIAGNOSIS — E039 Hypothyroidism, unspecified: Secondary | ICD-10-CM | POA: Diagnosis not present

## 2024-01-08 DIAGNOSIS — Z6836 Body mass index (BMI) 36.0-36.9, adult: Secondary | ICD-10-CM

## 2024-01-08 DIAGNOSIS — T39315A Adverse effect of propionic acid derivatives, initial encounter: Secondary | ICD-10-CM | POA: Diagnosis present

## 2024-01-08 DIAGNOSIS — F902 Attention-deficit hyperactivity disorder, combined type: Secondary | ICD-10-CM | POA: Diagnosis present

## 2024-01-08 DIAGNOSIS — R55 Syncope and collapse: Secondary | ICD-10-CM | POA: Diagnosis present

## 2024-01-08 DIAGNOSIS — E0500 Thyrotoxicosis with diffuse goiter without thyrotoxic crisis or storm: Secondary | ICD-10-CM | POA: Diagnosis not present

## 2024-01-08 DIAGNOSIS — T781XXA Other adverse food reactions, not elsewhere classified, initial encounter: Secondary | ICD-10-CM | POA: Diagnosis not present

## 2024-01-08 DIAGNOSIS — I10 Essential (primary) hypertension: Secondary | ICD-10-CM | POA: Diagnosis not present

## 2024-01-08 DIAGNOSIS — F4310 Post-traumatic stress disorder, unspecified: Secondary | ICD-10-CM | POA: Diagnosis present

## 2024-01-08 DIAGNOSIS — Z9181 History of falling: Secondary | ICD-10-CM | POA: Diagnosis not present

## 2024-01-08 DIAGNOSIS — Z5901 Sheltered homelessness: Secondary | ICD-10-CM

## 2024-01-08 DIAGNOSIS — Z818 Family history of other mental and behavioral disorders: Secondary | ICD-10-CM

## 2024-01-08 DIAGNOSIS — Z87891 Personal history of nicotine dependence: Secondary | ICD-10-CM

## 2024-01-08 DIAGNOSIS — F329 Major depressive disorder, single episode, unspecified: Secondary | ICD-10-CM | POA: Diagnosis not present

## 2024-01-08 DIAGNOSIS — E0590 Thyrotoxicosis, unspecified without thyrotoxic crisis or storm: Secondary | ICD-10-CM | POA: Diagnosis not present

## 2024-01-08 DIAGNOSIS — F259 Schizoaffective disorder, unspecified: Secondary | ICD-10-CM | POA: Diagnosis not present

## 2024-01-08 LAB — CBC WITH DIFFERENTIAL/PLATELET
Abs Immature Granulocytes: 0.01 10*3/uL (ref 0.00–0.07)
Basophils Absolute: 0 10*3/uL (ref 0.0–0.1)
Basophils Relative: 0 %
Eosinophils Absolute: 0.1 10*3/uL (ref 0.0–0.5)
Eosinophils Relative: 3 %
HCT: 38.7 % (ref 36.0–46.0)
Hemoglobin: 12.2 g/dL (ref 12.0–15.0)
Immature Granulocytes: 0 %
Lymphocytes Relative: 47 %
Lymphs Abs: 2.6 10*3/uL (ref 0.7–4.0)
MCH: 23.4 pg — ABNORMAL LOW (ref 26.0–34.0)
MCHC: 31.5 g/dL (ref 30.0–36.0)
MCV: 74.1 fL — ABNORMAL LOW (ref 80.0–100.0)
Monocytes Absolute: 0.7 10*3/uL (ref 0.1–1.0)
Monocytes Relative: 13 %
Neutro Abs: 2.1 10*3/uL (ref 1.7–7.7)
Neutrophils Relative %: 37 %
Platelets: 206 10*3/uL (ref 150–400)
RBC: 5.22 MIL/uL — ABNORMAL HIGH (ref 3.87–5.11)
RDW: 15.7 % — ABNORMAL HIGH (ref 11.5–15.5)
WBC: 5.6 10*3/uL (ref 4.0–10.5)
nRBC: 0 % (ref 0.0–0.2)

## 2024-01-08 LAB — COMPREHENSIVE METABOLIC PANEL WITH GFR
ALT: 62 U/L — ABNORMAL HIGH (ref 0–44)
AST: 42 U/L — ABNORMAL HIGH (ref 15–41)
Albumin: 2.9 g/dL — ABNORMAL LOW (ref 3.5–5.0)
Alkaline Phosphatase: 103 U/L (ref 38–126)
Anion gap: 7 (ref 5–15)
BUN: 15 mg/dL (ref 6–20)
CO2: 26 mmol/L (ref 22–32)
Calcium: 9.3 mg/dL (ref 8.9–10.3)
Chloride: 105 mmol/L (ref 98–111)
Creatinine, Ser: 0.74 mg/dL (ref 0.44–1.00)
GFR, Estimated: >60 mL/min (ref >60)
Glucose, Bld: 96 mg/dL (ref 70–99)
Potassium: 4.2 mmol/L (ref 3.5–5.1)
Sodium: 138 mmol/L (ref 135–145)
Total Bilirubin: 0.5 mg/dL (ref 0.0–1.2)
Total Protein: 6.8 g/dL (ref 6.5–8.1)

## 2024-01-08 LAB — D-DIMER, QUANTITATIVE: D-Dimer, Quant: 0.41 ug{FEU}/mL (ref 0.00–0.50)

## 2024-01-08 LAB — T4, FREE: Free T4: 4.99 ng/dL — ABNORMAL HIGH (ref 0.61–1.12)

## 2024-01-08 LAB — HCG, QUANTITATIVE, PREGNANCY: hCG, Beta Chain, Quant, S: 7 m[IU]/mL — ABNORMAL HIGH (ref <5)

## 2024-01-08 LAB — TROPONIN I (HIGH SENSITIVITY)
Troponin I (High Sensitivity): 4 ng/L (ref <18)
Troponin I (High Sensitivity): 3 ng/L (ref <18)

## 2024-01-08 LAB — MAGNESIUM: Magnesium: 1.7 mg/dL (ref 1.7–2.4)

## 2024-01-08 LAB — TSH: TSH: <0.01 u[IU]/mL — ABNORMAL LOW (ref 0.350–4.500)

## 2024-01-08 MED ORDER — METHIMAZOLE 10 MG PO TABS: 10.0000 mg | ORAL_TABLET | Freq: Two times a day (BID) | ORAL | Status: DC

## 2024-01-08 MED ORDER — METOPROLOL SUCCINATE ER 50 MG PO TB24: 50.0000 mg | ORAL_TABLET | Freq: Every day | ORAL | Status: DC

## 2024-01-08 MED ORDER — LORAZEPAM 2 MG/ML IJ SOLN
INTRAMUSCULAR | Status: DC
Start: 2024-01-08 — End: 2024-01-08
  Filled 2024-01-08: qty 1

## 2024-01-08 MED ORDER — ACETAMINOPHEN 325 MG PO TABS
650.0000 mg | ORAL_TABLET | Freq: Once | ORAL | Status: AC
  Administered 2024-01-08: 650 mg via ORAL
  Filled 2024-01-08: qty 2

## 2024-01-08 MED ORDER — LACTATED RINGERS IV BOLUS
1000.0000 mL | Freq: Once | INTRAVENOUS | Status: AC
  Administered 2024-01-08: 1000 mL via INTRAVENOUS

## 2024-01-08 MED ORDER — ARIPIPRAZOLE 5 MG PO TABS: 5.0000 mg | ORAL_TABLET | Freq: Every day | ORAL | Status: DC

## 2024-01-08 MED ORDER — ENOXAPARIN SODIUM 40 MG/0.4ML IJ SOSY
40.0000 mg | PREFILLED_SYRINGE | INTRAMUSCULAR | Status: DC
  Filled 2024-01-08 (×2): qty 0.4

## 2024-01-08 MED ORDER — METOPROLOL TARTRATE 5 MG/5ML IV SOLN
2.5000 mg | Freq: Once | INTRAVENOUS | Status: AC
  Administered 2024-01-08: 2.5 mg via INTRAVENOUS
  Filled 2024-01-08: qty 5

## 2024-01-08 MED ORDER — LORAZEPAM 1 MG PO TABS: 2.0000 mg | ORAL_TABLET | ORAL | Status: DC

## 2024-01-08 MED ORDER — LORAZEPAM 2 MG/ML IJ SOLN: 2.0000 mg | INTRAMUSCULAR | Status: DC

## 2024-01-08 MED ORDER — ESCITALOPRAM OXALATE 10 MG PO TABS: 10.0000 mg | ORAL_TABLET | Freq: Every day | ORAL | Status: DC

## 2024-01-08 NOTE — ED Triage Notes (Addendum)
 Patient from [facility] for reported seizure, felt dizzy, fell. Remembers waking up with staff in bathroom. Approximately 2 min seizure. Alert and oriented x4. HA and right arm pain-tender to upper shoulder. Patient takes abilify , metoprolol , restarted lexapro  yesterday,   Pupils equal reactive, no hematomas. No urinary incontinence or oral trauma.  EKG 12 lead sinus tach 120s BP 124/68 but initially 140/100 98 on room air RR 18  C-collar for precaution, cleared by EMS.   MHT at bedside

## 2024-01-08 NOTE — ED Notes (Signed)
 Per Reggie in staffing, no sitters available at this time. Sitter to be sent when available.
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
## 2024-01-08 NOTE — ED Notes (Signed)
 Report given patient transferred to floor at this time.

## 2024-01-08 NOTE — ED Notes (Signed)
 Lab called by RN, lab to run now
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
## 2024-01-08 NOTE — Progress Notes (Signed)
 [facility] MD Progress Note  01/08/2024 3:33 PM Alison Cummings  MRN:  [id number] Subjective:   Alison Cummings is a 23 yr old female who presented on 6/1 to [facility] with SI, she was admitted to [facility] on 6/2. PPHx is significant for Bipolar Disorder and ADHD, and a remote history of Self Injurious behavior (cutting last 2021) and 2 Prior Psychiatric Hospitalizations (last- [facility] 10/2023).    Case was discussed in the multidisciplinary team. MAR was reviewed and patient was compliant with medications.  She received PRN Zyprexa last night for anxiety.    Psychiatric Team made the following recommendations yesterday: -Continue Abilify  5 mg tomorrow for augmentation and mood stability -Increase Lexapro  to 10 mg daily for depression and anxiety tomorrow    On interview today patient reports she slept good last night.  She reports her appetite is doing good.  She reports no SI, HI, or AVH.  She reports no Paranoia or Ideas of Reference.  She reports no issues with her medications.  She reports that she has been dizzy and lightheaded this morning.  She reports that she has not been drinking much and encouraged her to increase fluids.  She reports no other concerns present.  Was contacted by nursing staff approximately 8:10 AM that patient was found down in the bathroom.  At that time patient was not responsive to verbal or painful (pinching) stimuli.  Ordered 2 mg IM Ativan  and went to call ED provider.  After that returned the patient side and she was sitting up and talking.  She reported that she had been using the bathroom and then got up to wash her hands and while she was washing her hands she became more dizzy and then could not remember what happened afterwards.  She reports that the left side of her head was hurting but she was unsure if she hit her head or not.  Discussed with her that we had called EMS and she would be transferred to the ED for workup.  She reported understanding and had no other  concerns present.   Called ED provider Dr. Ranelle Buys. discussed that patient was found down and there was concern for thyrotoxic storm or other medical issue and that given her behavior and nonresponse she was given 2 mg IM Ativan .  He did accept the patient.    Principal Problem: MDD (major depressive disorder), recurrent severe, without psychosis (HCC) Diagnosis: Principal Problem:   MDD (major depressive disorder), recurrent severe, without psychosis (HCC) Active Problems:   PTSD (post-traumatic stress disorder)  Total Time spent with patient:  I personally spent 35 minutes on the unit in direct patient care. The direct patient care time included face-to-face time with the patient, reviewing the patient's chart, communicating with other professionals, and coordinating care. Greater than 50% of this time was spent in counseling or coordinating care with the patient regarding goals of hospitalization, psycho-education, and discharge planning needs.   Past Psychiatric History:  Bipolar Disorder and ADHD, and a remote history of Self Injurious behavior (cutting last 2021) and 2 Prior Psychiatric Hospitalizations (last- [facility] 10/2023).   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   IMO SNOMED Dx Update Oct 2024  High blood pressure 06/11/2023   Hypertension    Hyperthyroidism 06/11/2023   Obesity    Seasonal allergies    Seizures (HCC)     Past Surgical History:  Procedure Laterality Date   ADENOIDECTOMY     Family History:  Family History  Problem Relation Age of Onset   Schizophrenia Mother    High blood pressure Mother    Hyperlipidemia Mother    High blood pressure Father     ADD / ADHD Father    Bipolar disorder Father    Stroke Father    Diabetes Sister    Hyperlipidemia Sister    Family Psychiatric  History:  Mother- Schizophrenia  Father- Bipolar Disorder, ADHD, Crake Abuse No Known Suicides  Social History:  Social History   Substance and Sexual Activity  Alcohol Use No     Social History   Substance and Sexual Activity  Drug Use Not Currently   Types: Marijuana   Comment: sometimes    Social History   Socioeconomic History   Marital status: Single    Spouse name: Not on file   Number of children: Not on file   Years of education: Not on file   Highest education level: Not on file  Occupational History   Not on file  Tobacco Use   Smoking status: Former    Types: Cigarettes   Smokeless tobacco: Never  Vaping Use   Vaping status: Never Used  Substance and Sexual Activity   Alcohol use: No   Drug use: Not Currently    Types: Marijuana    Comment: sometimes   Sexual activity: Yes    Birth control/protection: None  Other Topics Concern   Not on file  Social History Narrative   Lives with her mother and grandmother    Social Drivers of Health   Financial Resource Strain: Low Risk  (10/22/2023)   Received from [facility]   Overall Financial Resource Strain (CARDIA)    Difficulty of Paying Living Expenses: Not hard at all  Food Insecurity: Food Insecurity Present (01/04/2024)   Hunger Vital Sign    Worried About Running Out of Food in the Last Year: Sometimes true    Ran Out of Food in the Last Year: Sometimes true  Transportation Needs: No Transportation Needs (01/04/2024)   PRAPARE - Administrator, Civil Service (Medical): No    Lack of Transportation (Non-Medical): No  Physical Activity: Not on File (11/22/2021)   Received from [place], [place]   Physical Activity    Physical Activity: 0  Stress: Not on File (11/22/2021)   Received from [facility], [place]   Stress    Stress: 0  Social Connections: Not on File  (04/19/2023)   Received from [facility]   Social Connections    Connectedness: 0   Additional Social History:                         Sleep: Good  Appetite:  Good  Current Medications: Current Facility-Administered Medications  Medication Dose Route Frequency Provider Last Rate Last Admin   acetaminophen  (TYLENOL ) tablet 650 mg  650 mg Oral Q6H PRN [name], NP       alum & mag hydroxide-simeth (MAALOX/MYLANTA) 200-200-20 MG/5ML suspension 30 mL  30 mL Oral Q4H PRN [name], NP       ARIPiprazole  (ABILIFY ) tablet 5 mg  5 mg Oral QHS Izediuno, Iline Mallory, MD       escitalopram  (  LEXAPRO ) tablet 10 mg  10 mg Oral QHS Izediuno, Iline Mallory, MD       hydrOXYzine  (ATARAX ) tablet 25 mg  25 mg Oral TID PRN Vallery Mcdade S, DO   25 mg at 01/06/24 2137   LORazepam  (ATIVAN ) 2 MG/ML injection            LORazepam  (ATIVAN ) tablet 2 mg  2 mg Oral NOW Daniell Mancinas S, DO       Or   LORazepam  (ATIVAN ) injection 2 mg  2 mg Intramuscular NOW Haydyn Liddell S, DO       magnesium  hydroxide (MILK OF MAGNESIA) suspension 30 mL  30 mL Oral Daily PRN [name], NP       methimazole  (TAPAZOLE ) tablet 10 mg  10 mg Oral BID Kelin Borum S, DO   10 mg at 01/08/24 0272   metoprolol  succinate (TOPROL -XL) 24 hr tablet 50 mg  50 mg Oral Daily Niah Heinle S, DO   50 mg at 01/08/24 0749   OLANZapine (ZYPREXA) injection 10 mg  10 mg Intramuscular TID PRN [name], NP       OLANZapine (ZYPREXA) injection 5 mg  5 mg Intramuscular TID PRN [name], NP       OLANZapine zydis (ZYPREXA) disintegrating tablet 5 mg  5 mg Oral TID PRN [name], NP   5 mg at 01/05/24 2119   Current Outpatient Medications  Medication Sig Dispense Refill   escitalopram  (LEXAPRO ) 10 MG tablet Take 1 tablet (10 mg total) by mouth daily for 14 days. 14 tablet 0   hydrOXYzine  (ATARAX ) 25 MG tablet Take 1 tablet (25 mg total) by mouth every 6 (six) hours as needed for  itching or anxiety. 12 tablet 0   methimazole  (TAPAZOLE ) 10 MG tablet Take 1 tablet (10 mg total) by mouth 2 (two) times daily. 60 tablet 1   metoprolol  succinate (TOPROL -XL) 50 MG 24 hr tablet Take 1 tablet (50 mg total) by mouth daily. 30 tablet 1   risperiDONE  (RISPERDAL ) 0.25 MG tablet Take 0.25 mg by mouth in the morning and at bedtime.      Lab Results:  Results for orders placed or performed during the hospital encounter of 01/04/24 (from the past 48 hours)  Magnesium      Status: None   Collection Time: 01/08/24  9:25 AM  Result Value Ref Range   Magnesium  1.7 1.7 - 2.4 mg/dL    Comment: Performed at [facility], [address] [place], [place] [address]  TSH     Status: Abnormal   Collection Time: 01/08/24  9:25 AM  Result Value Ref Range   TSH <0.010 (L) 0.350 - 4.500 uIU/mL    Comment: Performed by a 3rd Generation assay with a functional sensitivity of <=0.01 uIU/mL. Performed at [facility], [address], [place], [place] [address]   T4, free     Status: Abnormal   Collection Time: 01/08/24  9:25 AM  Result Value Ref Range   Free T4 4.99 (H) 0.61 - 1.12 ng/dL    Comment: (NOTE) Biotin ingestion may interfere with free T4 tests. If the results are inconsistent with the TSH level, previous test results, or the clinical presentation, then consider biotin interference. If needed, order repeat testing after stopping biotin. Performed at [facility], [address], [place], [place] [address]   Comprehensive metabolic panel     Status: Abnormal   Collection Time: 01/08/24  9:25 AM  Result Value  Ref Range   Sodium 138 135 - 145 mmol/L   Potassium 4.2 3.5 - 5.1 mmol/L   Chloride 105 98 - 111 mmol/L   CO2 26 22 - 32 mmol/L   Glucose, Bld 96 70 - 99 mg/dL    Comment: Glucose reference range applies only to samples taken after fasting for at least 8 hours.   BUN 15 6 - 20 mg/dL   Creatinine, Ser 1.61 0.44 - 1.00 mg/dL   Calcium 9.3 8.9 - 09.6 mg/dL    Total Protein 6.8 6.5 - 8.1 g/dL   Albumin 2.9 (L) 3.5 - 5.0 g/dL   AST 42 (H) 15 - 41 U/L   ALT 62 (H) 0 - 44 U/L   Alkaline Phosphatase 103 38 - 126 U/L   Total Bilirubin 0.5 0.0 - 1.2 mg/dL   GFR, Estimated >04 >54 mL/min    Comment: (NOTE) Calculated using the CKD-EPI Creatinine Equation (2021)    Anion gap 7 5 - 15    Comment: Performed at [facility], [address] [place], [place] [address]  Troponin I (High Sensitivity)     Status: None   Collection Time: 01/08/24  9:25 AM  Result Value Ref Range   Troponin I (High Sensitivity) 4 <18 ng/L    Comment: (NOTE) Elevated high sensitivity troponin I (hsTnI) values and significant  changes across serial measurements may suggest ACS but many other  chronic and acute conditions are known to elevate hsTnI results.  Refer to the "Links" section for chest pain algorithms and additional  guidance. Performed at [facility], [address] [place], [place] [address]   D-dimer, quantitative     Status: None   Collection Time: 01/08/24  9:25 AM  Result Value Ref Range   D-Dimer, Quant 0.41 0.00 - 0.50 ug/mL-FEU    Comment: (NOTE) At the manufacturer cut-off value of 0.5 g/mL FEU, this assay has a negative predictive value of 95-100%.This assay is intended for use in conjunction with a clinical pretest probability (PTP) assessment model to exclude pulmonary embolism (PE) and deep venous thrombosis (DVT) in outpatients suspected of PE or DVT. Results should be correlated with clinical presentation. Performed at [facility], [address], [place], [place] [address]   CBC with Differential     Status: Abnormal   Collection Time: 01/08/24  9:25 AM  Result Value Ref Range   WBC 5.6 4.0 - 10.5 K/uL   RBC 5.22 (H) 3.87 - 5.11 MIL/uL   Hemoglobin 12.2 12.0 - 15.0 g/dL   HCT 13.0 86.5 - 78.4 %   MCV 74.1 (L) 80.0 - 100.0 fL   MCH 23.4 (L) 26.0 - 34.0 pg   MCHC 31.5 30.0 - 36.0 g/dL   RDW 69.6 (H) 29.5 -  15.5 %   Platelets 206 150 - 400 K/uL    Comment: REPEATED TO VERIFY   nRBC 0.0 0.0 - 0.2 %   Neutrophils Relative % 37 %   Neutro Abs 2.1 1.7 - 7.7 K/uL   Lymphocytes Relative 47 %   Lymphs Abs 2.6 0.7 - 4.0 K/uL   Monocytes Relative 13 %   Monocytes Absolute 0.7 0.1 - 1.0 K/uL   Eosinophils Relative 3 %   Eosinophils Absolute 0.1 0.0 - 0.5 K/uL   Basophils Relative 0 %   Basophils Absolute 0.0 0.0 - 0.1 K/uL   Immature Granulocytes 0 %   Abs Immature Granulocytes 0.01 0.00 - 0.07 K/uL  Comment: Performed at [facility], [address] [place], [place] [address]  hCG, quantitative, pregnancy     Status: Abnormal   Collection Time: 01/08/24 10:40 AM  Result Value Ref Range   hCG, Beta Chain, Quant, S 7 (H) <5 mIU/mL    Comment:          GEST. AGE      CONC.  (mIU/mL)   <=1 WEEK        5 - 50     2 WEEKS       50 - 500     3 WEEKS       100 - 10,000     4 WEEKS     1,000 - 30,000     5 WEEKS     3,500 - 115,000   6-8 WEEKS     12,000 - 270,000    12 WEEKS     15,000 - 220,000        FEMALE AND NON-PREGNANT FEMALE:     LESS THAN 5 mIU/mL Performed at [facility], [address] [place], [place] [address]   Troponin I (High Sensitivity)     Status: None   Collection Time: 01/08/24 12:44 PM  Result Value Ref Range   Troponin I (High Sensitivity) 3 <18 ng/L    Comment: (NOTE) Elevated high sensitivity troponin I (hsTnI) values and significant  changes across serial measurements may suggest ACS but many other  chronic and acute conditions are known to elevate hsTnI results.  Refer to the "Links" section for chest pain algorithms and additional  guidance. Performed at [facility], [address] [place], [place] [address]     Blood Alcohol level:  Lab Results  Component Value Date   [facility] <15 01/04/2024   ETH <10 04/10/2023    Metabolic Disorder Labs: Lab Results  Component Value Date   HGBA1C 5.3 01/06/2024   MPG 105.41 01/06/2024   Lab  Results  Component Value Date   PROLACTIN 13.8 04/10/2023   Lab Results  Component Value Date   CHOL 117 01/06/2024   TRIG 32 01/06/2024   HDL 42 01/06/2024   CHOLHDL 2.8 01/06/2024   VLDL 6 01/06/2024   LDLCALC 69 01/06/2024    Physical Findings: AIMS:  , ,  ,  ,    CIWA:    COWS:     Musculoskeletal: Strength & Muscle Tone: within normal limits Gait & Station: normal Patient leans: N/A  Psychiatric Specialty Exam:  Presentation  General Appearance:  Appropriate for Environment; Casual  Eye Contact: Fair  Speech: Clear and Coherent; Normal Rate  Speech Volume: Normal  Handedness: Right   Mood and Affect  Mood: Dysphoric  Affect: Congruent   Thought Process  Thought Processes: Coherent; Goal Directed  Descriptions of Associations:Intact  Orientation:Full (Time, Place and Person)  Thought Content:WDL  History of Schizophrenia/Schizoaffective disorder:Yes  Duration of Psychotic Symptoms:No data recorded Hallucinations:Hallucinations: None  Ideas of Reference:None  Suicidal Thoughts:Suicidal Thoughts: No  Homicidal Thoughts:Homicidal Thoughts: No   Sensorium  Memory: Immediate Fair  Judgment: Fair  Insight: Fair   Art therapist  Concentration: Fair  Attention Span: Fair  Recall: [employer] of Knowledge: Fair  Language: Fair   Psychomotor Activity  Psychomotor Activity: Psychomotor Activity: Normal   Assets  Assets: Communication Skills; Desire for Improvement; Physical Health; Resilience   Sleep  Sleep: Sleep: Good    Physical Exam: Physical Exam Vitals and nursing note reviewed.  Constitutional:      General: She is not in acute distress.    Appearance: Normal appearance. She is obese. She is not ill-appearing or toxic-appearing.  HENT:     Head: Normocephalic and atraumatic.  Pulmonary:     Effort: Pulmonary effort is normal.  Musculoskeletal:        General: Normal range of motion.   Neurological:     General: No focal deficit present.     Mental Status: She is alert.    Review of Systems  Respiratory:  Negative for cough and shortness of breath.   Cardiovascular:  Negative for chest pain.  Gastrointestinal:  Negative for abdominal pain, constipation, diarrhea, nausea and vomiting.  Neurological:  Positive for dizziness and loss of consciousness. Negative for weakness and headaches.  Psychiatric/Behavioral:  Positive for depression. Negative for hallucinations and suicidal ideas. The patient is nervous/anxious.    Blood pressure (!) 110/59, pulse (!) 118, temperature 98 F (36.7 C), temperature source Oral, resp. rate (!) 32, height 5\' 2"  (1.575 m), weight 90 kg, last menstrual period 12/19/2023, SpO2 100%, unknown if currently breastfeeding. Body mass index is 36.29 kg/m.   Treatment Plan Summary: Daily contact with patient to assess and evaluate symptoms and progress in treatment and Medication management  Dashea Mcmullan is a 23 yr old female who presented on 6/1 to [facility] with SI, she was admitted to [facility] on 6/2.  PPHx is significant for Bipolar Disorder and ADHD, and a remote history of Self Injurious behavior (cutting last 2021) and 2 Prior Psychiatric Hospitalizations (last- [facility] 10/2023).    Ladeidra was reporting dizziness and lightheadedness.  Encouraged her to drink fluids and lay down.  About planets after interview was found down in her room displaying some spastic motions.  She was not responsive to verbal or painful (pinching) stimuli so 2 mg IM Ativan  was ordered and ED provider was contacted.  She was then taken by EMS to the ED.     MDD, Recurrent, Severe, w/out Psychosis  GAD  PTSD: -Continue Abilify  5 mg QHS for augmentation and mood stability -Continue Lexapro  10 mg QHS for depression and anxiety tomorrow -Continue Agitation Protocol: Zyprexa     -Continue home Methimazole  10 mg BID for Hyperthyroidism  -Continue Metoprolol  XL 50  mg daily for HTN -Continue PRN's: Tylenol , Maalox, Atarax , Milk of Magnesia, Trazodone    --  The risks/benefits/side-effects/alternatives to medications were discussed in detail with the patient and time was given for questions. The patient consents to medication trials.                -- Metabolic profile and EKG monitoring obtained while on an atypical antipsychotic  CMP: WNL, CBC: WNL except RBC: 5.15, MCV: 74.4, MCH: 23.7, RDW: 16.1, Plat: 138, UDS: Neg, hCG quant: 10, TSH: <0.010, Free T4: 5.26,  A1c: 5.3,  Lipid Panel: WNL,  EKG: Sinus Tach (102) w/ Qtc: 417             -- Encouraged patient to participate in unit milieu and in scheduled group therapies              -- Short Term Goals: Ability to identify changes in lifestyle to reduce recurrence of condition will improve, Ability to verbalize feelings will improve, Ability to disclose and discuss suicidal ideas, Ability to demonstrate self-control will improve, Ability to identify and develop effective coping behaviors will improve, Ability to maintain clinical measurements within normal limits will improve, Compliance with prescribed medications will  improve, and Ability to identify triggers associated with substance abuse/mental health issues will improve             -- Long Term Goals: Improvement in symptoms so as ready for discharge   Safety and Monitoring:             -- Voluntary admission to inpatient psychiatric unit for safety, stabilization and treatment             -- Daily contact with patient to assess and evaluate symptoms and progress in treatment             -- Patient's case to be discussed in multi-disciplinary team meeting             -- Observation Level : q15 minute checks             -- Vital signs:  q12 hours             -- Precautions: suicide, elopement, and assault  Discharge Planning:              -- Social work and case management to assist with discharge planning and identification of hospital follow-up  needs prior to discharge             -- Estimated LOS: 3-5 more days             -- Discharge Concerns: Need to establish a safety plan; Medication compliance and effectiveness             -- Discharge Goals: Return home with outpatient referrals for mental health follow-up including medication management/psychotherapy   Basilia Bosworth, DO 01/08/2024, 3:33 PM

## 2024-01-08 NOTE — ED Notes (Signed)
 MD at bedside.

## 2024-01-08 NOTE — ED Notes (Signed)
 Patient transported to CT

## 2024-01-08 NOTE — ED Provider Notes (Signed)
 [place] EMERGENCY DEPARTMENT AT [facility] Provider Note   CSN: [id number] Arrival date & time: 01/08/24  4098     History  Chief Complaint  Patient presents with   Seizures    Alison Cummings is a 23 y.o. female.  HPI 23 year old female with a history of hypothyroidism, ADHD, anxiety, depression, presents from behavioral health with syncope and possible seizure.  History is initially from EMS.  The patient passed out in the bathroom and may have had seizure-like activity.  She was stable and route to the hospital.  Patient tells me that she has been put on Lexapro  since getting to behavioral health and that that has previously caused problems including tremors for her.  She thinks this might be the cause of why she felt bad today.  Patient states that after breakfast she started feeling dizzy.  The dizziness progressed as she was standing in line for meds for a while.  Eventually she sat down waiting on her meds but then went to the bathroom.  While in the bathroom the dizziness continued to get worse.  She hit her head on the wall and ultimately passed out.  The patient states she has been having some right-sided chest pain and shortness of breath today.  She has been having a cough and congestion for a couple weeks.  No fevers today.  Patient is having a moderate left-sided headache where she hit her head.  No neck pain.  The sitter who is at the patient's bedside states that she witnessed the episode in the bathroom.  The patient passed out and hit her head and then may have had some brief twitching but not seizure-like activity.  Home Medications Prior to Admission medications   Medication Sig Start Date End Date Taking? Authorizing Provider  escitalopram  (LEXAPRO ) 10 MG tablet Take 1 tablet (10 mg total) by mouth daily for 14 days. 08/30/23 05/04/24  Bobbitt, Shalon E, NP  hydrOXYzine  (ATARAX ) 25 MG tablet Take 1 tablet (25 mg total) by mouth every 6 (six) hours as  needed for itching or anxiety. 11/26/23   Prosperi, Christian H, PA-C  methimazole  (TAPAZOLE ) 10 MG tablet Take 1 tablet (10 mg total) by mouth 2 (two) times daily. 11/26/23   Prosperi, Christian H, PA-C  metoprolol  succinate (TOPROL -XL) 50 MG 24 hr tablet Take 1 tablet (50 mg total) by mouth daily. 11/26/23   Prosperi, Christian H, PA-C  risperiDONE  (RISPERDAL ) 0.25 MG tablet Take 0.25 mg by mouth in the morning and at bedtime. 11/09/23   [provider]      Allergies    Apple, Cherry, Mangifera indica, and Ibuprofen     Review of Systems   Review of Systems  Constitutional:  Negative for fever.  Respiratory:  Positive for cough and shortness of breath.   Cardiovascular:  Positive for chest pain.  Gastrointestinal:  Negative for abdominal pain.  Musculoskeletal:  Negative for neck pain.  Neurological:  Positive for dizziness, syncope and headaches.    Physical Exam Updated Vital Signs BP (!) 131/106   Pulse (!) 138   Temp 98.2 F (36.8 C) (Oral)   Resp 20   Ht 5\' 2"  (1.575 m)   Wt 90 kg   LMP 12/19/2023 (Approximate)   SpO2 99%   BMI 36.29 kg/m  Physical Exam Vitals and nursing note reviewed.  Constitutional:      Appearance: She is well-developed.     Interventions: Cervical collar in place.  HENT:  Head: Normocephalic.      Mouth/Throat:     Mouth: Mucous membranes are dry.     Comments: No tongue injury Eyes:     Extraocular Movements: Extraocular movements intact.     Pupils: Pupils are equal, round, and reactive to light.  Cardiovascular:     Rate and Rhythm: Regular rhythm. Tachycardia present.     Heart sounds: Normal heart sounds.  Pulmonary:     Effort: Pulmonary effort is normal.     Breath sounds: Normal breath sounds.  Abdominal:     Palpations: Abdomen is soft.     Tenderness: There is no abdominal tenderness.  Musculoskeletal:     Cervical back: Normal range of motion. No spinous process tenderness or muscular tenderness.  Skin:     General: Skin is warm and dry.  Neurological:     Mental Status: She is alert and oriented to person, place, and time.     Comments: CN 3-12 grossly intact. 5/5 strength in all 4 extremities. Grossly normal sensation. Normal finger to nose.      ED Results / Procedures / Treatments   Labs (all labs ordered are listed, but only abnormal results are displayed) Labs Reviewed  LIPID PANEL  HEMOGLOBIN A1C  MAGNESIUM   TSH  T4, FREE  COMPREHENSIVE METABOLIC PANEL WITH GFR  D-DIMER, QUANTITATIVE  CBC WITH DIFFERENTIAL/PLATELET  HCG, QUANTITATIVE, PREGNANCY  TROPONIN I (HIGH SENSITIVITY)    EKG None  Radiology No results found.  Procedures Procedures    Medications Ordered in ED Medications  acetaminophen  (TYLENOL ) tablet 650 mg (has no administration in time range)  alum & mag hydroxide-simeth (MAALOX/MYLANTA) 200-200-20 MG/5ML suspension 30 mL (has no administration in time range)  magnesium  hydroxide (MILK OF MAGNESIA) suspension 30 mL (has no administration in time range)  OLANZapine zydis (ZYPREXA) disintegrating tablet 5 mg (5 mg Oral Given 01/05/24 2119)  OLANZapine (ZYPREXA) injection 5 mg (has no administration in time range)  OLANZapine (ZYPREXA) injection 10 mg (has no administration in time range)  metoprolol  succinate (TOPROL -XL) 24 hr tablet 50 mg (50 mg Oral Given 01/08/24 0749)  methimazole  (TAPAZOLE ) tablet 10 mg (10 mg Oral Given 01/08/24 0746)  hydrOXYzine  (ATARAX ) tablet 25 mg (25 mg Oral Given 01/06/24 2137)  escitalopram  (LEXAPRO ) tablet 10 mg (10 mg Oral Patient Refused/Not Given 01/07/24 2118)  ARIPiprazole  (ABILIFY ) tablet 2 mg (2 mg Oral Given 01/05/24 1654)    Followed by  ARIPiprazole  (ABILIFY ) tablet 5 mg (has no administration in time range)  LORazepam  (ATIVAN ) 2 MG/ML injection (has no administration in time range)  LORazepam  (ATIVAN ) tablet 2 mg ( Oral See Alternative 01/08/24 5784)    Or  LORazepam  (ATIVAN ) injection 2 mg (2 mg Intramuscular Incomplete  01/08/24 0823)  lactated ringers bolus 1,000 mL (has no administration in time range)  acetaminophen  (TYLENOL ) tablet 650 mg (has no administration in time range)    ED Course/ Medical Decision Making/ A&P Clinical Course as of 01/08/24 0931  Thu Jan 08, 2024  0930 Patient is resting comfortably and while she does have a headache, she does not seem particularly distracted.  She has no neck pain, does have full range of motion in her neck and so I think it is unlikely that she has a cervical spine injury.  C-collar removed and she has no neurodeficits or pain with range of motion of neck. [SG]    Clinical Course User Index [SG] Jerilynn Montenegro, MD  Medical Decision Making Amount and/or Complexity of Data Reviewed Labs: ordered.    Details: Elevated T4 Radiology: ordered and independent interpretation performed.    Details: No head bleed ECG/medicine tests: ordered and independent interpretation performed.    Details: Sinus Tachycardia  Risk OTC drugs. Prescription drug management. Decision regarding hospitalization.   Presents with what sounds like syncope at behavioral health.  I do not think it was a true seizure.  However given her head injury CT head was obtained and is unremarkable.  She has remained persistently tachycardic.  D-dimer is negative and I otherwise have lower suspicion for PE.  I think this is all related to her thyroid  disorder.  Pharmacy indicates that she has been intermittently in and out of her thyroid  medicine so this is probably more of a compliance issue.  She has been on it the last few days however and has had some soft blood pressures here so we will give little metoprolol  but also she will need admission for better control of her hyperthyroidism.  She is not distress or altered.  Discussed with Dr. Sulema Endo for admission.        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

## 2024-01-08 NOTE — ED Notes (Signed)
 Sitter at bedside.
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
## 2024-01-08 NOTE — ED Notes (Signed)
 MHT endorses she is to go back to [facility] per [facility] there. Consulting civil engineer notified. Sitter order placed

## 2024-01-08 NOTE — ED Notes (Signed)
 Patient returns from CT, MHT at bedside

## 2024-01-09 ENCOUNTER — Other Ambulatory Visit: Payer: Self-pay

## 2024-01-09 ENCOUNTER — Inpatient Hospital Stay (HOSPITAL_COMMUNITY)

## 2024-01-09 ENCOUNTER — Encounter (HOSPITAL_COMMUNITY): Payer: Self-pay | Admitting: Hospitalist

## 2024-01-09 DIAGNOSIS — E0590 Thyrotoxicosis, unspecified without thyrotoxic crisis or storm: Secondary | ICD-10-CM

## 2024-01-09 DIAGNOSIS — R55 Syncope and collapse: Secondary | ICD-10-CM

## 2024-01-09 DIAGNOSIS — F329 Major depressive disorder, single episode, unspecified: Secondary | ICD-10-CM

## 2024-01-09 LAB — CBC
HCT: 38.6 % (ref 36.0–46.0)
Hemoglobin: 12.4 g/dL (ref 12.0–15.0)
MCH: 23.7 pg — ABNORMAL LOW (ref 26.0–34.0)
MCHC: 32.1 g/dL (ref 30.0–36.0)
MCV: 73.8 fL — ABNORMAL LOW (ref 80.0–100.0)
Platelets: 192 10*3/uL (ref 150–400)
RBC: 5.23 MIL/uL — ABNORMAL HIGH (ref 3.87–5.11)
RDW: 15.8 % — ABNORMAL HIGH (ref 11.5–15.5)
WBC: 5 10*3/uL (ref 4.0–10.5)
nRBC: 0 % (ref 0.0–0.2)

## 2024-01-09 LAB — BASIC METABOLIC PANEL WITH GFR
Anion gap: 9 (ref 5–15)
BUN: 10 mg/dL (ref 6–20)
CO2: 24 mmol/L (ref 22–32)
Calcium: 9.7 mg/dL (ref 8.9–10.3)
Chloride: 105 mmol/L (ref 98–111)
Creatinine, Ser: 0.44 mg/dL (ref 0.44–1.00)
GFR, Estimated: >60 mL/min (ref >60)
Glucose, Bld: 87 mg/dL (ref 70–99)
Potassium: 3.9 mmol/L (ref 3.5–5.1)
Sodium: 138 mmol/L (ref 135–145)

## 2024-01-09 LAB — HIV ANTIBODY (ROUTINE TESTING W REFLEX): HIV Screen 4th Generation wRfx: NONREACTIVE

## 2024-01-09 MED ORDER — ARIPIPRAZOLE 10 MG PO TABS
5.0000 mg | ORAL_TABLET | Freq: Every day | ORAL | Status: DC
  Administered 2024-01-09 – 2024-01-10 (×2): 5 mg via ORAL
  Filled 2024-01-09 (×2): qty 1

## 2024-01-09 MED ORDER — HYDROXYZINE HCL 10 MG PO TABS
10.0000 mg | ORAL_TABLET | Freq: Once | ORAL | Status: DC
  Filled 2024-01-09: qty 1

## 2024-01-09 MED ORDER — METHIMAZOLE 10 MG PO TABS
30.0000 mg | ORAL_TABLET | Freq: Every day | ORAL | Status: DC
  Administered 2024-01-09 – 2024-01-10 (×2): 30 mg via ORAL
  Filled 2024-01-09 (×2): qty 3

## 2024-01-09 MED ORDER — PROPRANOLOL HCL 20 MG PO TABS
20.0000 mg | ORAL_TABLET | Freq: Three times a day (TID) | ORAL | Status: DC
  Administered 2024-01-09 – 2024-01-10 (×4): 20 mg via ORAL
  Filled 2024-01-09 (×4): qty 1

## 2024-01-09 MED ORDER — ONDANSETRON HCL 4 MG/2ML IJ SOLN: 4.0000 mg | Freq: Four times a day (QID) | INTRAMUSCULAR | Status: DC | PRN

## 2024-01-09 MED ORDER — ESCITALOPRAM OXALATE 10 MG PO TABS
10.0000 mg | ORAL_TABLET | Freq: Every day | ORAL | Status: DC
  Administered 2024-01-09 – 2024-01-10 (×2): 10 mg via ORAL
  Filled 2024-01-09 (×2): qty 1

## 2024-01-09 MED ORDER — ACETAMINOPHEN 325 MG PO TABS: 650.0000 mg | ORAL_TABLET | Freq: Four times a day (QID) | ORAL | Status: DC | PRN

## 2024-01-09 NOTE — Progress Notes (Signed)
 Rec report from ED RN Walda Guiles), states pt has sitter and was tx from [facility]. Pt arrived to unit aprox 2000, ambulated independently in room. Pt requesting to shower, but nurse declines and redirected to wash up at sink d/t note r/t pt found down around 8am at [facility] w either seizure activity or syncope. Pt did not have order for sitter, so I reached out to covering provider and [facility]. Pt sitter set tp leave @ 0300. Nurse performed CSSRS score (provider request) on pt and score was 'low risk', covering provider states she blocked from ordering SI sitter d/t low risk score, so she ordered a Recruitment consultant. Pt requesting atarax  to help w anxiety, unable to admin d/t meds 'read only', I called pharmacy and notified covering provider that meds are unavailable to patient.  Pt states she felt the meds were helping and as of Thur morn she no longer had SI. Pt rested most of night, denies pain, nausea. environment safe.

## 2024-01-09 NOTE — Progress Notes (Signed)
 The off going nurse reported that patient has been tachy since 5 am, she repeated vitals at 6 am following the yellow MEWs protocol. Dr. Jonelle Neri and charge RN has been notified.

## 2024-01-09 NOTE — TOC Initial Note (Signed)
 Transition of Care [facility]) - Initial/Assessment Note    Patient Details  Name: Alison Cummings MRN: [id number] Date of Birth: March 01, 2001  Transition of Care [facility]) CM/SW Contact:    Jannice Mends, LCSW Phone Number: 01/09/2024, 8:31 AM  Clinical Narrative:                 Patient admitted from [facility] for seizure-like activity and was homeless prior to that. TOC will continue to follow for needs.   Expected Discharge Plan: Psychiatric Hospital Barriers to Discharge: Continued Medical Work up, Psych Bed not available   Patient Goals and CMS Choice Patient states their goals for this hospitalization and ongoing recovery are:: Return to [facility]          Expected Discharge Plan and Services       Living arrangements for the past 2 months: Homeless, Homeless Shelter                                      Prior Living Arrangements/Services Living arrangements for the past 2 months: Homeless, Homeless Shelter Lives with:: Self Patient language and need for interpreter reviewed:: Yes Do you feel safe going back to the place where you live?: No   homeless  Need for Family Participation in Patient Care: No (Comment) Care giver support system in place?: No (comment)   Criminal Activity/Legal Involvement Pertinent to Current Situation/Hospitalization: No - Comment as needed  Activities of Daily Living   ADL Screening (condition at time of admission) Independently performs ADLs?: Yes (appropriate for developmental age) Is the patient deaf or have difficulty hearing?: No Does the patient have difficulty seeing, even when wearing glasses/contacts?: No Does the patient have difficulty concentrating, remembering, or making decisions?: No  Permission Sought/Granted                  Emotional Assessment Appearance:: Appears stated age     Orientation: : Oriented to Self, Oriented to  Time, Oriented to Situation, Oriented to Place Alcohol / Substance Use: Not  Applicable Psych Involvement: Yes (comment)  Admission diagnosis:  Hyperthyroidism [E05.90] Patient Active Problem List   Diagnosis Date Noted   PTSD (post-traumatic stress disorder) 01/05/2024   MDD (major depressive disorder), recurrent severe, without psychosis (HCC) 01/04/2024   Need for influenza vaccination 06/11/2023   Hyperthyroidism 06/11/2023   High blood pressure 06/11/2023   Tachycardia 06/11/2023   Loss of consciousness (HCC) 06/11/2023   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   ADHD (attention deficit hyperactivity disorder), combined type 08/31/2013   Episodic mood disorder (HCC) 08/31/2013   PCP:  Patient, No Pcp Per Pharmacy:   [id number] [place], [place] - [address] AT [address] [place] [address] Phone: [phone] Fax: [phone]     Social Drivers of Health (SDOH) Social History: SDOH Screenings   Food Insecurity: Food Insecurity Present (01/08/2024)  Housing: High Risk (01/08/2024)  Transportation Needs: No Transportation Needs (01/08/2024)  Utilities: Not At Risk (01/08/2024)  Alcohol Screen: Low Risk  (01/04/2024)  Depression (PHQ2-9): Medium Risk (06/11/2023)  Financial Resource Strain: Low Risk  (10/22/2023)   Received from [facility]  Physical Activity: Not on File (11/22/2021)   Received from [place], [place]  Social Connections: Not on File (04/19/2023)   Received from [facility]  Stress: Not on File (11/22/2021)   Received from [place], [place]  Tobacco Use:  Medium Risk (01/09/2024)   SDOH Interventions:     Readmission Risk Interventions     No data to display

## 2024-01-09 NOTE — Progress Notes (Signed)
 Alison Cummings  UXL:[id number] DOB: [birth year] DOA: 01/08/2024 PCP: Patient, No Pcp Per    Brief Narrative:  23 year old with a history of hypothyroidism, bipolar disorder, ADHD, and prior self-injurious behavior who was admitted to behavioral health 6/2 with suicidal ideation but then developed lightheadedness and dizziness and was sent to the [facility] ER for evaluation.  In the ER she was found to have a TSH of 0.01 and a free T4 of 4.99.  She was dosed with metoprolol  and Tapazole  while in the ER and admitted by the medicine service.  Goals of Care:   Code Status: Full Code   DVT prophylaxis: enoxaparin (LOVENOX) injection 40 mg Start: 01/08/24 2200   Interim Hx: Afebrile since admission.  Mild persistent sinus tachycardia with heart rate 109-116.  Vital signs stable otherwise.  Alert and conversant at the time of visit.  No sore throat or hoarseness.  No chest pain or shortness of breath.  Assessment & Plan:  Syncopal spell Patient was found down in the bathroom of her behavioral health room - she remembered feeling dizzy while in the bathroom (after standing to wash her hands) and then woke up with people around her - D-dimer not significantly elevated -CT head with no acute findings -is not anemic - TTE pending   Thyrotoxicosis / hyperthyroidism TSH undetectable (and has been for ~9 months) with elevated free T4 at 4.99 -appears to date back as far as September 2024 - has not been compliant with her Tapazole  as an outpatient -counseled on need for strict compliance with Tapazole  -Tapazole  resumed -no clinical evidence of thyroid  storm at present  Positive pregnancy tests OB evaluated the patient on 6/4 and suggested that her positive pregnancy test is explained by her history of ectopic pregnancy and methotrexate injections -the recommendation was made that she follow-up with her outpatient OB/GYN after discharge to have repeat blood test in 1-2 weeks   Family Communication: No  family present at time of exam Disposition: Anticipate medical stability for discharge in approximately 24-48 hours   Objective: Blood pressure 114/75, pulse (!) 109, temperature 98.8 F (37.1 C), temperature source Oral, resp. rate 17, last menstrual period 12/19/2023, SpO2 99%, unknown if currently breastfeeding.  Intake/Output Summary (Last 24 hours) at 01/09/2024 1644 Last data filed at 01/09/2024 1337 Gross per 24 hour  Intake 1080 ml  Output --  Net 1080 ml   There were no vitals filed for this visit.  Examination: General: No acute respiratory distress Lungs: Clear to auscultation bilaterally without wheezes or crackles Cardiovascular: Regular rate and rhythm without murmur gallop or rub normal S1 and S2 Abdomen: Nontender, nondistended, soft, bowel sounds positive, no rebound, no ascites, no appreciable mass Extremities: No significant cyanosis, clubbing, or edema bilateral lower extremities  CBC: Recent Labs  Lab 01/04/24 0517 01/08/24 0925 01/09/24 0634  WBC 5.3 5.6 5.0  NEUTROABS  --  2.1  --   HGB 12.2 12.2 12.4  HCT 38.3 38.7 38.6  MCV 74.4* 74.1* 73.8*  PLT 138* 206 192   Basic Metabolic Panel: Recent Labs  Lab 01/04/24 0517 01/08/24 0925 01/09/24 0634  NA 138 138 138  K 3.7 4.2 3.9  CL 105 105 105  CO2 23 26 24   GLUCOSE 91 96 87  BUN 16 15 10   CREATININE 0.61 0.74 0.44  CALCIUM 9.2 9.3 9.7  MG  --  1.7  --    GFR: Estimated Creatinine Clearance: 116.1 mL/min (by C-G formula based on SCr of 0.44  mg/dL).   Scheduled Meds:  ARIPiprazole   5 mg Oral Daily   enoxaparin (LOVENOX) injection  40 mg Subcutaneous Q24H   escitalopram   10 mg Oral Daily   methIMAzole   30 mg Oral Daily   propranolol  20 mg Oral TID     LOS: 1 day   Abbe Abate, MD [facility] Office  [phone] Pager - Text Page per T[place]ford Foley  If 7PM-7AM, please contact night-coverage per Amion 01/09/2024, 4:44 PM

## 2024-01-09 NOTE — Progress Notes (Signed)
 Echocardiogram 2D Echocardiogram has been performed.  Alison Cummings 01/09/2024, 5:43 PM
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
## 2024-01-09 NOTE — Plan of Care (Signed)
  Problem: Coping: Goal: Level of anxiety will decrease Outcome: Progressing   Problem: Pain Managment: Goal: General experience of comfort will improve and/or be controlled Outcome: Progressing   Problem: Safety: Goal: Ability to remain free from injury will improve Outcome: Progressing   Problem: Skin Integrity: Goal: Risk for impaired skin integrity will decrease Outcome: Progressing

## 2024-01-09 NOTE — Significant Event (Signed)
       CROSS COVER NOTE  NAME: Alison Cummings MRN: [id number] DOB : 2001/01/29 ATTENDING PHYSICIAN: Arne Langdon, MD    Date of Service   01/09/2024   HPI/Events of Note   Patient admitted from [facility] where she was being treated for suicidal ideation. Time of discharge where suicide risk was minimal. However patient had a sitter in place.  Psych not consulted with admission to medical team Sitter order expires 3am  Interventions   Assessment/Plan: Continue sitter until further more through determination of need can be completed in am     Kip Peon NP [facility] Cross Cover 7pm-7am - check amion for availability Pager [phone]

## 2024-01-10 ENCOUNTER — Other Ambulatory Visit: Payer: Self-pay

## 2024-01-10 ENCOUNTER — Inpatient Hospital Stay (HOSPITAL_COMMUNITY)
Admission: AD | Admit: 2024-01-10 | Discharge: 2024-01-13 | DRG: 885 | Disposition: A | Discharge status: Home / Self Care | Source: Intra-hospital | Attending: Psychiatry | Admitting: Psychiatry

## 2024-01-10 ENCOUNTER — Encounter (HOSPITAL_COMMUNITY): Payer: Self-pay | Admitting: Psychiatry

## 2024-01-10 DIAGNOSIS — Z886 Allergy status to analgesic agent status: Secondary | ICD-10-CM

## 2024-01-10 DIAGNOSIS — Z87891 Personal history of nicotine dependence: Secondary | ICD-10-CM | POA: Diagnosis not present

## 2024-01-10 DIAGNOSIS — R45851 Suicidal ideations: Secondary | ICD-10-CM | POA: Diagnosis not present

## 2024-01-10 DIAGNOSIS — F332 Major depressive disorder, recurrent severe without psychotic features: Secondary | ICD-10-CM | POA: Diagnosis not present

## 2024-01-10 DIAGNOSIS — I10 Essential (primary) hypertension: Secondary | ICD-10-CM | POA: Diagnosis not present

## 2024-01-10 DIAGNOSIS — Z91410 Personal history of adult physical and sexual abuse: Secondary | ICD-10-CM

## 2024-01-10 DIAGNOSIS — Z9152 Personal history of nonsuicidal self-harm: Secondary | ICD-10-CM | POA: Diagnosis not present

## 2024-01-10 DIAGNOSIS — Z79899 Other long term (current) drug therapy: Secondary | ICD-10-CM

## 2024-01-10 DIAGNOSIS — Z823 Family history of stroke: Secondary | ICD-10-CM | POA: Diagnosis not present

## 2024-01-10 DIAGNOSIS — F902 Attention-deficit hyperactivity disorder, combined type: Secondary | ICD-10-CM | POA: Diagnosis present

## 2024-01-10 DIAGNOSIS — Z833 Family history of diabetes mellitus: Secondary | ICD-10-CM | POA: Diagnosis not present

## 2024-01-10 DIAGNOSIS — Z5941 Food insecurity: Secondary | ICD-10-CM

## 2024-01-10 DIAGNOSIS — Z818 Family history of other mental and behavioral disorders: Secondary | ICD-10-CM

## 2024-01-10 DIAGNOSIS — Z5900 Homelessness unspecified: Secondary | ICD-10-CM

## 2024-01-10 DIAGNOSIS — Z83438 Family history of other disorder of lipoprotein metabolism and other lipidemia: Secondary | ICD-10-CM | POA: Diagnosis not present

## 2024-01-10 DIAGNOSIS — Z91018 Allergy to other foods: Secondary | ICD-10-CM

## 2024-01-10 DIAGNOSIS — F411 Generalized anxiety disorder: Secondary | ICD-10-CM | POA: Diagnosis present

## 2024-01-10 DIAGNOSIS — Z6836 Body mass index (BMI) 36.0-36.9, adult: Secondary | ICD-10-CM

## 2024-01-10 DIAGNOSIS — E669 Obesity, unspecified: Secondary | ICD-10-CM | POA: Diagnosis not present

## 2024-01-10 DIAGNOSIS — E0590 Thyrotoxicosis, unspecified without thyrotoxic crisis or storm: Secondary | ICD-10-CM | POA: Diagnosis present

## 2024-01-10 LAB — ECHOCARDIOGRAM COMPLETE
AR max vel: 3.08 cm2
AV Peak grad: 8.8 mmHg
Ao pk vel: 1.48 m/s
Area-P 1/2: 4.86 cm2
Height: 62 in
S' Lateral: 3.5 cm
Weight: 3174.62 [oz_av]

## 2024-01-10 LAB — GLUCOSE, CAPILLARY: Glucose-Capillary: 130 mg/dL — ABNORMAL HIGH (ref 70–99)

## 2024-01-10 MED ORDER — ESCITALOPRAM OXALATE 10 MG PO TABS
10.0000 mg | ORAL_TABLET | Freq: Every day | ORAL | Status: DC
  Administered 2024-01-11 – 2024-01-13 (×3): 10 mg via ORAL
  Filled 2024-01-10 (×3): qty 1

## 2024-01-10 MED ORDER — OLANZAPINE 10 MG IM SOLR: 10.0000 mg | Freq: Two times a day (BID) | INTRAMUSCULAR | Status: DC | PRN

## 2024-01-10 MED ORDER — ARIPIPRAZOLE 5 MG PO TABS: 5.0000 mg | ORAL_TABLET | Freq: Every day | ORAL | Status: DC

## 2024-01-10 MED ORDER — OLANZAPINE 10 MG PO TABS: 10.0000 mg | ORAL_TABLET | Freq: Two times a day (BID) | ORAL | Status: DC | PRN

## 2024-01-10 MED ORDER — ESCITALOPRAM OXALATE 10 MG PO TABS: 10.0000 mg | ORAL_TABLET | Freq: Every day | ORAL | Status: DC

## 2024-01-10 MED ORDER — METHIMAZOLE 10 MG PO TABS: 20.0000 mg | ORAL_TABLET | Freq: Two times a day (BID) | ORAL | Status: DC

## 2024-01-10 MED ORDER — ACETAMINOPHEN 325 MG PO TABS: 650.0000 mg | ORAL_TABLET | Freq: Four times a day (QID) | ORAL | Status: DC | PRN

## 2024-01-10 MED ORDER — MAGNESIUM HYDROXIDE 400 MG/5ML PO SUSP
30.0000 mL | Freq: Every day | ORAL | Status: DC | PRN
  Administered 2024-01-11: 30 mL via ORAL
  Filled 2024-01-10 (×2): qty 30

## 2024-01-10 MED ORDER — ARIPIPRAZOLE 5 MG PO TABS
5.0000 mg | ORAL_TABLET | Freq: Every day | ORAL | Status: DC
  Administered 2024-01-11 – 2024-01-13 (×3): 5 mg via ORAL
  Filled 2024-01-10 (×3): qty 1

## 2024-01-10 MED ORDER — TRAZODONE HCL 50 MG PO TABS
50.0000 mg | ORAL_TABLET | Freq: Every evening | ORAL | Status: DC | PRN
  Filled 2024-01-10: qty 1

## 2024-01-10 MED ORDER — ATENOLOL 25 MG PO TABS
50.0000 mg | ORAL_TABLET | Freq: Every day | ORAL | Status: DC
  Administered 2024-01-10: 50 mg via ORAL
  Filled 2024-01-10: qty 2

## 2024-01-10 MED ORDER — METHIMAZOLE 10 MG PO TABS
20.0000 mg | ORAL_TABLET | Freq: Two times a day (BID) | ORAL | Status: DC
  Filled 2024-01-10: qty 2

## 2024-01-10 MED ORDER — METHIMAZOLE 10 MG PO TABS
20.0000 mg | ORAL_TABLET | Freq: Two times a day (BID) | ORAL | Status: DC
  Administered 2024-01-11 (×2): 20 mg via ORAL
  Filled 2024-01-10 (×3): qty 2
  Filled 2024-01-10: qty 4
  Filled 2024-01-10: qty 2

## 2024-01-10 MED ORDER — ATENOLOL 50 MG PO TABS
50.0000 mg | ORAL_TABLET | Freq: Every day | ORAL | Status: DC
  Administered 2024-01-11 – 2024-01-13 (×3): 50 mg via ORAL
  Filled 2024-01-10 (×3): qty 2

## 2024-01-10 MED ORDER — ATENOLOL 50 MG PO TABS: 50.0000 mg | ORAL_TABLET | Freq: Every day | ORAL | Status: DC

## 2024-01-10 MED ORDER — HYDROXYZINE HCL 25 MG PO TABS
25.0000 mg | ORAL_TABLET | Freq: Four times a day (QID) | ORAL | Status: DC | PRN
  Administered 2024-01-10 – 2024-01-12 (×3): 25 mg via ORAL
  Filled 2024-01-10 (×3): qty 1

## 2024-01-10 MED ORDER — ALUM & MAG HYDROXIDE-SIMETH 200-200-20 MG/5ML PO SUSP: 30.0000 mL | ORAL | Status: DC | PRN

## 2024-01-10 NOTE — Plan of Care (Signed)

## 2024-01-10 NOTE — TOC Transition Note (Signed)
 Transition of Care [facility]) - Discharge Note   Patient Details  Name: Alison Cummings MRN: [id number] Date of Birth: 05-30-2001  Transition of [facility]) CM/SW Contact:  Maya Sparrow, LCSW Phone Number: 01/10/2024, 4:01 PM   Clinical Narrative:    Patient will DC to: [facility] Anticipated DC date: 01/10/2024 Transport by: Safe Transport   Per MD patient ready for DC to [facility]. RN to call report to [phone] room 306-2.  CSW met with patient at bedside and patient signed voluntary admission form.  The form has been faxed to [facility].  Safe transport scheduled for 4:30pm.  CSW will sign off for now as social work intervention is no longer needed. Please consult us  again if new needs arise.    Final next level of care: Psychiatric Hospital Barriers to Discharge: Barriers Resolved   Patient Goals and CMS Choice Patient states their goals for this hospitalization and ongoing recovery are:: Return to [facility]          Discharge Placement                Patient to be transferred to facility by: Safe Transport Name of family member notified: patient alert and oriented Patient and family notified of of transfer: 01/10/24  Discharge Plan and Services Additional resources added to the After Visit Summary for                                       Social Drivers of Health (SDOH) Interventions SDOH Screenings   Food Insecurity: Food Insecurity Present (01/08/2024)  Housing: High Risk (01/08/2024)  Transportation Needs: No Transportation Needs (01/08/2024)  Utilities: Not At Risk (01/08/2024)  Alcohol Screen: Low Risk  (01/04/2024)  Depression (PHQ2-9): Medium Risk (06/11/2023)  Financial Resource Strain: Low Risk  (10/22/2023)   Received from [facility]  Physical Activity: Not on File (11/22/2021)   Received from [place], [place]  Social Connections: Not on File (04/19/2023)   Received from [facility]  Stress: Not on File (11/22/2021)   Received from [place], [place]  Tobacco Use: Medium Risk  (01/09/2024)     Readmission Risk Interventions     No data to display

## 2024-01-10 NOTE — Progress Notes (Signed)
   01/10/24 2229  Psych Admission Type (Psych Patients Only)  Admission Status Voluntary  Psychosocial Assessment  Patient Complaints Anxiety  Eye Contact Fair  Facial Expression Animated  Affect Appropriate to circumstance  Speech Logical/coherent  Interaction Assertive  Motor Activity Fidgety  Appearance/Hygiene In scrubs  Behavior Characteristics Fidgety;Anxious  Mood Anxious;Pleasant  Thought Process  Coherency WDL  Content WDL  Delusions None reported or observed  Perception WDL  Hallucination None reported or observed  Judgment Limited  Confusion None  Danger to Self  Current suicidal ideation? Denies  Agreement Not to Harm Self Yes  Description of Agreement verbal  Danger to Others  Danger to Others None reported or observed

## 2024-01-10 NOTE — Progress Notes (Signed)
 Report was given to Melissa, RN at behavioral health hospital. IV removed, tolerated well. Patient is calm, cooperative, bed in lowest position and call light within reach.

## 2024-01-10 NOTE — [facility] Group Notes (Signed)
 [facility] Group Notes:  (Nursing/MHT/Case Management/Adjunct)  Date:  01/10/2024  Time:  2000  Type of Therapy:  Wrap up group  Participation Level:  Active  Participation Quality:  Appropriate, Attentive, Sharing, and Supportive  Affect:  Anxious  Cognitive:  Alert  Insight:  Improving  Engagement in Group:  Engaged  Modes of Intervention:  Clarification, Education, and Socialization  Summary of Progress/Problems: Positive thinking and self-care were discussed.   Catharine Clock 01/10/2024, 9:26 PM

## 2024-01-10 NOTE — Progress Notes (Signed)
 Patient ID: ECE CUMBERLAND, female   DOB: 2001/01/27, 23 y.o.   MRN: [id number]  Patient readmitted from [facility]. Patient originally at [facility] but had a syncope event resulting in a fall and was transferred for med clearance. Patient denies SI/HI/AVH and pain. Patient is unable to identify any goals for admission. Patient reoriented to unit and joined peers for dinner. No distress noted. Safety checks initiated. Patient remains safe at this time.

## 2024-01-10 NOTE — Plan of Care (Signed)
  Problem: Education: Goal: Utilization of techniques to improve thought processes will improve Outcome: Progressing Goal: Knowledge of the prescribed therapeutic regimen will improve Outcome: Progressing   Problem: Activity: Goal: Interest or engagement in leisure activities will improve Outcome: Progressing   
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
## 2024-01-10 NOTE — Discharge Summary (Signed)
 DISCHARGE SUMMARY  EMMER LILLIBRIDGE  MR#: [id number]  DOB:2001/05/12  Date of Admission: 01/08/2024 Date of Discharge: 01/10/2024  Attending Physician:Peony Barner Constantine Delude, MD  Patient's VHQ:[id number], No Pcp Per  Disposition: D/C back to Behavioral Health for voluntary commitment for tx of depression w/ SI  Follow-up Appts:  Follow-up Information     Your Primary Care Provider Follow up.   Why: You need to schedule a visit with your Primary Care Provider within 7 days of your discharge from [facility] for a check of your thyroid  to determine if you medications need to be further adjusted.                Tests Needing Follow-up: -Repeat TSH will be indicated in 7-10 days in outpatient setting -Question patient on compliance with Tapazole  and atenolol   Discharge Diagnoses: Syncopal spell Graves' disease -hypothyroidism Positive pregnancy test Suicidal ideation -depression  Initial presentation: 23 year old with a history of Graves' disease/hyperthyroidism, bipolar disorder, ADHD, and prior self-injurious behavior who was admitted to [facility] 6/2 with suicidal ideation but then developed lightheadedness and dizziness, suffered a syncopal spell, and was sent to the [place] ER for evaluation. In the ER she was found to have a TSH of 0.01 and a free T4 of 4.99. She was dosed with metoprolol  and Tapazole  while in the ER and admitted by the medicine service.  During her admission she admitted to noncompliance with her Tapazole  and beta-blocker therapy.  Hospital Course:  Syncopal spell Patient was found down in the bathroom of her Behavioral Health room - she remembered feeling dizzy while in the bathroom (after standing to wash her hands) and then woke up with people around her - D-dimer not significantly elevated -CT head with no acute findings -is not anemic - TTE without acute abnormalities noted on preliminary read -patient remains asymptomatic -no further  workup indicated -etiology of syncope unclear but felt to have likely been benign   Thyrotoxicosis / hyperthyroidism TSH undetectable (and has been for ~9 months) with elevated free T4 at 4.99 -appears to date back as far as September 2024 -she reports that she has had a prior diagnosis of Graves' disease - has not been compliant with her Tapazole  or beta-blocker as an outpatient -counseled on need for strict compliance with Tapazole  and beta-blocker -Tapazole  and beta-blocker resumed - no clinical evidence of thyroid  storm - would likely do best w/ definitive tx via radioiodine tx or surgery, if she can comply w/ Tapazole  long enough to get her TSH under control -follow-up with TSH and questioning on compliance with medical therapy will be indicated in the outpatient setting - if her tachycardia persists her atenolol  dose could be increased to 100 mg daily in 2-3 days   Positive pregnancy tests OB evaluated the patient on 6/4 and suggested that her positive pregnancy test is explained by her history of ectopic pregnancy and methotrexate injections -the recommendation was made that she follow-up with her outpatient OB/GYN after discharge to have repeat blood test in 1-2 weeks  Allergies as of 01/10/2024       Reactions   Apple Itching, Swelling, Other (See Comments)   Tongue swells and throat itches   Cherry Itching, Swelling, Other (See Comments)   Tongue swells and throat itches   Mangifera Indica    Ibuprofen  Palpitations, Swelling   Of feet        Medication List     STOP taking these medications    metoprolol  succinate 50 MG  24 hr tablet Commonly known as: TOPROL -XL       TAKE these medications    acetaminophen  325 MG tablet Commonly known as: TYLENOL  Take 2 tablets (650 mg total) by mouth every 6 (six) hours as needed for mild pain (pain score 1-3), fever or headache.   ARIPiprazole  5 MG tablet Commonly known as: ABILIFY  Take 1 tablet (5 mg total) by mouth daily. Start  taking on: January 11, 2024 What changed: when to take this   atenolol  50 MG tablet Commonly known as: TENORMIN  Take 1 tablet (50 mg total) by mouth daily. Start taking on: January 11, 2024   escitalopram  10 MG tablet Commonly known as: LEXAPRO  Take 1 tablet (10 mg total) by mouth daily. Start taking on: January 11, 2024 What changed: when to take this   methimazole  10 MG tablet Commonly known as: TAPAZOLE  Take 2 tablets (20 mg total) by mouth 2 (two) times daily. What changed: how much to take        Day of Discharge BP 119/60   Pulse (!) 107   Temp 98.5 F (36.9 C) (Oral)   Resp 18   LMP 12/19/2023 (Approximate)   SpO2 99%   Physical Exam: General: No acute respiratory distress Lungs: Clear to auscultation bilaterally without wheezes or crackles Cardiovascular: Tachycardic at approximate 105 bpm but regular without murmur or rub Abdomen: Nontender, nondistended, soft, bowel sounds positive, no rebound, no ascites, no appreciable mass Extremities: No significant cyanosis, clubbing, or edema bilateral lower extremities  Basic Metabolic Panel: Recent Labs  Lab 01/04/24 0517 01/08/24 0925 01/09/24 0634  NA 138 138 138  K 3.7 4.2 3.9  CL 105 105 105  CO2 23 26 24   GLUCOSE 91 96 87  BUN 16 15 10   CREATININE 0.61 0.74 0.44  CALCIUM 9.2 9.3 9.7  MG  --  1.7  --     CBC: Recent Labs  Lab 01/04/24 0517 01/08/24 0925 01/09/24 0634  WBC 5.3 5.6 5.0  NEUTROABS  --  2.1  --   HGB 12.2 12.2 12.4  HCT 38.3 38.7 38.6  MCV 74.4* 74.1* 73.8*  PLT 138* 206 192    Time spent in discharge (includes decision making & examination of pt): 35 minutes  01/10/2024, 2:49 PM   Abbe Abate, MD [facility] Office  [phone]

## 2024-01-11 DIAGNOSIS — F332 Major depressive disorder, recurrent severe without psychotic features: Principal | ICD-10-CM

## 2024-01-11 MED ORDER — METHIMAZOLE 5 MG PO TABS
20.0000 mg | ORAL_TABLET | Freq: Two times a day (BID) | ORAL | Status: DC
  Administered 2024-01-12 – 2024-01-13 (×3): 20 mg via ORAL
  Filled 2024-01-11 (×3): qty 4

## 2024-01-11 NOTE — Plan of Care (Addendum)
 Alison Cummings
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
## 2024-01-11 NOTE — Group Note (Signed)
 Date:  01/11/2024 Time:  5:16 PM  Group Topic/Focus:   Managing Stress:   The focus of this group is to identify ways to manage stress and how patients can apply these strategies in their personal lives.     Participation Level:  Did Not Attend   Sheryl Donna 01/11/2024, 5:16 PM

## 2024-01-11 NOTE — Plan of Care (Signed)
   Problem: Education: Goal: Knowledge of [facility] Education information/materials will improve Outcome: Progressing   Problem: Education: Goal: Verbalization of understanding the information provided will improve Outcome: Progressing

## 2024-01-11 NOTE — B[facility] Group Notes (Signed)
 B[facility] Group Notes:  (Nursing/MHT/Case Management/Adjunct)  Date:  01/11/2024  Time:  2000  Type of Therapy:  Wrap up group  Participation Level:  Active  Participation Quality:  Appropriate, Attentive, Sharing, and Supportive  Affect:  Appropriate  Cognitive:  Lacking  Insight:  Limited  Engagement in Group:  Engaged  Modes of Intervention:  Clarification, Education, and Support  Summary of Progress/Problems:Positive thinking and positive change were discussed.   Catharine Clock 01/11/2024, 9:19 PM

## 2024-01-11 NOTE — Progress Notes (Signed)
 CSW made two attempts to complete PSA with pt at 9:20 AM and 1:51 PM Patient was laying in bed both times and requested to complete PSA at a later time.

## 2024-01-11 NOTE — B[facility] Suicide Risk Assessment (Signed)
 [facility] Admission Suicide Risk Assessment   Nursing information obtained from:    Demographic factors:  Adolescent or young adult, Low socioeconomic status Current Mental Status:  NA Loss Factors:  NA Historical Factors:  NA Risk Reduction Factors:  NA  Total Time spent with patient: 45 minutes Principal Problem: Major depressive disorder, recurrent severe without psychotic features (HCC) Diagnosis:  Principal Problem:   Major depressive disorder, recurrent severe without psychotic features (HCC)   Subjective Data:  Alison Cummings is a 23 y.o., female with history of MDD, ADHD, hyperthyroidism, hypertension, ectopic pregnancy 1 month ago, medication nonadherence presented to [facility] due to 3-day history of suicidal ideation as well as reportedly hallucinations. She was admitted to [facility] 6/2 for psychiatric stabilization; however, she had a syncopal episode with possible seizure-like activity resulting in her being transferred to [facility] for medical clearance.  In the ED, she was found to have a TSH of 0.01 and free T4 of 4.99. Se was admitted to medicine service and started on metoprolol  and Tapazole  which she is supposed to be taking but has not for at least several months. She was found to have a pregnancy test on 6/4 which was explained by patient's ectopic pregnancy and methotrexate injections.  It was recommended for outpatient management for medical problems including definitive treatment of hyperthyroidism via radioiodine treatment versus surgery as well as 1 to 2-week follow-up after discharge with OB/GYN.  She was readmitted to behavioral health hospital for continued psychiatric management.   She currently reports significant improvement in mood as compared to when she was first admitted.  She denies present SI/HI/AVH address her safety.  Reports eating and sleeping well.  She denies feeling dizzy. She reports she was informed by psychiatric consult service that she needed to  return to [facility] to establish safety planning and then will be discharged.  We discussed will continue to monitor patient and should she continue to be psychiatrically stable, she can be discharged.  She denies any acute somatic complaints from Lexapro  or Abilify .  Patient is current medication regiment for psychiatric problems are Lexapro  10 mg daily and Abilify  5 mg daily.  Continued Clinical Symptoms:  Alcohol Use Disorder Identification Test Final Score (AUDIT): 0 The "Alcohol Use Disorders Identification Test", Guidelines for Use in Primary Care, Second Edition.  World Science writer [facility]). Score between 0-7:  no or low risk or alcohol related problems. Score between 8-15:  moderate risk of alcohol related problems. Score between 16-19:  high risk of alcohol related problems. Score 20 or above:  warrants further diagnostic evaluation for alcohol dependence and treatment.   CLINICAL FACTORS:   Depression:   Severe Previous Psychiatric Diagnoses and Treatments   Musculoskeletal: Strength & Muscle Tone: within normal limits Gait & Station: normal Patient leans: N/A  Psychiatric Specialty Exam:  Presentation  General Appearance:  Appropriate for Environment; Casual   Eye Contact: Good   Speech: Clear and Coherent; Normal Rate   Speech Volume: Normal   Handedness: Right   Mood and Affect  Mood: Euthymic   Affect: Flat    Thought Process  Thought Processes: Coherent; Goal Directed; Linear   Descriptions of Associations:Intact   Orientation:Full (Time, Place and Person)   Thought Content:Logical   History of Schizophrenia/Schizoaffective disorder:Yes   Duration of Psychotic Symptoms:No data recorded  Hallucinations:Hallucinations: None   Ideas of Reference:None   Suicidal Thoughts:Suicidal Thoughts: No   Homicidal Thoughts:Homicidal Thoughts: No    Sensorium  Memory: Immediate Good; Recent Good;  Remote  Good   Judgment: Fair   Insight: Fair    Chartered certified accountant: Good   Attention Span: Good   Recall: Good   Fund of Knowledge: Good   Language: Good    Psychomotor Activity  Psychomotor Activity: Psychomotor Activity: Normal    Assets  Assets: Communication Skills; Desire for Improvement; Physical Health    Sleep  Sleep: Sleep: Fair     Physical Exam: Physical Exam ROS Blood pressure 124/74, pulse (!) 116, temperature 98.5 F (36.9 C), temperature source Oral, resp. rate 18, height 5\' 2"  (1.575 m), weight 90.3 kg, last menstrual period 12/19/2023, SpO2 100%, unknown if currently breastfeeding. Body mass index is 36.4 kg/m.   COGNITIVE FEATURES THAT CONTRIBUTE TO RISK:  None    SUICIDE RISK:   Mild:  Suicidal ideation of limited frequency, intensity, duration, and specificity.  There are no identifiable plans, no associated intent, mild dysphoria and related symptoms, good self-control (both objective and subjective assessment), few other risk factors, and identifiable protective factors, including available and accessible social support.  PLAN OF CARE: see H&P  I certify that inpatient services furnished can reasonably be expected to improve the patient's condition.   Augusta Blizzard, MD 01/11/2024, 11:14 AM

## 2024-01-11 NOTE — Group Note (Signed)
 LCSW Group Therapy Note  Group Date: 01/11/2024 Start Time: 1030 End Time: 1200   Type of Therapy and Topic:  Soothing Skills Participation Level:  Did Not Attend   Description of Group The focus of this group was to determine what unhealthy coping techniques typically are used by group members and what healthy coping techniques would be helpful in coping with various problems. Patients were guided in becoming aware of the differences between healthy and unhealthy coping techniques. Patients were asked to identify 2-3 healthy coping skills they would like to learn to use more effectively.  Therapeutic Goals Patients learned that coping is what human beings do all day long to deal with various situations in their lives Patients defined and discussed healthy vs unhealthy coping techniques Patients identified their preferred coping techniques and identified whether these were healthy or unhealthy Patients determined 2-3 healthy coping skills they would like to become more familiar with and use more often. Patients provided support and ideas to each other   Summary of Patient Progress:   Patient did not attend group.  Therapeutic Modalities Cognitive Behavioral Therapy DBT   Mariano Shiver, LCSWA 01/11/2024  12:30 PM

## 2024-01-11 NOTE — Progress Notes (Signed)
   01/11/24 0530  15 Minute Checks  Location Bedroom  Visual Appearance Calm  Behavior Sleeping  Sleep (Behavioral Health Patients Only)  Calculate sleep? (Click Yes once per 24 hr at 0600 safety check) Yes  Documented sleep last 24 hours 7.25

## 2024-01-11 NOTE — Plan of Care (Signed)
  Problem: Education: Goal: Emotional status will improve 01/11/2024 1922 by Victorine Grater, RN Outcome: Progressing 01/11/2024 1921 by Victorine Grater, RN Outcome: Progressing Goal: Mental status will improve 01/11/2024 1922 by Victorine Grater, RN Outcome: Progressing 01/11/2024 1921 by Victorine Grater, RN Outcome: Progressing   Problem: Activity: Goal: Interest or engagement in activities will improve 01/11/2024 1922 by Victorine Grater, RN Outcome: Progressing 01/11/2024 1921 by Victorine Grater, RN Outcome: Progressing

## 2024-01-11 NOTE — Group Note (Signed)
 Date:  01/11/2024 Time:  5:11 PM  Group Topic/Focus:  Goals Group:   The focus of this group is to help patients establish daily goals to achieve during treatment and discuss how the patient can incorporate goal setting into their daily lives to aide in recovery. Orientation:   The focus of this group is to educate the patient on the purpose and policies of crisis stabilization and provide a format to answer questions about their admission.  The group details unit policies and expectations of patients while admitted.    Participation Level:  Did Not Attend   Sheryl Donna 01/11/2024, 5:11 PM

## 2024-01-11 NOTE — Group Note (Deleted)
 LCSW Group Therapy Note  Group Date: 01/11/2024 Start Time: 1030 End Time: 1230   Type of Therapy and Topic:  Group Therapy - Healthy vs Unhealthy Coping Skills  Participation Level:  {[facility] PARTICIPATION [id number]}   Description of Group The focus of this group was to determine what unhealthy coping techniques typically are used by group members and what healthy coping techniques would be helpful in coping with various problems. Patients were guided in becoming aware of the differences between healthy and unhealthy coping techniques. Patients were asked to identify 2-3 healthy coping skills they would like to learn to use more effectively.  Therapeutic Goals 1. Patients learned that coping is what human beings do all day long to deal with various situations in their lives 2. Patients defined and discussed healthy vs unhealthy coping techniques 3. Patients identified their preferred coping techniques and identified whether these were healthy or unhealthy 4. Patients determined 2-3 healthy coping skills they would like to become more familiar with and use more often. 5. Patients provided support and ideas to each other   Summary of Patient Progress:  During group, *** expressed ***. Patient proved open to input from peers and feedback from CSW. Patient demonstrated *** insight into the subject matter, was respectful of peers, and participated throughout the entire session.   Therapeutic Modalities Cognitive Behavioral Therapy Motivational Interviewing  Mariano Shiver, LCSWA 01/11/2024  12:36 PM

## 2024-01-11 NOTE — Progress Notes (Signed)
   01/11/24 1600  Psych Admission Type (Psych Patients Only)  Admission Status Voluntary  Psychosocial Assessment  Patient Complaints None  Eye Contact Fair  Facial Expression Animated  Affect Appropriate to circumstance  Speech Logical/coherent  Interaction Assertive  Motor Activity Fidgety  Appearance/Hygiene In scrubs  Behavior Characteristics Cooperative;Fidgety  Mood Anxious;Pleasant  Thought Process  Coherency WDL  Content WDL  Delusions None reported or observed  Perception WDL  Hallucination None reported or observed  Judgment Limited  Confusion None  Danger to Self  Current suicidal ideation? Denies  Danger to Others  Danger to Others None reported or observed

## 2024-01-12 ENCOUNTER — Encounter (HOSPITAL_COMMUNITY): Payer: Self-pay

## 2024-01-12 LAB — THYROTROPIN RECEPTOR AUTOABS: Thyrotropin Receptor Ab: 124 IU/L — ABNORMAL HIGH (ref 0.00–1.75)

## 2024-01-12 MED ORDER — LOPERAMIDE HCL 2 MG PO CAPS: 2.0000 mg | ORAL_CAPSULE | Freq: Once | ORAL | Status: DC

## 2024-01-12 NOTE — Progress Notes (Signed)
 [facility] MD Progress Note  01/12/2024 1:34 PM Alison Cummings  MRN:  [id number] Subjective:   Alison Cummings is a 23 yr old female who presented on 6/1 to [facility] with SI, she was readmitted to [facility] on 6/8. PPHx is significant for Bipolar Disorder and ADHD, and a remote history of Self Injurious behavior (cutting last 2021) and 2 Prior Psychiatric Hospitalizations (last- [facility] 10/2023).    Case was discussed in the multidisciplinary team. MAR was reviewed and patient was compliant with medications.  She received PRN Hydroxyzine  and Milk of Magnesia yesterday.   Psychiatric Team made the following recommendations yesterday: -Continue Abilify  5 mg QHS for augmentation and mood stability -Continue Lexapro  10 mg QHS for depression and anxiety    On interview today patient reports she slept good last night.  She reports her appetite is doing good.  She reports no SI, HI, or AVH.  She reports no Paranoia or Ideas of Reference.  She reports no issues with her medications.  She reports that she is feeling much better.  Discussed with her that we would have Social Work discuss options with her about discharge.  Discussed with her that we would look at discharge tomorrow if she continues to do well.  She reports no other concerns at present.   Principal Problem: Major depressive disorder, recurrent severe without psychotic features (HCC) Diagnosis: Principal Problem:   Major depressive disorder, recurrent severe without psychotic features (HCC)  Total Time spent with patient:  I personally spent 35 minutes on the unit in direct patient care. The direct patient care time included face-to-face time with the patient, reviewing the patient's chart, communicating with other professionals, and coordinating care. Greater than 50% of this time was spent in counseling or coordinating care with the patient regarding goals of hospitalization, psycho-education, and discharge planning needs.   Past  Psychiatric History:  Bipolar Disorder and ADHD, and a remote history of Self Injurious behavior (cutting last 2021) and 2 Prior Psychiatric Hospitalizations (last- [facility] 10/2023).   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   IMO SNOMED Dx Update Oct 2024     High blood pressure 06/11/2023   Hypertension    Hyperthyroidism 06/11/2023   Obesity    Seasonal allergies    Seizures (HCC)     Past Surgical History:  Procedure Laterality Date   ADENOIDECTOMY     Family History:  Family History  Problem Relation Age of Onset   Schizophrenia Mother    High blood pressure Mother    Hyperlipidemia Mother    High blood pressure Father    ADD / ADHD Father    Bipolar disorder Father    Stroke Father    Diabetes Sister    Hyperlipidemia Sister    Family Psychiatric  History:  Mother- Schizophrenia  Father- Bipolar Disorder, ADHD, Crake Abuse No Known Suicides  Social History:  Social History   Substance and Sexual Activity  Alcohol Use No     Social History   Substance and Sexual Activity  Drug Use Not Currently   Types: Marijuana   Comment: sometimes    Social History   Socioeconomic History   Marital status: Single    Spouse  name: Not on file   Number of children: Not on file   Years of education: Not on file   Highest education level: Not on file  Occupational History   Not on file  Tobacco Use   Smoking status: Former    Types: Cigarettes   Smokeless tobacco: Never  Vaping Use   Vaping status: Never Used  Substance and Sexual Activity   Alcohol use: No   Drug use: Not Currently    Types: Marijuana    Comment: sometimes   Sexual activity: Yes    Birth  control/protection: None  Other Topics Concern   Not on file  Social History Narrative   Lives with her mother and grandmother    Social Drivers of Health   Financial Resource Strain: Low Risk  (10/22/2023)   Received from [facility]   Overall Financial Resource Strain (CARDIA)    Difficulty of Paying Living Expenses: Not hard at all  Food Insecurity: Food Insecurity Present (01/10/2024)   Hunger Vital Sign    Worried About Running Out of Food in the Last Year: Sometimes true    Ran Out of Food in the Last Year: Sometimes true  Transportation Needs: No Transportation Needs (01/10/2024)   PRAPARE - Administrator, Civil Service (Medical): No    Lack of Transportation (Non-Medical): No  Physical Activity: Not on File (11/22/2021)   Received from [place], [place]   Physical Activity    Physical Activity: 0  Stress: Not on File (11/22/2021)   Received from [facility], [place]   Stress    Stress: 0  Social Connections: Not on File (04/19/2023)   Received from [facility]   Social Connections    Connectedness: 0   Additional Social History:                         Sleep: Good  Appetite:  Good  Current Medications: Current Facility-Administered Medications  Medication Dose Route Frequency Provider Last Rate Last Admin   acetaminophen  (TYLENOL ) tablet 650 mg  650 mg Oral Q6H PRN Lissa Riding, NP       alum & mag hydroxide-simeth (MAALOX/MYLANTA) 200-200-20 MG/5ML suspension 30 mL  30 mL Oral Q4H PRN Lissa Riding, NP       ARIPiprazole  (ABILIFY ) tablet 5 mg  5 mg Oral Daily Lissa Riding, NP   5 mg at 01/12/24 0756   atenolol  (TENORMIN ) tablet 50 mg  50 mg Oral Daily Lissa Riding, NP   50 mg at 01/12/24 0754   escitalopram  (LEXAPRO ) tablet 10 mg  10 mg Oral Daily Lissa Riding, NP   10 mg at 01/12/24 0756   hydrOXYzine  (ATARAX ) tablet 25 mg  25 mg Oral Q6H PRN Ajibola, Ene A, NP   25 mg at 01/11/24 2106   magnesium  hydroxide (MILK OF MAGNESIA) suspension 30 mL   30 mL Oral Daily PRN Lissa Riding, NP   30 mL at 01/11/24 2121   methimazole  (TAPAZOLE ) tablet 20 mg  20 mg Oral BID Linnie Riches, Nadir, MD   20 mg at 01/12/24 0802   OLANZapine  (ZYPREXA ) tablet 10 mg  10 mg Oral BID PRN Lissa Riding, NP       Or   OLANZapine  (ZYPREXA ) injection 10 mg  10 mg Intramuscular BID PRN Lissa Riding, NP       traZODone  (DESYREL ) tablet 50 mg  50 mg Oral QHS PRN Ajibola, Ene A, NP  Lab Results: No results found for this or any previous visit (from the past 48 hours).  Blood Alcohol level:  Lab Results  Component Value Date   [facility] <15 01/04/2024   ETH <10 04/10/2023    Metabolic Disorder Labs: Lab Results  Component Value Date   HGBA1C 5.3 01/06/2024   MPG 105.41 01/06/2024   Lab Results  Component Value Date   PROLACTIN 13.8 04/10/2023   Lab Results  Component Value Date   CHOL 117 01/06/2024   TRIG 32 01/06/2024   HDL 42 01/06/2024   CHOLHDL 2.8 01/06/2024   VLDL 6 01/06/2024   LDLCALC 69 01/06/2024    Physical Findings: AIMS:  , ,  ,  ,    CIWA:    COWS:     Musculoskeletal: Strength & Muscle Tone: within normal limits Gait & Station: normal Patient leans: N/A  Psychiatric Specialty Exam:  Presentation  General Appearance:  Appropriate for Environment; Casual  Eye Contact: Good  Speech: Clear and Coherent; Normal Rate  Speech Volume: Normal  Handedness: Right   Mood and Affect  Mood: Euthymic  Affect: Congruent; Appropriate   Thought Process  Thought Processes: Coherent; Goal Directed  Descriptions of Associations:Intact  Orientation:Full (Time, Place and Person)  Thought Content:Logical; WDL  History of Schizophrenia/Schizoaffective disorder:Yes  Duration of Psychotic Symptoms:No data recorded Hallucinations:Hallucinations: None  Ideas of Reference:None  Suicidal Thoughts:Suicidal Thoughts: No  Homicidal Thoughts:Homicidal Thoughts: No   Sensorium  Memory: Immediate Good; Recent  Good  Judgment: Fair  Insight: Fair   Art therapist  Concentration: Good  Attention Span: Good  Recall: Good  Fund of Knowledge: Good  Language: Good   Psychomotor Activity  Psychomotor Activity: Psychomotor Activity: Normal   Assets  Assets: Communication Skills; Desire for Improvement; Physical Health   Sleep  Sleep: Sleep: Good    Physical Exam: Physical Exam Vitals and nursing note reviewed.  Constitutional:      General: She is not in acute distress.    Appearance: Normal appearance. She is obese. She is not ill-appearing or toxic-appearing.  HENT:     Head: Normocephalic and atraumatic.  Pulmonary:     Effort: Pulmonary effort is normal.  Musculoskeletal:        General: Normal range of motion.  Neurological:     General: No focal deficit present.     Mental Status: She is alert.    Review of Systems  Respiratory:  Negative for cough and shortness of breath.   Cardiovascular:  Negative for chest pain.  Gastrointestinal:  Negative for abdominal pain, constipation, diarrhea, nausea and vomiting.  Neurological:  Negative for dizziness, weakness and headaches.  Psychiatric/Behavioral:  Negative for depression, hallucinations and suicidal ideas. The patient is not nervous/anxious.    Blood pressure (!) 140/83, pulse (!) 108, temperature 98.2 F (36.8 C), temperature source Oral, resp. rate 20, height 5\' 2"  (1.575 m), weight 90.3 kg, last menstrual period 12/19/2023, SpO2 99%, unknown if currently breastfeeding. Body mass index is 36.4 kg/m.   Treatment Plan Summary: Daily contact with patient to assess and evaluate symptoms and progress in treatment and Medication management  Alison Cummings is a 23 yr old female who presented on 6/1 to [facility] with SI, she was readmitted to [facility] on 6/8. PPHx is significant for Bipolar Disorder and ADHD, and a remote history of Self Injurious behavior (cutting last 2021) and 2 Prior Psychiatric  Hospitalizations (last- [facility] 10/2023).    Alison Cummings has responded well to her medications and  is reporting her symptoms have almost completely receded.  If she continues to do well today we will plan for discharge tomorrow.  We will not make any changes to her medications at this time     MDD, Recurrent, Severe, w/out Psychosis  GAD  PTSD: -Continue Abilify  5 mg QHS for augmentation and mood stability -Continue Lexapro  10 mg QHS for depression and anxiety  -Continue Agitation Protocol: Zyprexa      -Continue home Methimazole  10 mg BID for Hyperthyroidism  -Continue Atenolol  50 mg daily for HTN -Continue PRN's: Tylenol , Maalox, Atarax , Milk of Magnesia, Trazodone      --  The risks/benefits/side-effects/alternatives to medications were discussed in detail with the patient and time was given for questions. The patient consents to medication trials.                -- Metabolic profile and EKG monitoring obtained while on an atypical antipsychotic  CMP: WNL, CBC: WNL except RBC: 5.15, MCV: 74.4, MCH: 23.7, RDW: 16.1, Plat: 138, UDS: Neg, hCG quant: 10, TSH: <0.010, Free T4: 5.26,  A1c: 5.3,  Lipid Panel: WNL,  EKG: Sinus Tach (102) w/ Qtc: 417             -- Encouraged patient to participate in unit milieu and in scheduled group therapies              -- Short Term Goals: Ability to identify changes in lifestyle to reduce recurrence of condition will improve, Ability to verbalize feelings will improve, Ability to disclose and discuss suicidal ideas, Ability to demonstrate self-control will improve, Ability to identify and develop effective coping behaviors will improve, Ability to maintain clinical measurements within normal limits will improve, Compliance with prescribed medications will improve, and Ability to identify triggers associated with substance abuse/mental health issues will improve             -- Long Term Goals: Improvement in symptoms so as ready for discharge     Safety  and Monitoring:             -- Voluntary admission to inpatient psychiatric unit for safety, stabilization and treatment             -- Daily contact with patient to assess and evaluate symptoms and progress in treatment             -- Patient's case to be discussed in multi-disciplinary team meeting             -- Observation Level : q15 minute checks             -- Vital signs:  q12 hours             -- Precautions: suicide, elopement, and assault   Discharge Planning:              -- Social work and case management to assist with discharge planning and identification of hospital follow-up needs prior to discharge             -- Estimated LOS: 1-2 more days             -- Discharge Concerns: Need to establish a safety plan; Medication compliance and effectiveness             -- Discharge Goals: Return home with outpatient referrals for mental health follow-up including medication management/psychotherapy  Alison Bosworth, DO 01/12/2024, 1:34 PM

## 2024-01-12 NOTE — Plan of Care (Signed)
  Problem: Education: Goal: Knowledge of [place] [facility] Education information/materials will improve Outcome: Progressing Goal: Mental status will improve Outcome: Progressing   Problem: Activity: Goal: Interest or engagement in activities will improve Outcome: Progressing   

## 2024-01-12 NOTE — Group Note (Signed)
 Date:  01/12/2024 Time:  9:03 AM  Group Topic/Focus:  Goals Group:   The focus of this group is to help patients establish daily goals to achieve during treatment and discuss how the patient can incorporate goal setting into their daily lives to aide in recovery.    Participation Level:  Did Not Attend   Alison Cummings 01/12/2024, 9:03 AM

## 2024-01-12 NOTE — BH Assessment (Signed)
 Patient calm and cooperative this shift. Denies any SI/HI and AVH. Attended group and had snack with other patients tonight.Pt is happy d/t looking forward to discharge tomorrow. 15 min checks maintained.

## 2024-01-12 NOTE — Group Note (Signed)
 Recreation Therapy Group Note   Group Topic:Stress Management  Group Date: 01/12/2024 Start Time: 0932 End Time: 0958 Facilitators: Kieran Nachtigal-McCall, LRT,CTRS Location: 300 Hall Dayroom   Group Topic: Stress Management  Goal Area(s) Addresses:  Patient will identify positive stress management techniques. Patient will identify benefits of using stress management post d/c.  Behavioral Response:   Intervention: Calm App  Activity: Meditation. LRT played a meditation for patients called the [facility] meditation. This meditation focused on taking the strength and resilience that mountains represent and bringing them into your daily life.    Education:  Stress Management, Discharge Planning.   Education Outcome: Acknowledges Education   Affect/Mood: N/A   Participation Level: Did not attend    Clinical Observations/Individualized Feedback:     Plan: Continue to engage patient in RT group sessions 2-3x/week.   Bettie Swavely-McCall, LRT,CTRS 01/12/2024 12:46 PM

## 2024-01-12 NOTE — [facility] Group Notes (Addendum)
Pt did not attend AA group  

## 2024-01-12 NOTE — Plan of Care (Signed)
   Problem: Education: Goal: Knowledge of [place] [facility] Education information/materials will improve Outcome: Progressing Goal: Emotional status will improve Outcome: Progressing Goal: Mental status will improve Outcome: Progressing

## 2024-01-12 NOTE — BH IP Treatment Plan (Signed)
 Interdisciplinary Treatment and Diagnostic Plan Update  01/12/2024 Time of Session: 1055 Alison Cummings MRN: [id number]  Principal Diagnosis: Major depressive disorder, recurrent severe without psychotic features (HCC)  Secondary Diagnoses: Principal Problem:   Major depressive disorder, recurrent severe without psychotic features (HCC)   Current Medications:  Current Facility-Administered Medications  Medication Dose Route Frequency Provider Last Rate Last Admin   acetaminophen  (TYLENOL ) tablet 650 mg  650 mg Oral Q6H PRN Lord, Jamison Y, NP       alum & mag hydroxide-simeth (MAALOX/MYLANTA) 200-200-20 MG/5ML suspension 30 mL  30 mL Oral Q4H PRN Lord, Jamison Y, NP       ARIPiprazole  (ABILIFY ) tablet 5 mg  5 mg Oral Daily Lissa Riding, NP   5 mg at 01/12/24 0756   atenolol  (TENORMIN ) tablet 50 mg  50 mg Oral Daily Lissa Riding, NP   50 mg at 01/12/24 0754   escitalopram  (LEXAPRO ) tablet 10 mg  10 mg Oral Daily Lord, Jamison Y, NP   10 mg at 01/12/24 0756   hydrOXYzine  (ATARAX ) tablet 25 mg  25 mg Oral Q6H PRN Ajibola, Ene A, NP   25 mg at 01/11/24 2106   magnesium  hydroxide (MILK OF MAGNESIA) suspension 30 mL  30 mL Oral Daily PRN Lissa Riding, NP   30 mL at 01/11/24 2121   methimazole  (TAPAZOLE ) tablet 20 mg  20 mg Oral BID Alver Jobs, MD   20 mg at 01/12/24 0802   OLANZapine  (ZYPREXA ) tablet 10 mg  10 mg Oral BID PRN Lissa Riding, NP       Or   OLANZapine  (ZYPREXA ) injection 10 mg  10 mg Intramuscular BID PRN Lord, Jamison Y, NP       traZODone  (DESYREL ) tablet 50 mg  50 mg Oral QHS PRN Ajibola, Ene A, NP       PTA Medications: Medications Prior to Admission  Medication Sig Dispense Refill Last Dose/Taking   acetaminophen  (TYLENOL ) 325 MG tablet Take 2 tablets (650 mg total) by mouth every 6 (six) hours as needed for mild pain (pain score 1-3), fever or headache.      ARIPiprazole  (ABILIFY ) 5 MG tablet Take 1 tablet (5 mg total) by mouth daily.      atenolol   (TENORMIN ) 50 MG tablet Take 1 tablet (50 mg total) by mouth daily.      escitalopram  (LEXAPRO ) 10 MG tablet Take 1 tablet (10 mg total) by mouth daily.      methimazole  (TAPAZOLE ) 10 MG tablet Take 2 tablets (20 mg total) by mouth 2 (two) times daily.       Patient Stressors:    Patient Strengths:    Treatment Modalities: Medication Management, Group therapy, Case management,  1 to 1 session with clinician, Psychoeducation, Recreational therapy.   Physician Treatment Plan for Primary Diagnosis: Major depressive disorder, recurrent severe without psychotic features (HCC) Long Term Goal(s):     Short Term Goals:    Medication Management: Evaluate patient's response, side effects, and tolerance of medication regimen.  Therapeutic Interventions: 1 to 1 sessions, Unit Group sessions and Medication administration.  Evaluation of Outcomes: Not Progressing  Physician Treatment Plan for Secondary Diagnosis: Principal Problem:   Major depressive disorder, recurrent severe without psychotic features (HCC)  Long Term Goal(s):     Short Term Goals:       Medication Management: Evaluate patient's response, side effects, and tolerance of medication regimen.  Therapeutic Interventions: 1 to 1 sessions, Unit Group sessions and Medication administration.  Evaluation of Outcomes: Not Progressing   RN Treatment Plan for Primary Diagnosis: Major depressive disorder, recurrent severe without psychotic features (HCC) Long Term Goal(s): Knowledge of disease and therapeutic regimen to maintain health will improve  Short Term Goals: Ability to remain free from injury will improve, Ability to verbalize frustration and anger appropriately will improve, Ability to demonstrate self-control, Ability to participate in decision making will improve, Ability to verbalize feelings will improve, Ability to disclose and discuss suicidal ideas, Ability to identify and develop effective coping behaviors will  improve, and Compliance with prescribed medications will improve  Medication Management: RN will administer medications as ordered by provider, will assess and evaluate patient's response and provide education to patient for prescribed medication. RN will report any adverse and/or side effects to prescribing provider.  Therapeutic Interventions: 1 on 1 counseling sessions, Psychoeducation, Medication administration, Evaluate responses to treatment, Monitor vital signs and CBGs as ordered, Perform/monitor CIWA, COWS, AIMS and Fall Risk screenings as ordered, Perform wound care treatments as ordered.  Evaluation of Outcomes: Not Progressing   LCSW Treatment Plan for Primary Diagnosis: Major depressive disorder, recurrent severe without psychotic features (HCC) Long Term Goal(s): Safe transition to appropriate next level of care at discharge, Engage patient in therapeutic group addressing interpersonal concerns.  Short Term Goals: Engage patient in aftercare planning with referrals and resources, Increase social support, Increase ability to appropriately verbalize feelings, Increase emotional regulation, Facilitate acceptance of mental health diagnosis and concerns, Facilitate patient progression through stages of change regarding substance use diagnoses and concerns, Identify triggers associated with mental health/substance abuse issues, and Increase skills for wellness and recovery  Therapeutic Interventions: Assess for all discharge needs, 1 to 1 time with Social worker, Explore available resources and support systems, Assess for adequacy in community support network, Educate family and significant other(s) on suicide prevention, Complete Psychosocial Assessment, Interpersonal group therapy.  Evaluation of Outcomes: Not Progressing   Progress in Treatment: Attending groups: No. Participating in groups: No. Taking medication as prescribed: Yes. Toleration medication: Yes. Family/Significant  other contact made: No, will contact:  PSA/Consents pending Patient understands diagnosis: Yes. Discussing patient identified problems/goals with staff: Yes. Medical problems stabilized or resolved: Yes. Denies suicidal/homicidal ideation: Yes. Issues/concerns per patient self-inventory: No. Other: none  New problem(s) identified: No, Describe:  n/a  New Short Term/Long Term Goal(s): medication stabilization, elimination of SI thoughts, development of comprehensive mental wellness plan  Patient Goals: "help with a psychiatrist and therapist"    Discharge Plan or Barriers: Patient recently admitted. CSW will continue to follow and assess for appropriate referrals and possible discharge planning.    Reason for Continuation of Hospitalization: Anxiety Depression Medication stabilization Other; describe mood stabilization, discharge planning  Estimated Length of Stay: 3-5 DAYS  Last 3 [place] Suicide Severity Risk Score: Flowsheet Row Ad[facility] (Current) [facility] from 01[facility]04/2024 in [facility] ED from 12/06/2023 in [facility] Department at [facility]  C-SSRS RISK CATEGORY High Risk High Risk No Risk       Last [facility] 2/9 Scores:    06/11/2023   10:37 AM  Depression screen PHQ 2/9  Decreased Interest 0  Down, Depressed, Hopeless 0  PHQ - 2 Score 0  Altered sleeping 1  Tired, decreased energy 1  Change in appetite 0  Feeling bad or failure about yourself  0  Trouble concentrating 3  Moving slowly or fidgety/restless 3  Suicidal thoughts 0  PHQ-9 Score 8  Difficult doing work/chores Not difficult at all    Scribe for Treatment Team: Inocencio Roy N Merina Behrendt, LCSW 01/12/2024 10:30 AM

## 2024-01-12 NOTE — Progress Notes (Signed)
 Pt appears bright and forward thinking to discharge. Pt verbalized understanding about seeking after care for her medical issues when discharged.  Pt denied SI/HI/AVH and pain.   01/12/24 1100  Psych Admission Type (Psych Patients Only)  Admission Status Voluntary  Psychosocial Assessment  Patient Complaints None  Eye Contact Fair  Facial Expression Animated  Affect Appropriate to circumstance  Speech Logical/coherent  Interaction Assertive  Motor Activity Fidgety  Appearance/Hygiene Disheveled  Behavior Characteristics Cooperative  Mood Pleasant  Thought Process  Coherency WDL  Content WDL  Delusions None reported or observed  Perception WDL  Hallucination None reported or observed  Judgment Limited  Confusion None  Danger to Self  Current suicidal ideation? Denies  Agreement Not to Harm Self Yes  Description of Agreement Verbal  Danger to Others  Danger to Others None reported or observed

## 2024-01-13 LAB — VITAMIN B12: Vitamin B-12: 554 pg/mL (ref 180–914)

## 2024-01-13 LAB — VITAMIN D 25 HYDROXY (VIT D DEFICIENCY, FRACTURES): Vit D, 25-Hydroxy: 36.5 ng/mL (ref 30–100)

## 2024-01-13 LAB — RPR: RPR Ser Ql: NONREACTIVE

## 2024-01-13 LAB — FOLATE: Folate: 19.6 ng/mL (ref >5.9)

## 2024-01-13 MED ORDER — METHIMAZOLE 10 MG PO TABS: 20.0000 mg | ORAL_TABLET | Freq: Two times a day (BID) | ORAL | 0 refills | Status: DC

## 2024-01-13 MED ORDER — ATENOLOL 50 MG PO TABS: 50.0000 mg | ORAL_TABLET | Freq: Every day | ORAL | 0 refills | Status: DC

## 2024-01-13 MED ORDER — ARIPIPRAZOLE 5 MG PO TABS: 5.0000 mg | ORAL_TABLET | Freq: Every day | ORAL | 0 refills | Status: DC

## 2024-01-13 MED ORDER — HYDROXYZINE HCL 25 MG PO TABS: 25.0000 mg | ORAL_TABLET | Freq: Four times a day (QID) | ORAL | 0 refills | Status: DC | PRN

## 2024-01-13 MED ORDER — ESCITALOPRAM OXALATE 10 MG PO TABS: 10.0000 mg | ORAL_TABLET | Freq: Every day | ORAL | 0 refills | Status: DC

## 2024-01-13 NOTE — Group Note (Signed)
 Date:  01/13/2024 Time:  8:53 AM  Group Topic/Focus:  Goals Group:   The focus of this group is to help patients establish daily goals to achieve during treatment and discuss how the patient can incorporate goal setting into their daily lives to aide in recovery. Orientation:   The focus of this group is to educate the patient on the purpose and policies of crisis stabilization and provide a format to answer questions about their admission.  The group details unit policies and expectations of patients while admitted.    Participation Level:  Active  Participation Quality:  Appropriate  Affect:  Appropriate  Cognitive:  Appropriate  Insight: Appropriate  Engagement in Group:  Engaged  Modes of Intervention:  Orientation  Additional Comments:  arrange discharge  Alison Cummings 01/13/2024, 8:53 AM

## 2024-01-13 NOTE — Progress Notes (Signed)
  [facility] Adult Case Management Discharge Plan :  Will you be returning to the same living situation after discharge:  No. Pt will be discharging to [facility] after discharge.  At discharge, do you have transportation home?: Yes,  Pt will be transported via taxi voucher.  Do you have the ability to pay for your medications: Yes,  Pt is insured - Dealer of information consent forms completed and in the chart;  Patient's signature needed at discharge.  Patient to Follow up at:  Follow-up Information     [facility] Follow up on 01/20/2024.   Why: You have a hospital follow up appointment for therapy and medication management services on 01/20/24 at 8:30 am.  This will be a Virtual telehealth appointment. Contact information: [address]  [address] [place] [address] [phone]                 Next level of care provider has access to [facility] Link:no  Safety Planning and Suicide Prevention discussed: Yes,  completed with patient as she did not give consent to contact family/friends.     Has patient been referred to the Quitline?: Patient does not use tobacco/nicotine products  Patient has been referred for addiction treatment: No known substance use disorder.  Tayden Nichelson M Karagan Lehr, LCSWA 01/13/2024, 9:01 AM

## 2024-01-13 NOTE — Discharge Instructions (Signed)
 Please visit  to schedule a follow-up OBGYN appointment: [facility] Address: [address] floor, [place], [place] [address] Phone: [phone]
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
## 2024-01-13 NOTE — Progress Notes (Signed)
   01/13/24 0900  Psych Admission Type (Psych Patients Only)  Admission Status Voluntary  Psychosocial Assessment  Patient Complaints None  Eye Contact Fair  Facial Expression Animated  Affect Appropriate to circumstance  Speech Logical/coherent  Interaction Assertive  Motor Activity Other (Comment) (WNL)  Appearance/Hygiene Unremarkable  Behavior Characteristics Cooperative;Appropriate to situation  Mood Pleasant  Thought Process  Coherency WDL  Content WDL  Delusions None reported or observed  Perception WDL  Hallucination None reported or observed  Judgment Limited  Confusion None  Danger to Self  Current suicidal ideation? Denies  Agreement Not to Harm Self Yes  Description of Agreement Verbal  Danger to Others  Danger to Others None reported or observed

## 2024-01-13 NOTE — Progress Notes (Signed)
 Pt discharged 01/13/24, pt is calm cooperative, med compliant, no behavioral issues at this time. Nurse returned all belongings to pt, pt given education and discharge instructions. Pt has no questions or concerns at time of discharge. Pt taken to front of facility to transport via taxi to home.

## 2024-01-13 NOTE — Transportation (Signed)
 01/13/2024  [name] DOB: 2000/10/02 MRN: [id number]   RIDER WAIVER AND RELEASE OF LIABILITY  For the purposes of helping with transportation needs, [facility] partners with outside transportation providers (taxi companies, [place], Catering manager.) to give [employer] patients or other approved people the choice of on-demand rides [employer]") to our buildings for non-emergency visits.  By using [employer], I, the person signing this document, on behalf of myself and/or any legal minors (in my care using the [employer]), agree:  Science writer given to me are supplied by independent, outside transportation providers who do not work for, or have any affiliation with, [employer]. [facility] is not a transportation company. [facility] has no control over the quality or safety of the rides I get using [employer]. [facility] has no control over whether any outside ride will happen on time or not. [facility] gives no guarantee on the reliability, quality, safety, or availability on any rides, or that no mistakes will happen. I know and accept that traveling by vehicle (car, truck, SVU, [place], bus, taxi, etc.) has risks of serious injuries such as disability, being paralyzed, and death. I know and agree the risk of using [employer] is mine alone, and not [employer]. Transport Services are provided "as is" and as are available. The transportation providers are in charge for all inspections and care of the vehicles used to provide these rides. I agree not to take legal action against [facility], its agents, employees, officers, directors, representatives, insurers, attorneys, assigns, successors, subsidiaries, and affiliates at any time for any reasons related directly or indirectly to using [employer]. I also agree not to take legal action against [facility] or its affiliates for any injury, death, or damage to property caused by or related to using  [employer]. I have read this Waiver and Release of Liability, and I understand the terms used in it and their legal meaning. This Waiver is freely and voluntarily given with the understanding that my right (or any legal minors) to legal action against  relating to [employer] is knowingly given up to use these services.   I attest that I read the Ride Waiver and Release of Liability to [name], gave Ms. Babiarz the opportunity to ask questions and answered the questions asked (if any). I affirm that [name] then provided consent for assistance with transportation.        Toshi Ishii, LCSWA

## 2024-01-13 NOTE — BHH Counselor (Signed)
 Adult Comprehensive Assessment  Patient ID: Alison Cummings, female   DOB: 2001-03-30, 23 y.o.   MRN: [id number]  Information Source: Information source: Patient  Current Stressors:  Patient states their primary concerns and needs for treatment are:: "I want to work on my depression" Patient states their goals for this hospitilization and ongoing recovery are:: "to work on my depressionAnimator / Learning stressors: None reported Employment / Job issues: None reported Family Relationships: None reported Surveyor, quantity / Lack of resources (include bankruptcy): None reported Housing / Lack of housing: None reported Physical health (include injuries & life threatening diseases): None reported Social relationships: None reported Substance abuse: None reported Bereavement / Loss: None reported  Living/Environment/Situation:  Living Arrangements: Non-relatives/Friends Living conditions (as described by patient or guardian): Patient reported living with her friend prior to hospitalization Who else lives in the home?: Patient reports she and her friend were living in the home. How long has patient lived in current situation?: "A few weeks" What is atmosphere in current home: Comfortable  Family History:  Marital status: Single Are you sexually active?: Yes What is your sexual orientation?: Heterosexual Has your sexual activity been affected by drugs, alcohol, medication, or emotional stress?: No Does patient have children?: No  Childhood History:  By whom was/is the patient raised?: Adoptive parents Additional childhood history information: None reported Description of patient's relationship with caregiver when they were a child: "Good with my adoptive mom" Patient's description of current relationship with people who raised him/her: "It's good with my adoptive mom" How were you disciplined when you got in trouble as a child/adolescent?: "I was grounded" Does patient have siblings?:  Yes Number of Siblings: 1 Description of patient's current relationship with siblings: Patient stated "My brother died by shooting." Did patient suffer any verbal/emotional/physical/sexual abuse as a child?: No Did patient suffer from severe childhood neglect?: No Has patient ever been sexually abused/assaulted/raped as an adolescent or adult?: No Was the patient ever a victim of a crime or a disaster?: No Witnessed domestic violence?: No Has patient been affected by domestic violence as an adult?: No  Education:  Highest grade of school patient has completed: High school Currently a student?: No Learning disability?: No  Employment/Work Situation:   Employment Situation: Unemployed Patient's Job has Been Impacted by Current Illness: No What is the Longest Time Patient has Held a Job?: N/A Where was the Patient Employed at that Time?: N/A Has Patient ever Been in the [employer]?: No  Financial Resources:   Surveyor, quantity resources: Sales executive, Media planner Does patient have a Lawyer or guardian?: No  Alcohol/Substance Abuse:   What has been your use of drugs/alcohol within the last 12 months?: Patient denied the use of drugs and and consumption of alcohol If attempted suicide, did drugs/alcohol play a role in this?: No Alcohol/Substance Abuse Treatment Hx: Denies past history If yes, describe treatment: Patient denies Has alcohol/substance abuse ever caused legal problems?: No  Social Support System:   Conservation officer, nature Support System: Fair Museum/gallery exhibitions officer System: Patient reported support consisting of friends and workers at [employer] Type of faith/religion: None reported How does patient's faith help to cope with current illness?: N/A  Leisure/Recreation:   Do You Have Hobbies?: No  Strengths/Needs:   What is the patient's perception of their strengths?: Patient declined to discuss Patient states they can use these personal strengths during their  treatment to contribute to their recovery: Patient declined to discuss Patient states these barriers may affect/interfere with their treatment:  None reported Patient states these barriers may affect their return to the community: None reported Other important information patient would like considered in planning for their treatment: None reported  Discharge Plan:   Currently receiving community mental health services: No Patient states concerns and preferences for aftercare planning are: "Go back to my friend's house" Patient states they will know when they are safe and ready for discharge when: "I don't know" Does patient have access to transportation?: No Does patient have financial barriers related to discharge medications?: No Patient description of barriers related to discharge medications: None reported Plan for no access to transportation at discharge: None reported Will patient be returning to same living situation after discharge?: Yes  Summary/Recommendations:   Summary and Recommendations (to be completed by the evaluator): Alison Cummings is a 23 year old female voluntarily admitted from [facility] ED at [facility] due to depression, suicidal thoughts with no reported plan, and hallucinations. Patient reported having concerns regarding her medication and needing an adjustment. Prior to this hospitalization, she reported living with her friend and plans to return once discharged. Patient denied the use of illicit, mood-altering substances including the consumption of alcohol. Urinary drug screen was negative for all illicit, mood-altering substances. Patient denied current engagement in outpatient mental health services but is interested in therapy and medication management.While here, Alison Cummings can benefit from crisis stabilization, medication management, therapeutic milieu, and referrals for services.   Alison Cummings M Alison Cummings, LCSWA  01/13/2024

## 2024-01-14 NOTE — BHH Group Notes (Signed)

## 2024-01-18 ENCOUNTER — Observation Stay (HOSPITAL_COMMUNITY)
Admission: EM | Admit: 2024-01-18 | Discharge: 2024-01-20 | Disposition: A | Discharge status: Home / Self Care | Attending: Student | Admitting: Student

## 2024-01-18 DIAGNOSIS — Z59812 Housing instability, housed, homelessness in past 12 months: Secondary | ICD-10-CM | POA: Insufficient documentation

## 2024-01-18 DIAGNOSIS — F418 Other specified anxiety disorders: Secondary | ICD-10-CM | POA: Insufficient documentation

## 2024-01-18 DIAGNOSIS — D509 Iron deficiency anemia, unspecified: Secondary | ICD-10-CM | POA: Insufficient documentation

## 2024-01-18 DIAGNOSIS — E0590 Thyrotoxicosis, unspecified without thyrotoxic crisis or storm: Secondary | ICD-10-CM | POA: Diagnosis not present

## 2024-01-18 DIAGNOSIS — E669 Obesity, unspecified: Secondary | ICD-10-CM | POA: Diagnosis present

## 2024-01-18 DIAGNOSIS — R0602 Shortness of breath: Secondary | ICD-10-CM | POA: Diagnosis not present

## 2024-01-18 DIAGNOSIS — F39 Unspecified mood [affective] disorder: Secondary | ICD-10-CM | POA: Diagnosis present

## 2024-01-18 DIAGNOSIS — F32A Depression, unspecified: Secondary | ICD-10-CM | POA: Diagnosis present

## 2024-01-18 DIAGNOSIS — F17220 Nicotine dependence, chewing tobacco, uncomplicated: Secondary | ICD-10-CM | POA: Diagnosis not present

## 2024-01-18 DIAGNOSIS — F419 Anxiety disorder, unspecified: Secondary | ICD-10-CM | POA: Diagnosis present

## 2024-01-18 DIAGNOSIS — R42 Dizziness and giddiness: Secondary | ICD-10-CM | POA: Diagnosis not present

## 2024-01-18 DIAGNOSIS — Z6835 Body mass index (BMI) 35.0-35.9, adult: Secondary | ICD-10-CM | POA: Diagnosis not present

## 2024-01-18 DIAGNOSIS — Z59819 Housing instability, housed unspecified: Secondary | ICD-10-CM | POA: Diagnosis present

## 2024-01-19 ENCOUNTER — Emergency Department (HOSPITAL_COMMUNITY)

## 2024-01-19 ENCOUNTER — Encounter (HOSPITAL_COMMUNITY): Payer: Self-pay

## 2024-01-19 ENCOUNTER — Other Ambulatory Visit: Payer: Self-pay

## 2024-01-19 DIAGNOSIS — E669 Obesity, unspecified: Secondary | ICD-10-CM | POA: Diagnosis not present

## 2024-01-19 DIAGNOSIS — D509 Iron deficiency anemia, unspecified: Secondary | ICD-10-CM | POA: Diagnosis not present

## 2024-01-19 DIAGNOSIS — E0590 Thyrotoxicosis, unspecified without thyrotoxic crisis or storm: Secondary | ICD-10-CM | POA: Diagnosis not present

## 2024-01-19 DIAGNOSIS — Z59819 Housing instability, housed unspecified: Secondary | ICD-10-CM | POA: Diagnosis not present

## 2024-01-19 DIAGNOSIS — F39 Unspecified mood [affective] disorder: Secondary | ICD-10-CM | POA: Diagnosis not present

## 2024-01-19 DIAGNOSIS — F32A Depression, unspecified: Secondary | ICD-10-CM | POA: Diagnosis not present

## 2024-01-19 DIAGNOSIS — R0602 Shortness of breath: Secondary | ICD-10-CM | POA: Diagnosis not present

## 2024-01-19 DIAGNOSIS — R42 Dizziness and giddiness: Secondary | ICD-10-CM | POA: Diagnosis not present

## 2024-01-19 DIAGNOSIS — F419 Anxiety disorder, unspecified: Secondary | ICD-10-CM | POA: Diagnosis not present

## 2024-01-19 LAB — URINALYSIS, ROUTINE W REFLEX MICROSCOPIC
Bacteria, UA: NONE SEEN
Bilirubin Urine: NEGATIVE
Glucose, UA: NEGATIVE mg/dL
Hgb urine dipstick: NEGATIVE
Ketones, ur: NEGATIVE mg/dL
Leukocytes,Ua: NEGATIVE
Nitrite: NEGATIVE
Protein, ur: NEGATIVE mg/dL
Specific Gravity, Urine: 1.027 (ref 1.005–1.030)
pH: 5 (ref 5.0–8.0)

## 2024-01-19 LAB — COMPREHENSIVE METABOLIC PANEL WITH GFR
ALT: 53 U/L — ABNORMAL HIGH (ref 0–44)
AST: 32 U/L (ref 15–41)
Albumin: 3.3 g/dL — ABNORMAL LOW (ref 3.5–5.0)
Alkaline Phosphatase: 101 U/L (ref 38–126)
Anion gap: 11 (ref 5–15)
BUN: 14 mg/dL (ref 6–20)
CO2: 20 mmol/L — ABNORMAL LOW (ref 22–32)
Calcium: 9.2 mg/dL (ref 8.9–10.3)
Chloride: 109 mmol/L (ref 98–111)
Creatinine, Ser: 0.57 mg/dL (ref 0.44–1.00)
GFR, Estimated: >60 mL/min (ref >60)
Glucose, Bld: 83 mg/dL (ref 70–99)
Potassium: 3.6 mmol/L (ref 3.5–5.1)
Sodium: 140 mmol/L (ref 135–145)
Total Bilirubin: 0.6 mg/dL (ref 0.0–1.2)
Total Protein: 6.9 g/dL (ref 6.5–8.1)

## 2024-01-19 LAB — VITAMIN B12: Vitamin B-12: 478 pg/mL (ref 180–914)

## 2024-01-19 LAB — RETICULOCYTES
Immature Retic Fract: 6.4 % (ref 2.3–15.9)
RBC.: 4.54 MIL/uL (ref 3.87–5.11)
Retic Count, Absolute: 39 10*3/uL (ref 19.0–186.0)
Retic Ct Pct: 0.9 % (ref 0.4–3.1)

## 2024-01-19 LAB — CBC
HCT: 34.2 % — ABNORMAL LOW (ref 36.0–46.0)
Hemoglobin: 10.5 g/dL — ABNORMAL LOW (ref 12.0–15.0)
MCH: 22.8 pg — ABNORMAL LOW (ref 26.0–34.0)
MCHC: 30.7 g/dL (ref 30.0–36.0)
MCV: 74.3 fL — ABNORMAL LOW (ref 80.0–100.0)
Platelets: 182 10*3/uL (ref 150–400)
RBC: 4.6 MIL/uL (ref 3.87–5.11)
RDW: 15.8 % — ABNORMAL HIGH (ref 11.5–15.5)
WBC: 5.5 10*3/uL (ref 4.0–10.5)
nRBC: 0 % (ref 0.0–0.2)

## 2024-01-19 LAB — IRON AND TIBC
Iron: 50 ug/dL (ref 28–170)
Saturation Ratios: 19 % (ref 10.4–31.8)
TIBC: 263 ug/dL (ref 250–450)
UIBC: 213 ug/dL

## 2024-01-19 LAB — D-DIMER, QUANTITATIVE: D-Dimer, Quant: 2.12 ug{FEU}/mL — ABNORMAL HIGH (ref 0.00–0.50)

## 2024-01-19 LAB — TROPONIN I (HIGH SENSITIVITY): Troponin I (High Sensitivity): <2 ng/L (ref <18)

## 2024-01-19 LAB — FERRITIN: Ferritin: 66 ng/mL (ref 11–307)

## 2024-01-19 LAB — MAGNESIUM: Magnesium: 1.8 mg/dL (ref 1.7–2.4)

## 2024-01-19 LAB — TSH: TSH: <0.01 u[IU]/mL — ABNORMAL LOW (ref 0.350–4.500)

## 2024-01-19 LAB — T4, FREE: Free T4: >5.5 ng/dL — ABNORMAL HIGH (ref 0.61–1.12)

## 2024-01-19 LAB — FOLATE: Folate: 19.2 ng/mL (ref >5.9)

## 2024-01-19 LAB — HCG, SERUM, QUALITATIVE: Preg, Serum: NEGATIVE

## 2024-01-19 MED ORDER — LORAZEPAM 2 MG/ML IJ SOLN: 0.5000 mg | INTRAMUSCULAR | Status: DC | PRN

## 2024-01-19 MED ORDER — ATENOLOL 25 MG PO TABS
50.0000 mg | ORAL_TABLET | Freq: Every day | ORAL | Status: DC
  Administered 2024-01-19 – 2024-01-20 (×2): 50 mg via ORAL
  Filled 2024-01-19 (×2): qty 2

## 2024-01-19 MED ORDER — ENOXAPARIN SODIUM 40 MG/0.4ML IJ SOSY
40.0000 mg | PREFILLED_SYRINGE | Freq: Every day | INTRAMUSCULAR | Status: DC
  Filled 2024-01-19: qty 0.4

## 2024-01-19 MED ORDER — SODIUM CHLORIDE 0.9% FLUSH
3.0000 mL | Freq: Two times a day (BID) | INTRAVENOUS | Status: DC
  Administered 2024-01-19 – 2024-01-20 (×3): 3 mL via INTRAVENOUS

## 2024-01-19 MED ORDER — METHIMAZOLE 10 MG PO TABS
20.0000 mg | ORAL_TABLET | Freq: Two times a day (BID) | ORAL | Status: DC
  Administered 2024-01-19 – 2024-01-20 (×2): 20 mg via ORAL
  Filled 2024-01-19 (×3): qty 2

## 2024-01-19 MED ORDER — NICOTINE 14 MG/24HR TD PT24
14.0000 mg | MEDICATED_PATCH | Freq: Every day | TRANSDERMAL | Status: DC
  Administered 2024-01-19 – 2024-01-20 (×2): 14 mg via TRANSDERMAL
  Filled 2024-01-19 (×2): qty 1

## 2024-01-19 MED ORDER — METOPROLOL SUCCINATE ER 25 MG PO TB24: 50.0000 mg | ORAL_TABLET | Freq: Every day | ORAL | Status: DC

## 2024-01-19 MED ORDER — ACETAMINOPHEN 325 MG PO TABS: 650.0000 mg | ORAL_TABLET | Freq: Four times a day (QID) | ORAL | Status: DC | PRN

## 2024-01-19 MED ORDER — ATENOLOL 25 MG PO TABS: 50.0000 mg | ORAL_TABLET | Freq: Every day | ORAL | Status: DC

## 2024-01-19 MED ORDER — ARIPIPRAZOLE 10 MG PO TABS
5.0000 mg | ORAL_TABLET | Freq: Every day | ORAL | Status: DC
  Administered 2024-01-19 – 2024-01-20 (×2): 5 mg via ORAL
  Filled 2024-01-19 (×2): qty 1

## 2024-01-19 MED ORDER — SODIUM CHLORIDE 0.9 % IV BOLUS
1000.0000 mL | Freq: Once | INTRAVENOUS | Status: AC
  Administered 2024-01-19: 1000 mL via INTRAVENOUS

## 2024-01-19 MED ORDER — LORAZEPAM 1 MG PO TABS: 1.0000 mg | ORAL_TABLET | Freq: Once | ORAL | Status: DC

## 2024-01-19 MED ORDER — ALBUTEROL SULFATE (2.5 MG/3ML) 0.083% IN NEBU: 2.5000 mg | INHALATION_SOLUTION | RESPIRATORY_TRACT | Status: DC | PRN

## 2024-01-19 MED ORDER — ONDANSETRON HCL 4 MG/2ML IJ SOLN: 4.0000 mg | Freq: Four times a day (QID) | INTRAMUSCULAR | Status: DC | PRN

## 2024-01-19 MED ORDER — ACETAMINOPHEN 650 MG RE SUPP: 650.0000 mg | Freq: Four times a day (QID) | RECTAL | Status: DC | PRN

## 2024-01-19 MED ORDER — MELATONIN 3 MG PO TABS: 3.0000 mg | ORAL_TABLET | Freq: Every evening | ORAL | Status: DC | PRN

## 2024-01-19 MED ORDER — IOHEXOL 350 MG/ML SOLN
75.0000 mL | Freq: Once | INTRAVENOUS | Status: AC | PRN
  Administered 2024-01-19: 75 mL via INTRAVENOUS

## 2024-01-19 MED ORDER — HYDROXYZINE HCL 25 MG PO TABS: 25.0000 mg | ORAL_TABLET | Freq: Four times a day (QID) | ORAL | Status: DC | PRN

## 2024-01-19 MED ORDER — ESCITALOPRAM OXALATE 10 MG PO TABS
10.0000 mg | ORAL_TABLET | Freq: Every day | ORAL | Status: DC
  Administered 2024-01-19 – 2024-01-20 (×2): 10 mg via ORAL
  Filled 2024-01-19 (×2): qty 1

## 2024-01-19 MED ORDER — METHIMAZOLE 10 MG PO TABS
10.0000 mg | ORAL_TABLET | ORAL | Status: AC
  Administered 2024-01-19: 10 mg via ORAL
  Filled 2024-01-19: qty 1

## 2024-01-19 NOTE — Progress Notes (Signed)
   01/19/24 0847  Assess: MEWS Score  Temp 98.4 F (36.9 C)  BP 123/81  MAP (mmHg) 93  Pulse Rate (!) 121  Resp 17  Level of Consciousness Alert  SpO2 100 %  O2 Device Room Air  Assess: MEWS Score  MEWS Temp 0  MEWS Systolic 0  MEWS Pulse 2  MEWS RR 0  MEWS LOC 0  MEWS Score 2  MEWS Score Color Yellow  Assess: if the MEWS score is Yellow or Red  Were vital signs accurate and taken at a resting state? Yes  Does the patient meet 2 or more of the SIRS criteria? No  MEWS guidelines implemented  Yes, yellow  Treat  MEWS Interventions Considered administering scheduled or prn medications/treatments as ordered  Take Vital Signs  Increase Vital Sign Frequency  Yellow: Q2hr x1, continue Q4hrs until patient remains green for 12hrs  Escalate  MEWS: Escalate Yellow: Discuss with charge nurse and consider notifying provider and/or RRT  Notify: Charge Nurse/RN  Name of Charge Nurse/RN Notified Lovena Rubinstein, RN  Provider Notification  Provider Name/Title Donneta Gaines  Date Provider Notified 01/19/24  Time Provider Notified [phone]  Method of Notification Page  Notification Reason Other (Comment) (yellow MEWS)  Provider response Other (Comment) (atenolol  was given in ED @0826 )  Date of Provider Response 01/19/24  Time of Provider Response 0850  Assess: SIRS CRITERIA  SIRS Temperature  0  SIRS Respirations  0  SIRS Pulse 1  SIRS WBC 0  SIRS Score Sum  1

## 2024-01-19 NOTE — Progress Notes (Signed)
   01/19/24 1403  Orthostatic Lying   BP- Lying 115/72  Pulse- Lying 108  Orthostatic Sitting  BP- Sitting 125/73  Pulse- Sitting 102  Orthostatic Standing at 0 minutes  BP- Standing at 0 minutes 128/72  Pulse- Standing at 0 minutes 102  Orthostatic Standing at 3 minutes  BP- Standing at 3 minutes 119/80  Pulse- Standing at 3 minutes 105

## 2024-01-19 NOTE — ED Triage Notes (Signed)
 Pt arrived from home c/o dizziness all day LKW 2300 01/17/2024. Pt hinks it may be her thyroid  medication causing the dizziness

## 2024-01-19 NOTE — Progress Notes (Signed)
   01/19/24 1212  Vitals  Temp 98.2 F (36.8 C)  Temp Source Oral  BP 123/75  MAP (mmHg) 89  BP Location Right Arm  BP Method Automatic  Patient Position (if appropriate) Lying  Pulse Rate (!) 106  Pulse Rate Source Monitor  Resp 17  Level of Consciousness  Level of Consciousness Alert  MEWS COLOR  MEWS Score Color Green  Oxygen Therapy  SpO2 100 %  O2 Device Room Air  Pain Assessment  Pain Scale 0-10  Pain Score 0  MEWS Score  MEWS Temp 0  MEWS Systolic 0  MEWS Pulse 1  MEWS RR 0  MEWS LOC 0  MEWS Score 1

## 2024-01-19 NOTE — Progress Notes (Signed)
Pt refused Lovenox injection.

## 2024-01-19 NOTE — ED Provider Notes (Cosign Needed Addendum)
 [place] EMERGENCY DEPARTMENT AT [facility] Provider Note   CSN: [id number] Arrival date & time: 01/18/24  2338     Patient presents with: Dizziness   Alison Cummings is a 23 y.o. female with medical history of hyperthyroid, hypertension, ADHD, adjustment disorder.  Patient presents to ED for evaluation of dizziness, shortness of breath.  She presents to the ED for evaluation of 2 days of dizziness described as floating.  She reports that her dizziness comes and goes, she is unsure of alleviating or aggravating factors.  She reports compliance on her medication for hyperthyroidism.  She denies headache, blurred vision, neck pain, nausea or vomiting.  She denies chest pain but does endorse shortness of breath.  She denies that her shortness of breath is worse with ambulation.  She was admitted to the hospital at the beginning of the month for 2 days for thyrotoxicosis.  At that time, there was question about compliance.  The patient reports that ever since discharge she has been compliant on her hyperthyroid medication.   Dizziness Associated symptoms: shortness of breath   Associated symptoms: no chest pain, no nausea, no vomiting and no weakness        Prior to Admission medications   Medication Sig Start Date End Date Taking? Authorizing Provider  ARIPiprazole  (ABILIFY ) 5 MG tablet Take 1 tablet (5 mg total) by mouth daily. 01/13/24  Yes Pashayan, Knute Perla, DO  atenolol  (TENORMIN ) 50 MG tablet Take 1 tablet (50 mg total) by mouth daily. 01/14/24  Yes Pashayan, Knute Perla, DO  escitalopram  (LEXAPRO ) 10 MG tablet Take 1 tablet (10 mg total) by mouth daily. 01/13/24  Yes Pashayan, Knute Perla, DO  hydrOXYzine  (ATARAX ) 25 MG tablet Take 1 tablet (25 mg total) by mouth every 6 (six) hours as needed for anxiety. 01/13/24  Yes Pashayan, Knute Perla, DO  methimazole  (TAPAZOLE ) 10 MG tablet Take 2 tablets (20 mg total) by mouth 2 (two) times daily. 01/13/24  Yes Pashayan, Knute Perla, DO    Allergies: Apple, Cherry, Mangifera indica, and Ibuprofen     Review of Systems  Constitutional:  Negative for fever.  Respiratory:  Positive for shortness of breath.   Cardiovascular:  Negative for chest pain.  Gastrointestinal:  Negative for abdominal pain, nausea and vomiting.  Neurological:  Positive for dizziness and light-headedness. Negative for syncope and weakness.    Updated Vital Signs BP 123/72 (BP Location: Right Arm)   Pulse (!) 112   Temp 98.5 F (36.9 C) (Oral)   Resp 16   Ht 5' 2 (1.575 m)   Wt 90.3 kg   LMP 12/14/2023 (Approximate)   SpO2 100%   Breastfeeding Unknown   BMI 36.40 kg/m   Physical Exam Vitals and nursing note reviewed.  Constitutional:      General: She is not in acute distress.    Appearance: She is well-developed.  HENT:     Head: Normocephalic and atraumatic.   Eyes:     Conjunctiva/sclera: Conjunctivae normal.    Cardiovascular:     Rate and Rhythm: Regular rhythm. Tachycardia present.     Heart sounds: No murmur heard. Pulmonary:     Effort: Pulmonary effort is normal. No respiratory distress.     Breath sounds: Normal breath sounds.  Abdominal:     Palpations: Abdomen is soft.     Tenderness: There is no abdominal tenderness.   Musculoskeletal:        General: No swelling.     Cervical back: Neck  supple.   Skin:    General: Skin is warm and dry.     Capillary Refill: Capillary refill takes less than 2 seconds.   Neurological:     Mental Status: She is alert and oriented to person, place, and time. Mental status is at baseline.     Comments: No focal deficits on exam.  Alert and oriented x 4.  Psychiatric:        Mood and Affect: Mood normal.     (all labs ordered are listed, but only abnormal results are displayed) Labs Reviewed  COMPREHENSIVE METABOLIC PANEL WITH GFR - Abnormal; Notable for the following components:      Result Value   CO2 20 (*)    Albumin 3.3 (*)    ALT 53 (*)    All other  components within normal limits  CBC - Abnormal; Notable for the following components:   Hemoglobin 10.5 (*)    HCT 34.2 (*)    MCV 74.3 (*)    MCH 22.8 (*)    RDW 15.8 (*)    All other components within normal limits  TSH - Abnormal; Notable for the following components:   TSH <0.010 (*)    All other components within normal limits  T4, FREE - Abnormal; Notable for the following components:   Free T4 >5.50 (*)    All other components within normal limits  D-DIMER, QUANTITATIVE (NOT AT [facility]) - Abnormal; Notable for the following components:   D-Dimer, Quant 2.12 (*)    All other components within normal limits  URINALYSIS, ROUTINE W REFLEX MICROSCOPIC  HCG, SERUM, QUALITATIVE  MAGNESIUM   T3, FREE    EKG: None  Radiology: CT Angio Chest PE W and/or Wo Contrast Result Date: 01/19/2024 EXAM: CTA of the Chest with contrast for PE 01/19/2024 02:48:12 AM TECHNIQUE: CTA of the chest was performed after the administration of intravenous contrast. Multiplanar reformatted images are provided for review. MIP images are provided for review. Automated exposure control, iterative reconstruction, and/or weight based adjustment of the mA/kV was utilized to reduce the radiation dose to as low as reasonably achievable. COMPARISON: Chest radiograph earlier today. CLINICAL HISTORY: Pulmonary embolism (PE) suspected, high prob. Chief complaints; Dizziness. FINDINGS: PULMONARY ARTERIES: Pulmonary arteries are adequately opacified for evaluation. No evidence of pulmonary embolism. Main pulmonary artery is normal in caliber. MEDIASTINUM: The heart and pericardium demonstrate no acute abnormality. There is no acute abnormality of the thoracic aorta. LYMPH NODES: No mediastinal, hilar or axillary lymphadenopathy. LUNGS AND PLEURA: 4 mm subpleural left lower lobe nodule (image 83), benign. Given the patient's age, no follow-up is recommended. No focal consolidation or pulmonary edema. No pleural effusion or  pneumothorax. UPPER ABDOMEN: Limited images of the upper abdomen are unremarkable. SOFT TISSUES AND BONES: No acute bone or soft tissue abnormality. IMPRESSION: 1. No evidence of pulmonary embolism. Electronically signed by: Zadie Herter MD 01/19/2024 02:51 AM EDT RP Workstation: [id number]   DG Chest 2 View Result Date: 01/19/2024 CLINICAL DATA:  Shortness of breath and dizziness. EXAM: CHEST - 2 VIEW COMPARISON:  Portable chest 01/08/2024 FINDINGS: The heart size and mediastinal contours are within normal limits. Both lungs are clear. The visualized skeletal structures are unremarkable. IMPRESSION: No active cardiopulmonary disease. Electronically Signed   By: Denman Fischer M.D.   On: 01/19/2024 02:02     Procedures   Medications Ordered in the ED  acetaminophen  (TYLENOL ) tablet 650 mg (has no administration in time range)    Or  acetaminophen  (TYLENOL ) suppository  650 mg (has no administration in time range)  melatonin tablet 3 mg (has no administration in time range)  ondansetron  (ZOFRAN ) injection 4 mg (has no administration in time range)  methimazole  (TAPAZOLE ) tablet 20 mg (has no administration in time range)  atenolol  (TENORMIN ) tablet 50 mg (has no administration in time range)  LORazepam  (ATIVAN ) injection 0.5 mg (has no administration in time range)  sodium chloride  0.9 % bolus 1,000 mL (0 mLs Intravenous Stopped 01/19/24 0245)  methimazole  (TAPAZOLE ) tablet 10 mg (10 mg Oral Given 01/19/24 0245)  iohexol (OMNIPAQUE) 350 MG/ML injection 75 mL (75 mLs Intravenous Contrast Given 01/19/24 0248)    Medical Decision Making Amount and/or Complexity of Data Reviewed Labs: ordered. Radiology: ordered.  Risk Prescription drug management. Decision regarding hospitalization.   23 year old female presents for evaluation.  Please see HPI for further details.  On examination the patient is afebrile and tachycardic to 118.  Her lung sounds are clear bilaterally, she is not hypoxic.   Her abdomen is soft and compressible.  Her neurological examination is at baseline.  No edema to bilateral lower extremities.  Patient recently admitted and discharged in the hospital secondary to thyrotoxicosis.  Suspect this is contributing to patient's symptoms tonight.  She states that she is compliant on methimazole  however on further questioning reports that she only takes morning dose and often misses the evening dose.  Apparently on last admission, the patient did admit to noncompliance.  Will assess with CBC, CMP, TSH, T3, T4, and exam, urinalysis, D-dimer due to shortness of breath.  Chest x-ray also collected.  EKG collected.  CBC without leukocytosis, hemoglobin 10.5.  Patient denies any blood in stool or other blood loss.  TSH undetectable, less than 0.010.  T4 is elevated greater than 5.50.  Magnesium  1.8.  Pregnancy test negative.  Metabolic panel without electrolyte derangement, slightly decreased albumin 3.3, no elevated anion gap.  Patient D-dimer elevated to 2.12.  CTA ordered at this time.  Chest x-ray unremarkable.  CTA negative for PE.  Patient given 1 L of fluid, methimazole  10 mg, 50 mg Toprol .  Patient pulse rate currently 112.  Patient will require admission to the hospital at this time for thyrotoxicosis.  Suspect patient noncompliant on medications at home.  Discussed with Dr. Gwen Lek who agrees to admit patient to the hospital service.  Stable on admission.    Final diagnoses:  Thyrotoxicosis without thyroid  storm, unspecified thyrotoxicosis type    ED Discharge Orders     None            Adel Aden, PA-C 01/19/24 0421    Ballard Bongo, MD 01/21/24 0001

## 2024-01-19 NOTE — Progress Notes (Signed)
 Breakfast tray ordered for pt

## 2024-01-19 NOTE — ED Notes (Signed)
 Patient transported to X-ray

## 2024-01-19 NOTE — Progress Notes (Signed)
   01/19/24 1351  Assess: MEWS Score  Temp 98.4 F (36.9 C)  BP 117/79  MAP (mmHg) 90  Pulse Rate (!) 111  Resp 17  Level of Consciousness Alert  SpO2 100 %  O2 Device Room Air  Assess: MEWS Score  MEWS Temp 0  MEWS Systolic 0  MEWS Pulse 2  MEWS RR 0  MEWS LOC 0  MEWS Score 2  MEWS Score Color Yellow  Assess: if the MEWS score is Yellow or Red  Were vital signs accurate and taken at a resting state? Yes  Does the patient meet 2 or more of the SIRS criteria? No  MEWS guidelines implemented  Yes, yellow  Treat  MEWS Interventions Considered administering scheduled or prn medications/treatments as ordered  Take Vital Signs  Increase Vital Sign Frequency  Yellow: Q2hr x1, continue Q4hrs until patient remains green for 12hrs  Escalate  MEWS: Escalate Yellow: Discuss with charge nurse and consider notifying provider and/or RRT  Notify: Charge Nurse/RN  Name of Charge Nurse/RN Notified Ben,RN  Provider Notification  Provider Name/Title Rondell Smith,MD  Date Provider Notified 01/19/24  Time Provider Notified 1402  Method of Notification Page  Notification Reason Other (Comment) (yellow EWS)  Provider response No new orders  Date of Provider Response 01/19/24  Time of Provider Response 1403  Assess: SIRS CRITERIA  SIRS Temperature  0  SIRS Respirations  0  SIRS Pulse 1  SIRS WBC 0  SIRS Score Sum  1

## 2024-01-19 NOTE — Progress Notes (Signed)
 Pt arrived to 6 north room 30 alert and oriented x4. Pt ambulated from stretcher to bed with standby assist. Pt became very dizzy with ambulation. Bed alarm put on. Pain level 0/10. Bed in lowest position. Call light in reach. All needs met at this time.

## 2024-01-19 NOTE — H&P (Signed)
 History and Physical    Patient: Alison Cummings HYQ:[id number] DOB: [birth year] DOA: 01/18/2024 DOS: the patient was seen and examined on 01/19/2024 PCP: Paseda, Folashade R, FNP  Patient coming from: Home  Chief Complaint:  Chief Complaint  Patient presents with   Dizziness   HPI: Alison Cummings is a 23 y.o. female with medical history significant of hypertension hyperthyroidism, ADHD, anxiety,  and depression presents with complaints of dizziness and chest pain.  Dizziness began suddenly on Sunday morning upon waking, described as a sensation of 'everything moving.' She has been taking methimazole , but only have been taking one dose instead of the prescribed two doses daily. She also takes atenolol  and reports her blood pressure has been good.   Chest pain is localized to the left side, accompanied by shortness of breath. No recent cough, fever, chills, nausea, or vomiting.  She has a history of hyperthyroidism and was recently discharged from the hospital with an increased dosage of methimazole , which she believes was 20 mg twice daily, but the discharge instructions indicate 20 mg twice daily. She is also on Lexapro , Abilify , and hydroxyzine .  Loss of appetite and weight loss of approximately five pounds since her last hospital visit a week ago.  She smokes six cigarettes a day and one to two Black and Mild cigars daily.  In the ED patient was noted to be tachycardic with all other vital signs relatively maintained.  Labs noted TSH <0.01, free T4 >5.5, D-dimer 2.12, hemoglobin 10.5, and ALT 53.  Urinalysis showed no signs for infection. Urine pregnancy screen was negative.  Chest x-ray was clear.  CT angiogram was negative for any signs for a pulmonary embolism.  Patient had been given methimazole  10 mg p.o., and 1 L of normal saline IV fluids.   Review of Systems: As mentioned in the history of present illness. All other systems reviewed and are negative. 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 Her smoking use included cigarettes. She has never used smokeless tobacco. She reports that she does not currently use drugs after having used the following drugs: Marijuana. She reports that she does not drink alcohol.  Allergies  Allergen Reactions   Apple Itching, Swelling and Other (See Comments)    Tongue swells and throat itches   Cherry Itching, Swelling and Other (See Comments)    Tongue swells and throat itches   Mangifera Indica    Ibuprofen  Palpitations and Swelling    Of feet    Family History  Problem Relation Age of Onset   Schizophrenia Mother    High blood pressure Mother    Hyperlipidemia Mother    High blood pressure Father    ADD / ADHD Father    Bipolar disorder Father    Stroke Father    Diabetes Sister    Hyperlipidemia Sister     Prior to Admission  medications   Medication Sig Start Date End Date Taking? Authorizing Provider  ARIPiprazole  (ABILIFY ) 5 MG tablet Take 1 tablet (5 mg total) by mouth daily. 01/13/24  Yes Pashayan, Knute Perla, DO  atenolol  (TENORMIN ) 50 MG tablet Take 1 tablet (50 mg total) by mouth daily. 01/14/24  Yes Pashayan, Knute Perla, DO  escitalopram  (LEXAPRO ) 10 MG tablet Take 1 tablet (10 mg total) by mouth daily. 01/13/24  Yes Pashayan, Knute Perla, DO  hydrOXYzine  (ATARAX ) 25 MG tablet Take 1 tablet (25 mg total) by mouth every 6 (six) hours as needed for  anxiety. 01/13/24  Yes Pashayan, Knute Perla, DO  methimazole  (TAPAZOLE ) 10 MG tablet Take 2 tablets (20 mg total) by mouth 2 (two) times daily. 01/13/24  Yes Basilia Bosworth, DO    Physical Exam: Vitals:   01/19/24 0011 01/19/24 0012 01/19/24 0348 01/19/24 0437  BP:   123/72 (!) 119/59  Pulse:   (!) 112 (!) 108  Resp:   16 20  Temp:   98.5 F (36.9 C)   TempSrc:   Oral   SpO2: 98%  100% 100%  Weight:  90.3 kg    Height:  5' 2 (1.575 m)      Constitutional: Young female currently in NAD, calm, comfortable Eyes: PERRL, lids and conjunctivae normal ENMT: Mucous membranes are moist. Posterior pharynx clear of any exudate or lesions.Normal dentition.  Neck: normal, supple, Respiratory: clear to auscultation bilaterally, no wheezing, no crackles. Normal respiratory effort. No accessory muscle use.  Cardiovascular: Tachycardic. No extremity edema. 2+ pedal pulses. No carotid bruits.  Abdomen: no tenderness, no masses palpated. Bowel sounds positive.  Musculoskeletal: no clubbing / cyanosis. No joint deformity upper and lower extremities. Good ROM, no contractures. Normal muscle tone.  Skin: no rashes, lesions, ulcers. No induration Neurologic: CN 2-12 grossly intact.  Strength 5/5 in all 4.  Psychiatric: Normal judgment and insight. Alert and oriented x 3. Normal mood.   Data Reviewed:  reviewed labs, imaging, and pertinent records as documented.  Assessment and Plan:  Thyrotoxicosis/hyperthyroidism Acute.  Presented with complaints of dizziness, short of breath, and weight loss of approximately 5 pounds..  Review of records note that patient's TSH level has been undetectable since at least 04/10/2023.  Free T4 today noted to be greater than 5.5.  She admits to not taking her methimazole  twice daily as prescribed during last hospitalization just a couple days ago. - Admit to medical telemetry bed - Check orthostatic vital signs - Continue atenolol  and methimazole  20 mg twice  daily.  Stressed need of compliance with regimen - Will need outpatient follow-up with endocrinology  Microcytic anemia Acute on chronic.  On admission hemoglobin noted to be 10.9 with low MCV and MCH.  Hemoglobin had been noted to be around 12 earlier this month. - Add-on anemia panel - Recheck CBC tomorrow morning  Mood disorder Anxiety and depression -Continue Abilify  and Lexapro   Tobacco abuse Patient reports smoking Black and milds and cigarettes.   - Nicotine patch offered - Continue to counsel need of cessation of tobacco use  Obesity BMI 35.87 kg/m  Housing insecurity Patient sister makes note that the patient has been homeless for over a year now. - Transitions of care consulted  DVT prophylaxis: Lovenox  Advance Care Planning:   Code Status: Full Code    Consults: None  Family Communication: Sister updated over the phone.  Severity of Illness: The appropriate patient status for this patient is OBSERVATION. Observation status is judged to be reasonable  and necessary in order to provide the required intensity of service to ensure the patient's safety. The patient's presenting symptoms, physical exam findings, and initial radiographic and laboratory data in the context of their medical condition is felt to place them at decreased risk for further clinical deterioration. Furthermore, it is anticipated that the patient will be medically stable for discharge from the hospital within 2 midnights of admission.   Author: Lena Qualia, MD 01/19/2024 7:17 AM  For on call review [email or web address].

## 2024-01-19 NOTE — Progress Notes (Signed)
  Carryover admission to the Day Admitter.  I discussed this case with the EDP, Parke Boll, PA.   Per these discussions:   This is a 23 year old female with history of Graves' disease which with recent hospitalization for thyrotoxicosis in the setting of suboptimal compliance with her outpatient medical management, who is being admitted this evening for thyrotoxicosis, without clinical evidence of thyroid  storm, after presenting with shortness of breath as well as dizziness over the last few days.  Not associate any presyncope, syncope, or fall.  Patient recently admitted to the hospitalist service on 01/08/2024 to 01/10/2024 for thyrotoxicosis in the setting of suboptimal compliance with outpatient methimazole  and beta-blocker.  Was subsequently discharged on 01/10/2024 on methimazole  20 mg p.o. twice daily as well as atenolol  50 mg p.o. daily.   Patient presents back to the emergency department this evening complaining of shortness of breath over the last few days, in the absence of any fever or chest discomfort.  She conveys suboptimal compliance with her outpatient methimazole  and atenolol  in the interval since being discharged, conveying that she has frequently missed the second dose of her twice daily methimazole  over the course of the last week.  No evidence of acute focal neurologic deficits.  Vital signs in the ED today were notable for afebrile, with temperature max 98.7, heart rates in the 1 teens, systolic blood pressures in the 120s, respiratory rate 16-18, and oxygen saturation 98 to 100% on room air.  Labs today were notable for free T4 greater than 5.5 as well as undetectable TSH.  CTA chest showed no evidence of acute cardiopulmonary process, including no evidence of acute pulmonary embolism.  In the ED this evening, she received a dose of methimazole .   I have placed an order for observation to med/tele for further evaluation management of the above.  I have placed some additional  preliminary admit orders via the adult multi-morbid admission order set. I have also ordered resumption of her home methimazole  20 mg p.o. twice daily, and have resumed her home atenolol  50 mg p.o. daily.  I have ordered as needed IV Ativan  for anxiety and added on a magnesium  level.    Camelia Cavalier, DO Hospitalist

## 2024-01-19 NOTE — ED Notes (Addendum)
 Patient transported to CT scan .

## 2024-01-20 ENCOUNTER — Observation Stay (HOSPITAL_BASED_OUTPATIENT_CLINIC_OR_DEPARTMENT_OTHER)

## 2024-01-20 ENCOUNTER — Other Ambulatory Visit (HOSPITAL_COMMUNITY): Payer: Self-pay

## 2024-01-20 DIAGNOSIS — F419 Anxiety disorder, unspecified: Secondary | ICD-10-CM | POA: Diagnosis not present

## 2024-01-20 DIAGNOSIS — F39 Unspecified mood [affective] disorder: Secondary | ICD-10-CM | POA: Diagnosis not present

## 2024-01-20 DIAGNOSIS — E669 Obesity, unspecified: Secondary | ICD-10-CM | POA: Diagnosis not present

## 2024-01-20 DIAGNOSIS — R7989 Other specified abnormal findings of blood chemistry: Secondary | ICD-10-CM

## 2024-01-20 DIAGNOSIS — E0590 Thyrotoxicosis, unspecified without thyrotoxic crisis or storm: Secondary | ICD-10-CM | POA: Diagnosis not present

## 2024-01-20 DIAGNOSIS — Z59819 Housing instability, housed unspecified: Secondary | ICD-10-CM | POA: Diagnosis not present

## 2024-01-20 DIAGNOSIS — F32A Depression, unspecified: Secondary | ICD-10-CM | POA: Diagnosis not present

## 2024-01-20 DIAGNOSIS — D509 Iron deficiency anemia, unspecified: Secondary | ICD-10-CM | POA: Diagnosis not present

## 2024-01-20 LAB — COMPREHENSIVE METABOLIC PANEL WITH GFR
ALT: 44 U/L (ref 0–44)
AST: 26 U/L (ref 15–41)
Albumin: 2.9 g/dL — ABNORMAL LOW (ref 3.5–5.0)
Alkaline Phosphatase: 85 U/L (ref 38–126)
Anion gap: 7 (ref 5–15)
BUN: 8 mg/dL (ref 6–20)
CO2: 22 mmol/L (ref 22–32)
Calcium: 9.1 mg/dL (ref 8.9–10.3)
Chloride: 110 mmol/L (ref 98–111)
Creatinine, Ser: 0.52 mg/dL (ref 0.44–1.00)
GFR, Estimated: >60 mL/min (ref >60)
Glucose, Bld: 116 mg/dL — ABNORMAL HIGH (ref 70–99)
Potassium: 3.8 mmol/L (ref 3.5–5.1)
Sodium: 139 mmol/L (ref 135–145)
Total Bilirubin: 0.6 mg/dL (ref 0.0–1.2)
Total Protein: 6.4 g/dL — ABNORMAL LOW (ref 6.5–8.1)

## 2024-01-20 LAB — CBC
HCT: 35.6 % — ABNORMAL LOW (ref 36.0–46.0)
Hemoglobin: 11.2 g/dL — ABNORMAL LOW (ref 12.0–15.0)
MCH: 23.5 pg — ABNORMAL LOW (ref 26.0–34.0)
MCHC: 31.5 g/dL (ref 30.0–36.0)
MCV: 74.6 fL — ABNORMAL LOW (ref 80.0–100.0)
Platelets: 171 10*3/uL (ref 150–400)
RBC: 4.77 MIL/uL (ref 3.87–5.11)
RDW: 15.4 % (ref 11.5–15.5)
WBC: 4.3 10*3/uL (ref 4.0–10.5)
nRBC: 0 % (ref 0.0–0.2)

## 2024-01-20 LAB — T3, FREE: T3, Free: 28.2 pg/mL — ABNORMAL HIGH (ref 2.0–4.4)

## 2024-01-20 MED ORDER — METHIMAZOLE 10 MG PO TABS
20.0000 mg | ORAL_TABLET | Freq: Two times a day (BID) | ORAL | 0 refills | Status: DC
  Filled 2024-01-20: qty 120, 30d supply, fill #0

## 2024-01-20 MED ORDER — HYDROXYZINE HCL 25 MG PO TABS
25.0000 mg | ORAL_TABLET | Freq: Four times a day (QID) | ORAL | 0 refills | Status: DC | PRN
  Filled 2024-01-20: qty 60, 15d supply, fill #0

## 2024-01-20 MED ORDER — ESCITALOPRAM OXALATE 10 MG PO TABS
10.0000 mg | ORAL_TABLET | Freq: Every day | ORAL | 0 refills | Status: DC
  Filled 2024-01-20: qty 30, 30d supply, fill #0

## 2024-01-20 MED ORDER — PROPRANOLOL HCL 20 MG PO TABS
20.0000 mg | ORAL_TABLET | Freq: Three times a day (TID) | ORAL | 0 refills | Status: DC
  Filled 2024-01-20: qty 90, 30d supply, fill #0

## 2024-01-20 MED ORDER — ARIPIPRAZOLE 5 MG PO TABS
5.0000 mg | ORAL_TABLET | Freq: Every day | ORAL | 0 refills | Status: DC
  Filled 2024-01-20: qty 30, 30d supply, fill #0

## 2024-01-20 MED ORDER — MELATONIN 3 MG PO TABS: 3.0000 mg | ORAL_TABLET | Freq: Every evening | ORAL | Status: DC | PRN

## 2024-01-20 MED ORDER — NICOTINE 14 MG/24HR TD PT24: 14.0000 mg | MEDICATED_PATCH | Freq: Every day | TRANSDERMAL | Status: DC

## 2024-01-20 NOTE — Progress Notes (Signed)
 2 bus passes given at discharge. Pt also instructed to pick up TOC meds on second for when she is leaving. Pt verbalized understanding.

## 2024-01-20 NOTE — Progress Notes (Signed)
 BLE venous duplex has been completed.   Results can be found under chart review under CV PROC. 01/20/2024 3:19 PM Shakenya Stoneberg RVT, RDMS

## 2024-01-20 NOTE — TOC Progression Note (Addendum)
 Transition of Care [facility]) - Progression Note    Patient Details  Name: Alison Cummings MRN: [id number] Date of Birth: October 19, 2000  Transition of Care [facility]) CM/SW Contact  Katrinka Parr, [place] Phone Number: 01/20/2024, 10:37 AM  Clinical Narrative:      TOC consult placed for PCP and medication assistance with comment Please reach out to patient sister in regard to need seems patient has been homelessness  CSW met with pt to discuss. She does not want CSW to reach out to sister, rather, she request provider to call sister and update on her medical status; CSW notified provider. Pt states she has been staying with a friend. She does need bus tickets to the [facility] at discharge as she needs to go there to speak with her case worker. Pt states she has a PCP and has insurance for her medications.   TOC to provide RN with bus passes.  Shelter resources added to AVS.       Expected Discharge Plan and Services         Expected Discharge Date: 01/20/24                                     Social Determinants of Health (SDOH) Interventions SDOH Screenings   Food Insecurity: No Food Insecurity (01/19/2024)  Recent Concern: Food Insecurity - Food Insecurity Present (01/10/2024)  Housing: Unknown (01/19/2024)  Recent Concern: Housing - High Risk (01/10/2024)  Transportation Needs: No Transportation Needs (01/19/2024)  Utilities: Not At Risk (01/19/2024)  Alcohol Screen: Low Risk  (01/10/2024)  Depression (PHQ2-9): Medium Risk (06/11/2023)  Financial Resource Strain: Low Risk  (10/22/2023)   Received from [facility]  Physical Activity: Not on File (11/22/2021)   Received from [facility]  Social Connections: Not on File (04/19/2023)   Received from [facility]  Stress: Not on File (11/22/2021)   Received from [facility]  Tobacco Use: Medium Risk (01/19/2024)    Readmission Risk Interventions     No data to display

## 2024-01-20 NOTE — Discharge Summary (Signed)
 Physician Discharge Summary  Alison Cummings:[id number] DOB: Mar 25, 2001 DOA: 01/18/2024  PCP: Paseda, Folashade R, FNP  Admit date: 01/18/2024 Discharge date: 01/20/24  Admitted From: Homeless but stays with friend.   Disposition: Home with friend. Recommendations for Outpatient Follow-up:  Recommended outpatient follow-up with PCP, endocrinology and cardiology as soon as possible Check CBC and CMP at follow-up Please follow up on the following pending results: None  Home Health: No need identified Equipment/Devices: No need identified  Discharge Condition: Stable CODE STATUS: Full code   Hospital course 23 year old F with PMH of primary hyperthyroidism, HTN, anxiety, depression, bipolar disorder, ADHD and class II obesity presenting with dizziness and chest pain, and admitted with thyrotoxicosis without thyroid  storm.  Patient was not compliant with her medication.  Reportedly takes methimazole  once a day instead of twice a day.  She states seeing cardiology and endocrinology outpatient.  In ED, slightly tachycardic to 110s.  Other vitals stable.  Orthostatic vitals negative.  CMP and CBC without significant finding other than mild microcytic anemia to 12.5.  Serum pregnancy test negative.  UA unrevealing.  TSH undetectable.  Free T4 > 5.5.  D-dimer was elevated to 2.12.  CT angio chest negative for PE.  Patient was started on home methimazole  and atenolol  and admitted.  The next day, patient's symptoms resolved.  Lower extremity venous Doppler negative for DVT.  She is discharged on home methimazole  20 mg twice daily.  Changed atenolol  to propranolol .  Prescriptions filled at [facility] pharmacy on discharge.  Counseled on the importance of compliance with her medication.  Outpatient follow-up with PCP, cardiology endocrinology as soon as possible.  Assessment and plan discussed with patient's sister over the phone at patient's request.  See individual problem list below for more.    Problems addressed during this hospitalization Principal Problem:   Thyrotoxicosis Active Problems:   Hyperthyroidism   Microcytic anemia   Episodic mood disorder (HCC)   Anxiety and depression   Obesity (BMI 30-39.9)   Housing insecurity              Time spent 35 minutes  Vital signs Vitals:   01/20/24 0452 01/20/24 0500 01/20/24 0854 01/20/24 0854  BP: 130/74  109/71 109/71  Pulse: (!) 107  (!) 112 (!) 113  Temp: 98.3 F (36.8 C)   98.1 F (36.7 C)  Resp: 16   19  Height:      Weight:  88.9 kg    SpO2: 98%   100%  TempSrc:      BMI (Calculated):  35.84       Discharge exam  GENERAL: No apparent distress.  Nontoxic. HEENT: MMM.  Vision and hearing grossly intact.  NECK: Supple.  No apparent JVD.  No thyromegaly. RESP:  No IWOB.  Fair aeration bilaterally. CVS: Tachycardic to 100s.  Regular rhythm.  Heart sounds normal.  ABD/GI/GU: BS+. Abd soft, NTND.  MSK/EXT:  Moves extremities. No apparent deformity. No edema.  SKIN: no apparent skin lesion or wound NEURO: Awake and alert. Oriented appropriately.  No apparent focal neuro deficit. PSYCH: Calm. Normal affect.   Discharge Instructions Discharge Instructions     Diet general   Complete by: As directed    Increase activity slowly   Complete by: As directed       Allergies as of 01/20/2024       Reactions   Apple Itching, Swelling, Other (See Comments)   Tongue swells and throat itches   Cherry Itching, Swelling, Other (See  Comments)   Tongue swells and throat itches   Mangifera Indica    Ibuprofen  Palpitations, Swelling   Of feet        Medication List     STOP taking these medications    atenolol  50 MG tablet Commonly known as: TENORMIN        TAKE these medications    ARIPiprazole  5 MG tablet Commonly known as: ABILIFY  Take 1 tablet (5 mg total) by mouth daily.   escitalopram  10 MG tablet Commonly known as: LEXAPRO  Take 1 tablet (10 mg total) by mouth daily.    hydrOXYzine  25 MG tablet Commonly known as: ATARAX  Take 1 tablet (25 mg total) by mouth every 6 (six) hours as needed for anxiety.   melatonin 3 MG Tabs tablet Take 1 tablet (3 mg total) by mouth at bedtime as needed (insomnia).   methimazole  10 MG tablet Commonly known as: TAPAZOLE  Take 2 tablets (20 mg total) by mouth 2 (two) times daily.   nicotine 14 mg/24hr patch Commonly known as: NICODERM CQ - dosed in mg/24 hours Place 1 patch (14 mg total) onto the skin daily. Start taking on: January 21, 2024   propranolol  20 MG tablet Commonly known as: INDERAL  Take 1 tablet (20 mg total) by mouth 3 (three) times daily.        Consultations: None  Procedures/Studies:   VAS US  LOWER EXTREMITY VENOUS (DVT) Result Date: 01/20/2024  Lower Venous DVT Study Patient Name:  Alison Cummings  Date of Exam:   01/20/2024 Medical Rec #: [id number]          Accession #:    [id number] Date of Birth: May 19, 2001         Patient Gender: F Patient Age:   22 years Exam Location:  [facility] Procedure:      VAS US  LOWER EXTREMITY VENOUS (DVT) Referring Phys: Lorella Roles Carlita Whitcomb --------------------------------------------------------------------------------  Indications: Elevated D-dimer (2.12).  Limitations: Poor ultrasound/tissue interface. Comparison Study: No previous exams Performing Technologist: Jody Hill RVT, RDMS  Examination Guidelines: A complete evaluation includes B-mode imaging, spectral Doppler, color Doppler, and power Doppler as needed of all accessible portions of each vessel. Bilateral testing is considered an integral part of a complete examination. Limited examinations for reoccurring indications may be performed as noted. The reflux portion of the exam is performed with the patient in reverse Trendelenburg.  +--------+---------------+---------+-----------+----------+--------------------+ RIGHT   CompressibilityPhasicitySpontaneityPropertiesThrombus Aging        +--------+---------------+---------+-----------+----------+--------------------+ CFV     Full           Yes      Yes                                       +--------+---------------+---------+-----------+----------+--------------------+ SFJ     Full                                                              +--------+---------------+---------+-----------+----------+--------------------+ FV Prox Full           Yes      Yes                                       +--------+---------------+---------+-----------+----------+--------------------+  FV Mid  Full           Yes      Yes                                       +--------+---------------+---------+-----------+----------+--------------------+ FV      Full           Yes      Yes                                       Distal                                                                    +--------+---------------+---------+-----------+----------+--------------------+ PFV                    Yes      Yes                  patent by                                                                 color/doppler        +--------+---------------+---------+-----------+----------+--------------------+ POP     Full           Yes      Yes                                       +--------+---------------+---------+-----------+----------+--------------------+ PTV     Full                                                              +--------+---------------+---------+-----------+----------+--------------------+ PERO    Full                                                              +--------+---------------+---------+-----------+----------+--------------------+   +--------+---------------+---------+-----------+----------+--------------------+ LEFT    CompressibilityPhasicitySpontaneityPropertiesThrombus Aging       +--------+---------------+---------+-----------+----------+--------------------+  CFV     Full           Yes      Yes                                       +--------+---------------+---------+-----------+----------+--------------------+ SFJ     Full                                                              +--------+---------------+---------+-----------+----------+--------------------+  FV Prox Full           Yes      Yes                                       +--------+---------------+---------+-----------+----------+--------------------+ FV Mid  Full           Yes      Yes                                       +--------+---------------+---------+-----------+----------+--------------------+ FV      Full           Yes      Yes                                       Distal                                                                    +--------+---------------+---------+-----------+----------+--------------------+ PFV                    Yes      Yes                  patent by                                                                 color/doppler        +--------+---------------+---------+-----------+----------+--------------------+ POP     Full           Yes      Yes                                       +--------+---------------+---------+-----------+----------+--------------------+ PTV     Full                                                              +--------+---------------+---------+-----------+----------+--------------------+ PERO    Full                                                              +--------+---------------+---------+-----------+----------+--------------------+    Summary: BILATERAL: - No evidence of deep vein thrombosis seen in the lower extremities, bilaterally. -No evidence of popliteal cyst, bilaterally. RIGHT: - Ultrasound characteristics of enlarged lymph nodes are noted  in the groin.  LEFT: - Ultrasound characteristics of enlarged lymph nodes noted in the groin.  *See table(s)  above for measurements and observations.    Preliminary    CT Angio Chest PE W and/or Wo Contrast Result Date: 01/19/2024 EXAM: CTA of the Chest with contrast for PE 01/19/2024 02:48:12 AM TECHNIQUE: CTA of the chest was performed after the administration of intravenous contrast. Multiplanar reformatted images are provided for review. MIP images are provided for review. Automated exposure control, iterative reconstruction, and/or weight based adjustment of the mA/kV was utilized to reduce the radiation dose to as low as reasonably achievable. COMPARISON: Chest radiograph earlier today. CLINICAL HISTORY: Pulmonary embolism (PE) suspected, high prob. Chief complaints; Dizziness. FINDINGS: PULMONARY ARTERIES: Pulmonary arteries are adequately opacified for evaluation. No evidence of pulmonary embolism. Main pulmonary artery is normal in caliber. MEDIASTINUM: The heart and pericardium demonstrate no acute abnormality. There is no acute abnormality of the thoracic aorta. LYMPH NODES: No mediastinal, hilar or axillary lymphadenopathy. LUNGS AND PLEURA: 4 mm subpleural left lower lobe nodule (image 83), benign. Given the patient's age, no follow-up is recommended. No focal consolidation or pulmonary edema. No pleural effusion or pneumothorax. UPPER ABDOMEN: Limited images of the upper abdomen are unremarkable. SOFT TISSUES AND BONES: No acute bone or soft tissue abnormality. IMPRESSION: 1. No evidence of pulmonary embolism. Electronically signed by: Zadie Herter MD 01/19/2024 02:51 AM EDT RP Workstation: [id number]   DG Chest 2 View Result Date: 01/19/2024 CLINICAL DATA:  Shortness of breath and dizziness. EXAM: CHEST - 2 VIEW COMPARISON:  Portable chest 01/08/2024 FINDINGS: The heart size and mediastinal contours are within normal limits. Both lungs are clear. The visualized skeletal structures are unremarkable. IMPRESSION: No active cardiopulmonary disease. Electronically Signed   By: Denman Fischer M.D.   On:  01/19/2024 02:02   ECHOCARDIOGRAM COMPLETE Result Date: 01/10/2024    ECHOCARDIOGRAM REPORT   Patient Name:   HAIDY KACKLEY Date of Exam: 01/09/2024 Medical Rec #:  [id number]         Height:       62.0 in Accession #:    [id number]        Weight:       198.4 lb Date of Birth:  06/24/2001        BSA:          1.905 m Patient Age:    22 years          BP:           114/75 mmHg Patient Gender: F                 HR:           93 bpm. Exam Location:  Inpatient Procedure: 2D Echo, Cardiac Doppler and Color Doppler (Both Spectral and Color            Flow Doppler were utilized during procedure). Indications:    Syncope R55  History:        Patient has no prior history of Echocardiogram examinations.                 Arrythmias:Tachycardia; Risk Factors:Hypertension.  Sonographer:    Terrilee Few RCS Referring Phys: [id number] JEFFREY T MCCLUNG IMPRESSIONS  1. Left ventricular ejection fraction, by estimation, is 60 to 65%. The left ventricle has normal function. The left ventricle has no regional wall motion abnormalities. Left ventricular diastolic parameters were normal.  2. Right ventricular systolic function is normal. The right ventricular size  is normal.  3. The mitral valve is normal in structure. No evidence of mitral valve regurgitation. No evidence of mitral stenosis.  4. The aortic valve is normal in structure. Aortic valve regurgitation is not visualized. No aortic stenosis is present.  5. The inferior vena cava is normal in size with greater than 50% respiratory variability, suggesting right atrial pressure of 3 mmHg. FINDINGS  Left Ventricle: Left ventricular ejection fraction, by estimation, is 60 to 65%. The left ventricle has normal function. The left ventricle has no regional wall motion abnormalities. Strain was performed and the global longitudinal strain is indeterminate. The left ventricular internal cavity size was normal in size. There is no left ventricular hypertrophy. Left ventricular diastolic  parameters were normal. Right Ventricle: The right ventricular size is normal. No increase in right ventricular wall thickness. Right ventricular systolic function is normal. Left Atrium: Left atrial size was normal in size. Right Atrium: Right atrial size was normal in size. Pericardium: There is no evidence of pericardial effusion. Mitral Valve: The mitral valve is normal in structure. No evidence of mitral valve regurgitation. No evidence of mitral valve stenosis. Tricuspid Valve: The tricuspid valve is normal in structure. Tricuspid valve regurgitation is not demonstrated. No evidence of tricuspid stenosis. Aortic Valve: The aortic valve is normal in structure. Aortic valve regurgitation is not visualized. No aortic stenosis is present. Aortic valve peak gradient measures 8.8 mmHg. Pulmonic Valve: The pulmonic valve was normal in structure. Pulmonic valve regurgitation is not visualized. No evidence of pulmonic stenosis. Aorta: The aortic root is normal in size and structure. Venous: The inferior vena cava is normal in size with greater than 50% respiratory variability, suggesting right atrial pressure of 3 mmHg. IAS/Shunts: No atrial level shunt detected by color flow Doppler. Additional Comments: 3D was performed not requiring image post processing on an independent workstation and was indeterminate.  LEFT VENTRICLE PLAX 2D LVIDd:         5.00 cm   Diastology LVIDs:         3.50 cm   LV e' medial:    15.30 cm/s LV PW:         0.90 cm   LV E/e' medial:  6.2 LV IVS:        0.70 cm   LV e' lateral:   20.00 cm/s LVOT diam:     2.20 cm   LV E/e' lateral: 4.8 LV SV:         79 LV SV Index:   42 LVOT Area:     3.80 cm  RIGHT VENTRICLE             IVC RV S prime:     16.50 cm/s  IVC diam: 1.60 cm TAPSE (M-mode): 3.0 cm LEFT ATRIUM             Index        RIGHT ATRIUM           Index LA diam:        3.30 cm 1.73 cm/m   RA Area:     15.00 cm LA Vol (A2C):   31.4 ml 16.48 ml/m  RA Volume:   39.30 ml  20.63 ml/m LA  Vol (A4C):   29.8 ml 15.64 ml/m LA Biplane Vol: 30.4 ml 15.96 ml/m  AORTIC VALVE AV Area (Vmax): 3.08 cm AV Vmax:        148.00 cm/s AV Peak Grad:   8.8 mmHg LVOT Vmax:  120.00 cm/s LVOT Vmean:     81.200 cm/s LVOT VTI:       0.208 m  AORTA Ao Root diam: 2.60 cm Ao Asc diam:  2.50 cm MITRAL VALVE MV Area (PHT): 4.86 cm    SHUNTS MV Decel Time: 156 msec    Systemic VTI:  0.21 m MV E velocity: 95.40 cm/s  Systemic Diam: 2.20 cm MV A velocity: 77.60 cm/s MV E/A ratio:  1.23 Janelle Mediate MD Electronically signed by Janelle Mediate MD Signature Date/Time: 01/10/2024/3:10:19 PM    Final    CT Head Wo Contrast Result Date: 01/08/2024 CLINICAL DATA:  Head trauma.  Moderate to severe EXAM: CT HEAD WITHOUT CONTRAST TECHNIQUE: Contiguous axial images were obtained from the base of the skull through the vertex without intravenous contrast. RADIATION DOSE REDUCTION: This exam was performed according to the departmental dose-optimization program which includes automated exposure control, adjustment of the mA and/or kV according to patient size and/or use of iterative reconstruction technique. COMPARISON:  None Available. FINDINGS: Brain: No acute intracranial hemorrhage. No focal mass lesion. No CT evidence of acute infarction. No midline shift or mass effect. No hydrocephalus. Basilar cisterns are patent. Vascular: No hyperdense vessel or unexpected calcification. Skull: Normal. Negative for fracture or focal lesion. Sinuses/Orbits: Paranasal sinuses and mastoid air cells are clear. Orbits are clear. Other: None. IMPRESSION: No acute intracranial findings. Electronically Signed   By: Deboraha Fallow M.D.   On: 01/08/2024 11:37   DG Chest Portable 1 View Result Date: 01/08/2024 CLINICAL DATA:  Portable chest, chest pain EXAM: PORTABLE CHEST 1 VIEW COMPARISON:  April 22 25 FINDINGS: The heart size and mediastinal contours are within normal limits. Both lungs are clear. The visualized skeletal structures are unremarkable.  IMPRESSION: No active disease. Electronically Signed   By: Fredrich Jefferson M.D.   On: 01/08/2024 09:56       The results of significant diagnostics from this hospitalization (including imaging, microbiology, ancillary and laboratory) are listed below for reference.     Microbiology: No results found for this or any previous visit (from the past 240 hours).   Labs:  CBC: Recent Labs  Lab 01/19/24 0025 01/20/24 1141  WBC 5.5 4.3  HGB 10.5* 11.2*  HCT 34.2* 35.6*  MCV 74.3* 74.6*  PLT 182 171   BMP &GFR Recent Labs  Lab 01/19/24 0025 01/20/24 1141  NA 140 139  K 3.6 3.8  CL 109 110  CO2 20* 22  GLUCOSE 83 116*  BUN 14 8  CREATININE 0.57 0.52  CALCIUM 9.2 9.1  MG 1.8  --    Estimated Creatinine Clearance: 115.2 mL/min (by C-G formula based on SCr of 0.52 mg/dL). Liver & Pancreas: Recent Labs  Lab 01/19/24 0025 01/20/24 1141  AST 32 26  ALT 53* 44  ALKPHOS 101 85  BILITOT 0.6 0.6  PROT 6.9 6.4*  ALBUMIN 3.3* 2.9*   No results for input(s): LIPASE, AMYLASE in the last 168 hours. No results for input(s): AMMONIA in the last 168 hours. Diabetic: No results for input(s): HGBA1C in the last 72 hours. No results for input(s): GLUCAP in the last 168 hours. Cardiac Enzymes: No results for input(s): CKTOTAL, CKMB, CKMBINDEX, TROPONINI in the last 168 hours. No results for input(s): PROBNP in the last 8760 hours. Coagulation Profile: No results for input(s): INR, PROTIME in the last 168 hours. Thyroid  Function Tests: Recent Labs    01/19/24 0025 01/19/24 0116 01/19/24 0145  TSH <0.010*  --   --  FREET4  --  >5.50*  --   T3FREE  --   --  28.2*   Lipid Profile: No results for input(s): CHOL, HDL, LDLCALC, TRIG, CHOLHDL, LDLDIRECT in the last 72 hours. Anemia Panel: Recent Labs    01/19/24 0025 01/19/24 2214  VITAMINB12  --  478  FOLATE  --  19.2  FERRITIN  --  66  TIBC  --  263  IRON  --  50  RETICCTPCT 0.9  --     Urine analysis:    Component Value Date/Time   COLORURINE YELLOW 01/18/2024 2330   APPEARANCEUR CLEAR 01/18/2024 2330   LABSPEC 1.027 01/18/2024 2330   PHURINE 5.0 01/18/2024 2330   GLUCOSEU NEGATIVE 01/18/2024 2330   HGBUR NEGATIVE 01/18/2024 2330   BILIRUBINUR NEGATIVE 01/18/2024 2330   KETONESUR NEGATIVE 01/18/2024 2330   PROTEINUR NEGATIVE 01/18/2024 2330   UROBILINOGEN 0.2 05/19/2021 1417   NITRITE NEGATIVE 01/18/2024 2330   LEUKOCYTESUR NEGATIVE 01/18/2024 2330   Sepsis Labs: Invalid input(s): PROCALCITONIN, LACTICIDVEN   SIGNED:  Jaylee Lantry T Teriyah Purington, MD  [facility] 01/20/2024, 3:58 PM

## 2024-01-20 NOTE — Progress Notes (Signed)
   01/20/24 0854  Assess: MEWS Score  Temp 98.1 F (36.7 C)  BP 109/71  MAP (mmHg) 82  Pulse Rate (!) 113  Resp 19  Level of Consciousness Alert  SpO2 100 %  O2 Device Room Air  Assess: MEWS Score  MEWS Temp 0  MEWS Systolic 0  MEWS Pulse 2  MEWS RR 0  MEWS LOC 0  MEWS Score 2  MEWS Score Color Yellow  Assess: if the MEWS score is Yellow or Red  Were vital signs accurate and taken at a resting state? Yes  Does the patient meet 2 or more of the SIRS criteria? No  MEWS guidelines implemented  Yes, yellow  Treat  MEWS Interventions Considered administering scheduled or prn medications/treatments as ordered  Take Vital Signs  Increase Vital Sign Frequency  Yellow: Q2hr x1, continue Q4hrs until patient remains green for 12hrs  Escalate  MEWS: Escalate Yellow: Discuss with charge nurse and consider notifying provider and/or RRT  Notify: Charge Nurse/RN  Name of Charge Nurse/RN Notified Ben,RN  Provider Notification  Provider Name/Title Teresita Fent  Date Provider Notified 01/20/24  Time Provider Notified [phone]  Method of Notification Page  Notification Reason Other (Comment) (Yellow MEWS [phone])  Provider response No new orders  Date of Provider Response 01/20/24  Time of Provider Response 0900  Assess: SIRS CRITERIA  SIRS Temperature  0  SIRS Respirations  0  SIRS Pulse 1  SIRS WBC 0  SIRS Score Sum  1

## 2024-01-20 NOTE — Plan of Care (Signed)

## 2024-01-20 NOTE — Progress Notes (Signed)
Discharge instructions given to pt. Pt verbalized understanding of all teaching and had no further questions. 

## 2024-01-20 NOTE — Progress Notes (Signed)
 Cardiac monitoring orders expired and need to be renewed. Gonfa,MD made aware

## 2024-02-05 NOTE — Congregational Nurse Program (Signed)
 RN assisted client with getting access to PCP appointment, help with reaching out to [facility] for behavioral health support and medication management. Client also shown opportunities to advance job skills and resources. Client is appreciative of RN support, and offerings of active and empathetic listening. RN will revisit client on 02/12/24 to set up cardiologist and endocrinology appointments.

## 2024-02-10 ENCOUNTER — Other Ambulatory Visit: Payer: Self-pay

## 2024-02-10 ENCOUNTER — Emergency Department (HOSPITAL_COMMUNITY)

## 2024-02-10 ENCOUNTER — Encounter (HOSPITAL_COMMUNITY): Payer: Self-pay

## 2024-02-10 ENCOUNTER — Emergency Department (HOSPITAL_COMMUNITY)
Admission: EM | Admit: 2024-02-10 | Discharge: 2024-02-11 | Disposition: A | Discharge status: Home / Self Care | Attending: Emergency Medicine | Admitting: Emergency Medicine

## 2024-02-10 DIAGNOSIS — I10 Essential (primary) hypertension: Secondary | ICD-10-CM | POA: Diagnosis not present

## 2024-02-10 DIAGNOSIS — Z87891 Personal history of nicotine dependence: Secondary | ICD-10-CM | POA: Insufficient documentation

## 2024-02-10 DIAGNOSIS — M542 Cervicalgia: Secondary | ICD-10-CM | POA: Diagnosis not present

## 2024-02-10 DIAGNOSIS — R569 Unspecified convulsions: Secondary | ICD-10-CM | POA: Diagnosis not present

## 2024-02-10 DIAGNOSIS — R55 Syncope and collapse: Secondary | ICD-10-CM | POA: Insufficient documentation

## 2024-02-10 DIAGNOSIS — R064 Hyperventilation: Secondary | ICD-10-CM | POA: Diagnosis not present

## 2024-02-10 LAB — CBC
HCT: 38.6 % (ref 36.0–46.0)
Hemoglobin: 12 g/dL (ref 12.0–15.0)
MCH: 23.3 pg — ABNORMAL LOW (ref 26.0–34.0)
MCHC: 31.1 g/dL (ref 30.0–36.0)
MCV: 75 fL — ABNORMAL LOW (ref 80.0–100.0)
Platelets: 161 K/uL (ref 150–400)
RBC: 5.15 MIL/uL — ABNORMAL HIGH (ref 3.87–5.11)
RDW: 15.6 % — ABNORMAL HIGH (ref 11.5–15.5)
WBC: 5.8 K/uL (ref 4.0–10.5)
nRBC: 0 % (ref 0.0–0.2)

## 2024-02-10 LAB — BASIC METABOLIC PANEL WITH GFR
Anion gap: 11 (ref 5–15)
BUN: 12 mg/dL (ref 6–20)
CO2: 22 mmol/L (ref 22–32)
Calcium: 9.1 mg/dL (ref 8.9–10.3)
Chloride: 104 mmol/L (ref 98–111)
Creatinine, Ser: 0.54 mg/dL (ref 0.44–1.00)
GFR, Estimated: >60 mL/min (ref >60)
Glucose, Bld: 103 mg/dL — ABNORMAL HIGH (ref 70–99)
Potassium: 3.8 mmol/L (ref 3.5–5.1)
Sodium: 137 mmol/L (ref 135–145)

## 2024-02-10 LAB — TSH: TSH: <0.01 u[IU]/mL — ABNORMAL LOW (ref 0.350–4.500)

## 2024-02-10 LAB — HCG, SERUM, QUALITATIVE: Preg, Serum: NEGATIVE

## 2024-02-10 LAB — RAPID URINE DRUG SCREEN, HOSP PERFORMED
Amphetamines: NOT DETECTED
Barbiturates: NOT DETECTED
Benzodiazepines: NOT DETECTED
Cocaine: NOT DETECTED
Opiates: NOT DETECTED
Tetrahydrocannabinol: NOT DETECTED

## 2024-02-10 LAB — ETHANOL: Alcohol, Ethyl (B): <15 mg/dL (ref <15)

## 2024-02-10 NOTE — ED Triage Notes (Addendum)
 [facility] for a possible seizure. She was found on the floor and was given 4mg  narcan for possible drug use yesterday. Pt c/o neck and back pain and has been A&Ox4 with EMS. Pt also stated before she fell, she remembered hitting her head on the toilet. Pt was placed on 2L O2 as well.  Hx seizures 127 cbg
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
## 2024-02-11 LAB — T4, FREE: Free T4: 3.11 ng/dL — ABNORMAL HIGH (ref 0.61–1.12)

## 2024-02-11 NOTE — Discharge Instructions (Addendum)
 While you were in the emergency room, you had blood work done that was normal.  Based on your symptoms, I think this is likely a call syncope, which is a fainting reaction.  Make sure that you are getting plenty of water to drink, continue taking all of your medications as prescribed including your methimazole  for your thyroid .  Follow-up with your primary care doctor when you are able.  If you will be in jail for greater than 1 month, you should have routine blood work drawn in the next 1 week to check your thyroid  function.  I have also included a follow-up with neurology for outpatient testing which can be done to assess for seizures.  Return to emergency room if you have repeated episode.

## 2024-03-16 ENCOUNTER — Inpatient Hospital Stay: Payer: Self-pay | Admitting: Nurse Practitioner

## 2024-05-07 ENCOUNTER — Emergency Department (HOSPITAL_COMMUNITY)
Admission: EM | Admit: 2024-05-07 | Discharge: 2024-05-08 | Attending: Emergency Medicine | Admitting: Emergency Medicine

## 2024-05-07 ENCOUNTER — Encounter (HOSPITAL_COMMUNITY): Payer: Self-pay

## 2024-05-07 ENCOUNTER — Other Ambulatory Visit: Payer: Self-pay

## 2024-05-07 DIAGNOSIS — T7840XA Allergy, unspecified, initial encounter: Secondary | ICD-10-CM | POA: Insufficient documentation

## 2024-05-07 DIAGNOSIS — Z91010 Allergy to peanuts: Secondary | ICD-10-CM | POA: Insufficient documentation

## 2024-05-07 MED ORDER — DIPHENHYDRAMINE HCL 50 MG/ML IJ SOLN
25.0000 mg | Freq: Once | INTRAMUSCULAR | Status: AC
  Administered 2024-05-07: 25 mg via INTRAVENOUS
  Filled 2024-05-07: qty 1

## 2024-05-07 MED ORDER — METHYLPREDNISOLONE SODIUM SUCC 125 MG IJ SOLR
125.0000 mg | INTRAMUSCULAR | Status: AC
  Administered 2024-05-07: 125 mg via INTRAVENOUS
  Filled 2024-05-07: qty 2

## 2024-05-07 MED ORDER — FAMOTIDINE IN NACL 20-0.9 MG/50ML-% IV SOLN
20.0000 mg | Freq: Once | INTRAVENOUS | Status: AC
  Administered 2024-05-07: 20 mg via INTRAVENOUS
  Filled 2024-05-07: qty 50

## 2024-05-07 NOTE — ED Triage Notes (Signed)
 Pt came in via EMS from jail w/ c/o of allergic reaction to peanuts. Ingested cookie mix that contained allergen. Pt has lip and tongue swelling. Airway intact.   0.3 epi in right thigh at jail

## 2024-05-08 MED ORDER — FAMOTIDINE 20 MG PO TABS: 20.0000 mg | ORAL_TABLET | Freq: Two times a day (BID) | ORAL | 0 refills | Status: DC

## 2024-05-08 MED ORDER — PREDNISONE 20 MG PO TABS: ORAL_TABLET | ORAL | 0 refills | Status: DC

## 2024-05-08 NOTE — ED Provider Notes (Signed)
 [place] EMERGENCY DEPARTMENT AT [facility] Provider Note   CSN: [id number] Arrival date & time: 05/07/24  2251     Patient presents with: Allergic Reaction   Alison Cummings is a 23 y.o. female.   The history is provided by the patient.  Allergic Reaction Presenting symptoms: no difficulty breathing, no rash and no wheezing   Severity:  Moderate Prior allergic episodes:  Food/nut allergies Context comment:  A peanut Relieved by:  Nothing Worsened by:  Nothing Ineffective treatments:  None tried Reportedly got epi at the jail for peanut exposure.       Prior to Admission medications   Medication Sig Start Date End Date Taking? Authorizing Provider  ARIPiprazole  (ABILIFY ) 5 MG tablet Take 1 tablet (5 mg total) by mouth daily. 01/20/24   Gonfa, Taye T, MD  escitalopram  (LEXAPRO ) 10 MG tablet Take 1 tablet (10 mg total) by mouth daily. 01/20/24   Gonfa, Taye T, MD  hydrOXYzine  (ATARAX ) 25 MG tablet Take 1 tablet (25 mg total) by mouth every 6 (six) hours as needed for anxiety. 01/20/24   Gonfa, Taye T, MD  melatonin 3 MG TABS tablet Take 1 tablet (3 mg total) by mouth at bedtime as needed (insomnia). 01/20/24   Gonfa, Taye T, MD  methimazole  (TAPAZOLE ) 10 MG tablet Take 2 tablets (20 mg total) by mouth 2 (two) times daily. 01/20/24   Gonfa, Taye T, MD  nicotine  (NICODERM CQ  - DOSED IN MG/24 HOURS) 14 mg/24hr patch Place 1 patch (14 mg total) onto the skin daily. 01/21/24   Gonfa, Taye T, MD  propranolol  (INDERAL ) 20 MG tablet Take 1 tablet (20 mg total) by mouth 3 (three) times daily. 01/20/24   Gonfa, Taye T, MD    Allergies: Apple, Cherry, Mangifera indica, Peanut (diagnostic), and Ibuprofen     Review of Systems  Constitutional:  Negative for fever.  HENT:  Negative for drooling and facial swelling.   Respiratory:  Negative for shortness of breath, wheezing and stridor.   Skin:  Negative for rash.  All other systems reviewed and are negative.   Updated Vital  Signs BP 113/78   Pulse 64   Temp 98.4 F (36.9 C) (Oral)   Resp 17   SpO2 100%   Physical Exam Vitals and nursing note reviewed.  Constitutional:      General: She is not in acute distress.    Appearance: She is well-developed.  HENT:     Head: Normocephalic and atraumatic.     Mouth/Throat:     Mouth: Mucous membranes are moist.     Pharynx: Oropharynx is clear.     Comments: No swelling of the lips tongue or uvula  Eyes:     Pupils: Pupils are equal, round, and reactive to light.  Cardiovascular:     Rate and Rhythm: Normal rate and regular rhythm.     Pulses: Normal pulses.     Heart sounds: Normal heart sounds.  Pulmonary:     Effort: Pulmonary effort is normal. No respiratory distress.     Breath sounds: Normal breath sounds.  Abdominal:     General: Bowel sounds are normal. There is no distension.     Palpations: Abdomen is soft.     Tenderness: There is no abdominal tenderness. There is no guarding or rebound.  Musculoskeletal:        General: Normal range of motion.     Cervical back: Neck supple.  Skin:    General: Skin is warm  and dry.     Capillary Refill: Capillary refill takes less than 2 seconds.     Findings: No erythema or rash.     Comments: No injection site on the right thigh   Neurological:     General: No focal deficit present.     Deep Tendon Reflexes: Reflexes normal.  Psychiatric:        Mood and Affect: Mood normal.     (all labs ordered are listed, but only abnormal results are displayed) Labs Reviewed - No data to display  EKG: None  Radiology: No results found.   Procedures   Medications Ordered in the ED  methylPREDNISolone sodium succinate (SOLU-MEDROL) 125 mg/2 mL injection 125 mg (125 mg Intravenous Given 05/07/24 2345)  famotidine (PEPCID) IVPB 20 mg premix (0 mg Intravenous Stopped 05/08/24 0128)  diphenhydrAMINE (BENADRYL) injection 25 mg (25 mg Intravenous Given 05/07/24 2344)                                     Medical Decision Making Reportedly got epi at jail for peanut exposure   Amount and/or Complexity of Data Reviewed Independent Historian: EMS    Details: Ems see above  External Data Reviewed: notes.    Details: Previous notes reviewed   Risk Prescription drug management. Risk Details: Patient was treated and observed in the ED.  there was no swelling, no hives, no rash.  No injection mark.  Stable for discharge.  Will start prednisone and pepcid.  Also take zyrtec  daily.       Final diagnoses:  None   No signs of systemic illness or infection. The patient is nontoxic-appearing on exam and vital signs are within normal limits.  I have reviewed the triage vital signs and the nursing notes. Pertinent labs & imaging results that were available during my care of the patient were reviewed by me and considered in my medical decision making (see chart for details). After history, exam, and medical workup I feel the patient has been appropriately medically screened and is safe for discharge home. Pertinent diagnoses were discussed with the patient. Patient was given return precautions.    ED Discharge Orders     None          Carole Deere, MD 05/08/24 9841
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
## 2024-07-07 NOTE — Discharge Summary (Signed)
 ------------------------------------------------------------------------------- Attestation signed by Marget Edsel Berg, MD at 07/08/24 [phone] I saw and evaluated the patient, participating in the key portions of the service on the day of discharge. I reviewed the discharge note and agree with discharge plans and disposition. I personally spent greater than 30 minutes in discharge planning services.   Edsel Berg Marget, MD   -------------------------------------------------------------------------------  [facility] Psychiatry  Discharge Summary  Admit date and time: 07/02/2024  7:31 PM Discharge date and time: 07/07/2024 at 11am Discharge to: Shelter Discharge Service: Psychiatry (PSY) Discharge Attending Physician: No att. providers found Discharge Unit 24-7 Contact Number: [phone] [facility] - Cornelius  Allergies: Connell, Eggs-apples-oats [nutritional supplement-fiber], and Peanut butter flavor  Chief Concern/Admitting Diagnosis:  HALLUCINATIONS  Command Auditory Hallucinations with suicidal ideation  Discharge Diagnosis:  Principal Problem:   Complex posttraumatic stress disorder (POA: YES) Active Problems:   Graves disease (POA: Yes) Resolved Problems:   Suicidal Ideation   Command Auditory Hallucinations   Stressors: medication non-compliance, unhoused, unemployed  Disability Assessment Scale: clinically assessed at moderate to severe   Discharge Day Services:  Hours of sleep overnight :  7.72  The treatment team, including resident and attending physicians, nursing staff, and social work/case management , met to discuss the patient's progress and plan of care.  Per 07/06/24 RN Epic note:  Evelyn Aguinaldo was seen pacing working on a puzzle in the dayroom, socializing with other patients and staff, calm, cooperative, coherent, follows direction, medication and treatment compliant. She denies SI/HI/AVH/pain/insomnia, or any physical discomfort. Patient  requested Hydroxyzine  PRN for anxiety with positive effects. No unsafe behavior noted or reported. Will continue to support, guide and provide patient-centered care.   MD interview:  She heard the voices a little bit earlier yesterday but they're gone now. She attributes this to the medication and her ability to be more active and how she is keeping her mind busier. She feels that she will be able to continue this process outside the hospital by using her coping skills. She is interested in attending the [facility]. She also found a site in [place] but couldn't affirm this location since she was unsure of when she was being discharged. She says that she plans to finish her book when she leaves the hospital. She woke up early this morning to read this book. She is denying SI/HI. Her mood is good.     ROS: Denies physical complaint  Vitals: Patient Vitals for the past 12 hrs:  BP Temp Temp src Resp SpO2  07/07/24 0749 115/77 36.1 C (97 F) Temporal 20 100 %    Height:  157.5 cm (5' 2) Weight: 100 kg (220 lb 9 oz) BMI: Body mass index is 40.34 kg/m.   Mental Status Exam: Appearance:    Appears stated age, Well nourished, Well developed, and Clean/Neat  Motor:   No abnormal movements  Speech/Language:    Normal rate, volume, tone, fluency and Language intact, well formed  Mood:   Good, happy  Affect:   Euthymic, Full, and bright  Thought process:   Linear and concrete  Thought content:     Denies SI, HI, self harm, delusions, obsessions, paranoid ideation, or ideas of reference  Perceptual disturbances:     Denies auditory and visual hallucinations, behavior not concerning for response to internal stimuli   Orientation:   Oriented to person, place, time, and general circumstances  Attention:   Able to fully attend without fluctuations in consciousness  Concentration:   Able to  fully concentrate and attend  Memory:   Immediate, short-term, long-term, and recall grossly  intact   Fund of knowledge:    Consistent with level of education and development  Insight:     Fair  Judgment:    Fair  Impulse Control:   Fair    Test Results: Data Review:  Results for orders placed or performed during the hospital encounter of 07/02/24  Urine Culture   Specimen: Clean Catch; Urine  Result Value Ref Range   Urine Culture, Comprehensive      Mixed Urogenital Flora; Possible Group B Strep Isolated. If definitive ID is needed for prenatal care, health care provider please contact Micro lab at [phone] within FIVE DAYS of order date.  Comprehensive Metabolic Panel  Result Value Ref Range   Sodium 145 135 - 145 mmol/L   Potassium 4.2 3.4 - 4.8 mmol/L   Chloride 107 98 - 107 mmol/L   CO2 25.0 20.0 - 31.0 mmol/L   Anion Gap 13 5 - 14 mmol/L   BUN 10 9 - 23 mg/dL   Creatinine 9.11 9.44 - 1.02 mg/dL   BUN/Creatinine Ratio 11    eGFR CKD-EPI (2021) Female >90 >=60 mL/min/1.34m2   Glucose 99 70 - 179 mg/dL   Calcium 9.3 8.7 - 89.5 mg/dL   Albumin 4.0 3.4 - 5.0 g/dL   Total Protein 8.0 5.7 - 8.2 g/dL   Total Bilirubin 0.6 0.3 - 1.2 mg/dL   AST 26 <=65 U/L   ALT 44 10 - 49 U/L   Alkaline Phosphatase 102 46 - 116 U/L  TSH  Result Value Ref Range   TSH 1.079 0.550 - 4.780 uIU/mL  Toxicology Screen, Urine  Result Value Ref Range   Amphetamines Screen, Ur Negative <500 ng/mL   Barbiturates Screen, Ur Negative <200 ng/mL   Benzodiazepines Screen, Urine Negative <200 ng/mL   Cannabinoids Screen, Ur Negative <20 ng/mL   Methadone Screen, Urine Negative <300 ng/mL   Cocaine(Metab.)Screen, Urine Negative <150 ng/mL   Opiates Screen, Ur Negative <300 ng/mL   Fentanyl Screen, Ur Negative <1.0 ng/mL   Oxycodone Screen, Ur Negative <100 ng/mL   Buprenorphine, Urine Negative <5 ng/mL  Pregnancy Qualitative, Urine  Result Value Ref Range   Pregnancy Test, Urine Negative Negative  Acetaminophen  level  Result Value Ref Range   Acetaminophen  Level <2.0 <=20.0 ug/ml   Salicylate level  Result Value Ref Range   Salicylate Lvl <3.0 <=30.0 mg/dL  T4, free  Result Value Ref Range   Free T4 0.75 (L) 0.89 - 1.76 ng/dL  Lipid Panel  Result Value Ref Range   Cholesterol, Total 163 <200 mg/dL   Cholesterol, HDL 55 >49 mg/dL   Cholesterol, LDL, Calculated 96 <100 mg/dL   Cholesterol, Non-HDL, Calculated 108 <130 mg/dL   Triglycerides 75 <849 mg/dL   Fasting    Hemoglobin A1c  Result Value Ref Range   Hemoglobin A1C 5.1 4.8 - 5.6 %   Estimated Average Glucose 100 mg/dL  CBC w/ Differential  Result Value Ref Range   WBC 5.2 3.6 - 11.2 10*9/L   RBC 4.84 3.95 - 5.13 10*12/L   HGB 12.8 11.3 - 14.9 g/dL   HCT 60.0 65.9 - 55.9 %   MCV 82.3 77.6 - 95.7 fL   MCH 26.4 25.9 - 32.4 pg   MCHC 32.1 32.0 - 36.0 g/dL   RDW 85.0 87.7 - 84.7 %   MPV 10.9 (H) 6.8 - 10.7 fL   Platelet 164 150 - 450  10*9/L   Neutrophils % 38.6 %   Lymphocytes % 50.9 %   Monocytes % 7.7 %   Eosinophils % 1.9 %   Basophils % 0.9 %   Absolute Neutrophils 2.0 1.8 - 7.8 10*9/L   Absolute Lymphocytes 2.6 1.1 - 3.6 10*9/L   Absolute Monocytes 0.4 0.3 - 0.8 10*9/L   Absolute Eosinophils 0.1 0.0 - 0.5 10*9/L   Absolute Basophils 0.0 0.0 - 0.1 10*9/L   Hypochromasia Slight (A) Not Present  Urinalysis with Microscopy with Culture Reflex  Result Value Ref Range   Color, UA Light Yellow    Clarity, UA Turbid    Specific Gravity, UA 1.023 1.003 - 1.030   pH, UA 6.5 5.0 - 9.0   Leukocyte Esterase, UA Large (A) Negative   Nitrite, UA Negative Negative   Protein, UA Trace (A) Negative   Glucose, UA Negative Negative   Ketones, UA Negative Negative   Urobilinogen, UA <2.0 mg/dL <7.9 mg/dL   Bilirubin, UA Negative Negative   Blood, UA Negative Negative   RBC, UA 3 <=4 /HPF   WBC, UA 12 (H) 0 - 5 /HPF   Squam Epithel, UA 26 (H) 0 - 5 /HPF   Bacteria, UA Occasional (A) None Seen /HPF   Mucus, UA Occasional (A) None Seen /HPF   Imaging: Radiology report(s) reviewed. Pending Test  Results: No pending test or studies  Psychometrics: Not applicable  Hospital Course:    STEPHANNE GREELEY is a 23 y.o. female with a past medical and psychiatric history of reported schizoaffective disorder, mdd w/ psychosis, cPTSD, Graves disease w/ hx of thyrotoxicosis, who initially presented to ED via self in the context of medication non-adherence, unhoused/unemployed, admitted involuntarily to the [facility] Crisis Stabilization unit for safety, stabilization, and management of CAH and SI.  Hospitalization Narrative:  The patient presented with symptoms of command auditory hallucinations to harm herself and other negative statements along with depressed mood. She has a significant history of childhood trauma, with a psychiatric admission at [facility] following the trauma in 2016. Reported family history of schizophrenia (mother and father). She reports chronic VH of dark shadows and tactile sensations of bugs crawling on her, which intensify when she is alone, stressed, or afraid. There is also likely IDD though have not found record of documented IQ testing. This, combined with her marked improvement with low dose risperidone  0.5mg  at bedtime raises suspicion for a trauma and stress related disorder such as cPTSD. Schizoaffective disorder, depressive type still on differential along with MDD w/ psychotic features.   Medically, patient has a history of hyperthyroidism with 2 hospitalizations for thyrotoxicosis in April 2025 [facility]) and June 2025 [facility]).  On admission TSH WNL, free T4 slightly below normal. At presentation patient reports inconsistently taking methimazole  and on blood work has low T4, therefore thyroid  abnormality is likely contributing to her symptomatology to some degree.   Treatment wise, consulted endocrinology for assistance with thyroid  disorder. Recommended methimazole  10mg  every day with follow up of TSH/T4 in 6 weeks. Additionally, initiated risperidone  and ultimitly titrated to  0.5mg  at bedtime given patient's report of previously good efficacy and tolerability. She has been on abilify  most recently and on chart review in the past and has consistently discontinued this medicine d/t concerns of poor efficacy. Risperidone  could allow for transition to LAI (consta v invega) though patient has discontinued LAI invega in the past d/t concerns of weight gain. With this medicine, she reports resolution of AH, CAH, SI, and depressive  symptoms.   Through hospitalization, there was improvement in and resolution of AH, CAH, SI, and depressive symptoms as well as improvement in insight, judgement, and maintained safety and impulse control.  12/2 Spoke with patient's sister who agree with no acute safety concerns and is in agreement with housing and follow up plan.  Psychiatric Follow Up Items:   # complex PTSD  c/f IDD # Documented h/o schizoaffective disorder -Risperidone  0.5mg  nightly given past efficacy -Atarax  25mg  BID PRN anxiety  Med trials: Risperidone , Latuda, olanzapine , abilify , vraylar  (made my eyes stuck). Also reports trial of Invega Sustenna that was discontinued due to weight gain. Recently prescribed Abilify , which was self-discontinued as pt did not find it as effective as past medications. - Lexapro  10 mg recently prescribed per pt, did not find effective.  Psychiatric aftercare plan:  [employer] [facility]    Medical: Admission workup included CBC, CMP, UA, UDS, EKG, TSH, Upreg, Salicylate lvl, APAP lvl, and T4, was remarkable for Low T4 (0.75)  Medical conditions managed during this admission:   # Hyperthyroidism Hospitalizations at [facility] 11/2023 and [facility]/2024 for thyrotoxicosis -- Continue home methimazole  10 mg every day  -- Propranolol  10mg  BID -- [X]  ambulatory endocrine referral placed  Medical Follow up items:  - [ ]  repeat TSH and T4 around January 6th - [ ]  endocrine follow up at [facility]  Metabolic Monitoring: Initial Weight: Weight: 100 kg (220 lb 9 oz) Last Weight: Weight: 100 kg (220 lb 9 oz) Last BMI: Body mass index is 40.34 kg/m. Admit BP: BP: 121/79 Last BP: BP: 113/77 Lipid Panel:  Lab Results  Component Value Date   Cholesterol, Total 163 07/02/2024   Cholesterol, LDL, Calculated 96 07/02/2024   Cholesterol, HDL 55 07/02/2024   Triglycerides 75 07/02/2024   Hemoglobin A1C:  Lab Results  Component Value Date   Hemoglobin A1C 5.1 07/02/2024    Fasting Blood Sugar: No results found for: [facility] Services Provided: Psychiatric physician and nursing services, case management, recreational therapy, occupational therapy, and edocrinology consultation. Multidisciplinary treatment plan(s) were established within 72 hours of admission; discussed daily and updated weekly. The patient had access to individual, group, and milieu therapeutic modalities.   Risk Assessment: On the day of discharge, the patient was evaluated by the attending physician and was discussed with the multidisciplinary treatment team. The treatment team has determined the patient to be stable and appropriate for discharge with medical approval.   Day of discharge assessment included a suicide and violence risk assessment. Risk factors for self-harm/suicide that are present at time of discharge include: chronic severe medical condition, chronic mental illness > 5 years, past diagnosis of depression, past diagnosis of schizophrenia, chronic impulsivity, chronic poor judgment, rigid thinking, and past trauma/abuse, social isolation.   A violence risk assessment was performed on the day of discharge. Risk factors for aggression/violence that are present at time of discharge include: low intellectual functioning and limited work history. These risk factors are mitigated by the following factors: lack of active SI/HI, no know access to weapons or firearms, motivation for treatment,  utilization of positive coping skills, enjoyment of leisure actvities, expresses purpose for living, religious or spiritual prohibition to suicide/violence, current treatment compliance, safe housing, support system in agreement with treatment recommendations, and presence of a safety plan with follow-up care. Furthermore, the treatment team has attempted to mitigate risk through supportive psychotherapy, providing psycho-education, thoughtful medication management, and communication with outpatient providers for  continuity of care. The patient was educated about relevant modifiable risk factors including following recommendations for treatment of psychiatric illness and abstaining from substance abuse.    While future psychiatric events cannot be accurately predicted, the patient does not currently require further acute inpatient psychiatric care and does not currently meet [place]  involuntary commitment criteria. It is recommended that the patient continue treatment in outpatient care. A follow up plan and crisis plan are in place, have been discussed with the patient, and the patient agrees to the plan at time of discharge.   The treatment team provided psycho-education and made recommendations regarding medication adherence, adaptive coping strategies, and healthy lifestyle modifications .    Recommendation for discharge safety planning included: removal of all firearms from the home, locking all sharps in a secure container, locking all weapons and dangerous objects in a secure container, locking all medications in a secure container, and increasing level of supervision.    Condition at Discharge: fair Discharge Medications:    Your Medication List     STOP taking these medications    aripiprazole  10 MG tablet Commonly known as: ABILIFY    escitalopram  oxalate 10 MG tablet Commonly known as: LEXAPRO    metoPROLOL  succinate 50 MG 24 hr tablet Commonly known as: Toprol -XL    multivitamin, prenatal (folic acid -iron) 27-1 mg Tab   norethindrone 0.35 mg tablet Commonly known as: MICRONOR       START taking these medications    hydrOXYzine  25 MG tablet Commonly known as: ATARAX  Take 1 tablet (25 mg total) by mouth two (2) times a day as needed for anxiety.   methIMAzole  10 MG tablet Commonly known as: TAPAZOLE  Take 1 tablet (10 mg total) by mouth daily. Start taking on: July 08, 2024       CHANGE how you take these medications    propranolol  10 MG tablet Commonly known as: INDERAL  Take 1 tablet (10 mg total) by mouth two (2) times a day. What changed:  medication strength how much to take when to take this   risperiDONE  0.5 MG tablet Commonly known as: RisperDAL  Take 1 tablet (0.5 mg total) by mouth nightly. What changed: Another medication with the same name was removed. Continue taking this medication, and follow the directions you see here.        Advanced Directives: Does patient have an advance directive covering medical treatment?: Patient does not have advance directive covering medical treatment. Reason patient does not have an advance directive covering medical treatment:: Patient does not wish to complete one at this time.  Healthcare Decision Maker: Patient does not wish to appoint a Health Care Decision Maker at this time Extended Emergency Contact Information Primary Emergency Contact: [place],Symone Mobile Phone: [phone] Relation: Relative Interpreter needed? No Information offered on HCDM, Medical & Mental Health advance directives:: Patient given information.  Does patient have an advance directive covering mental health treatment?: Patient does not have advance directive covering mental health treatment. Reason patient does not have an advance directive covering mental health treatment:: Patient does not wish to complete one at this time.  Discharge Instructions:   Other Instructions     Discharge  instructions     HOSPITAL TRANSITION INFORMATION*: Reason for admission: You were in the hospital for the voices that were telling you to hurt yourself and for suicidal thoughts. We started the risperidone  which has helped as wel as growing and working on your coping skills.  Discharge Diagnosis:  Principal Problem:   Complex posttraumatic stress  disorder Active Problems:   Graves disease   Test Results: Data Review: HgbA1C- Lab Results - Current Encounter      Component                Value               Date/Time                 A1C                      5.1                 07/02/2024 2111      Lipid Panel - Lab Results - Current Encounter      Component                Value               Date/Time                 CHOL                     163                 07/02/2024 2111           TRIG                     75                  07/02/2024 2111           HDL                      55                  07/02/2024 2111           LDL                      96                  07/02/2024 2111      TSH- Lab Results - Current Encounter      Component                Value               Date/Time                 TSH                      1.079               07/02/2024 2111      Lab Results      Component                Value               Date                      TSH                      1.079               07/02/2024                T3TOTAL  602.6 (H)           11/06/2023            Imaging: None  No results found for this or any previous visit (from the past 4464 hours).   Pending Test Results: No pending test or studies  Advanced Directives: Does patient have an advance directive covering medical treatment?: Patient does not have advance directive covering medical treatment. Reason patient does not have an advance directive covering medical treatment:: Patient does not wish to complete one at this time.  Healthcare Decision Maker: Patient does not wish to appoint a Health  Care Decision Maker at this time Extended Emergency Contact Information Primary Emergency Contact: [place]es[place],Symone Mobile Phone: [phone] Relation: Relative Interpreter needed? No Information offered on HCDM, Medical & Mental Health advance directives:: Patient given information.  Does patient have an advance directive covering mental health treatment?: Patient does not have advance directive covering mental health treatment. Reason patient does not have an advance directive covering mental health treatment:: Patient does not wish to complete one at this time.   Discharge Unit 24-7 Contact Number: [phone] [facility]  DISCHARGE INSTRUCTIONS: -Please continue taking medications as prescribed.   -Please refrain from using illicit substances, as these can affect your mood and could cause anxiety or other concerning symptoms.  -For antipsychotic therapy, your provider will check your weight, lipid panel, and blood glucose (Hemoglobin A1c) periodically  -As a condition of discharge, you agree to follow-up with appropriate outpatient psychiatric providers. You have been provided prescriptions for a limited supply of your medications. All refills will need to be handled by your outpatient provider.  -Do not make changes to your medications, including taking more or less than prescribed, unless under the supervision of your physician. Be aware that some medications may make you feel worse if abruptly stopped.  -Seek further medical care for any increase in symptoms or new symptoms, such as thoughts of wanting to hurt yourself, hurt others, or if you have increased agitation.  Suicide Prevention Resources: National Suicide Prevention Lifeline - after February 17, 2021, dial the 3 digit code 64. Prior to February 17, 2021, call 1-800-273-TALK 843 885 3135) Spanish/Espaol: 253-489-6926  Crisis Text Line Text HOME to (417)275-3311  Suicide Prevention Resource Center arizonafirm.com.ee  National Institutes of  Health http://www.maynard.net/  Substance Abuse and Mental Health Services Administration skateoasis.com.pt   -For immediate concerns, call 911 for emergency medical attention.  -Emergency services are available 24 hours a day at [facility] to get assistance in deciding appropriate care during periods of psychiatric concern.  -Consider a psychiatric advanced directive at http://nrc-pad.org/     Follow Up instructions and Outpatient Referrals    Ambulatory Referral to Endocrinology     Reason for referral: hyperthyroidism, Grave's disease   Specific Services Requested: Thyroid  Disorders   What thyroid  disorder is this for?: Graves' Disease   Does the patient have Atrial Fibrillation/Heart Failure?: No   Has the patient had TSH, Free T4 or T4 labs in the past 12 months? If  not, please order to avoid scheduling delays.: Yes   Is the patient's Free or Total T4 >3x ULN?: No   Discharge instructions      Appointments which have been scheduled for you   walk in or call us  for same day support, Monday through Friday between 8 a.m. and 3 p.m.  [facility] Outpatient Office [facility]  [address] [place], [place] [address] Phone:[phone] Fax: [phone]  Patient was seen and plan of care was discussed with the Attending MD, Dr. Marget , who agrees with the above statement and plan.  I spent greater than 30 minutes in the discharge of this patient.  Elsie ONEIDA Railing, MD

## 2024-07-09 ENCOUNTER — Inpatient Hospital Stay (HOSPITAL_COMMUNITY): Admit: 2024-07-09

## 2024-07-10 ENCOUNTER — Encounter (HOSPITAL_COMMUNITY): Payer: Self-pay

## 2024-07-10 ENCOUNTER — Emergency Department (HOSPITAL_COMMUNITY): Admission: EM | Admit: 2024-07-10 | Discharge: 2024-07-10 | Disposition: A | Discharge status: Home / Self Care

## 2024-07-10 ENCOUNTER — Other Ambulatory Visit: Payer: Self-pay

## 2024-07-10 ENCOUNTER — Emergency Department (HOSPITAL_COMMUNITY)

## 2024-07-10 DIAGNOSIS — Z91010 Allergy to peanuts: Secondary | ICD-10-CM | POA: Insufficient documentation

## 2024-07-10 DIAGNOSIS — J020 Streptococcal pharyngitis: Secondary | ICD-10-CM | POA: Insufficient documentation

## 2024-07-10 DIAGNOSIS — J351 Hypertrophy of tonsils: Secondary | ICD-10-CM | POA: Insufficient documentation

## 2024-07-10 LAB — CBC WITH DIFFERENTIAL/PLATELET
Abs Immature Granulocytes: 0.14 K/uL — ABNORMAL HIGH (ref 0.00–0.07)
Basophils Absolute: 0 K/uL (ref 0.0–0.1)
Basophils Relative: 0 %
Eosinophils Absolute: 0.2 K/uL (ref 0.0–0.5)
Eosinophils Relative: 1 %
HCT: 39.6 % (ref 36.0–46.0)
Hemoglobin: 12.7 g/dL (ref 12.0–15.0)
Immature Granulocytes: 1 %
Lymphocytes Relative: 13 %
Lymphs Abs: 2.5 K/uL (ref 0.7–4.0)
MCH: 26.4 pg (ref 26.0–34.0)
MCHC: 32.1 g/dL (ref 30.0–36.0)
MCV: 82.3 fL (ref 80.0–100.0)
Monocytes Absolute: 1.7 K/uL — ABNORMAL HIGH (ref 0.1–1.0)
Monocytes Relative: 9 %
Neutro Abs: 14.8 K/uL — ABNORMAL HIGH (ref 1.7–7.7)
Neutrophils Relative %: 76 %
Platelets: 192 K/uL (ref 150–400)
RBC: 4.81 MIL/uL (ref 3.87–5.11)
RDW: 14.8 % (ref 11.5–15.5)
WBC: 19.4 K/uL — ABNORMAL HIGH (ref 4.0–10.5)
nRBC: 0 % (ref 0.0–0.2)

## 2024-07-10 LAB — COMPREHENSIVE METABOLIC PANEL WITH GFR
ALT: 26 U/L (ref 0–44)
AST: 28 U/L (ref 15–41)
Albumin: 3.9 g/dL (ref 3.5–5.0)
Alkaline Phosphatase: 71 U/L (ref 38–126)
Anion gap: 12 (ref 5–15)
BUN: 15 mg/dL (ref 6–20)
CO2: 23 mmol/L (ref 22–32)
Calcium: 8.1 mg/dL — ABNORMAL LOW (ref 8.9–10.3)
Chloride: 101 mmol/L (ref 98–111)
Creatinine, Ser: 1.04 mg/dL — ABNORMAL HIGH (ref 0.44–1.00)
GFR, Estimated: >60 mL/min (ref >60)
Glucose, Bld: 80 mg/dL (ref 70–99)
Potassium: 3.7 mmol/L (ref 3.5–5.1)
Sodium: 136 mmol/L (ref 135–145)
Total Bilirubin: 1.8 mg/dL — ABNORMAL HIGH (ref 0.0–1.2)
Total Protein: 7.9 g/dL (ref 6.5–8.1)

## 2024-07-10 LAB — RESP PANEL BY RT-PCR (RSV, FLU A&B, COVID)  RVPGX2
Influenza A by PCR: NEGATIVE
Influenza B by PCR: NEGATIVE
Resp Syncytial Virus by PCR: NEGATIVE
SARS Coronavirus 2 by RT PCR: NEGATIVE

## 2024-07-10 LAB — HCG, SERUM, QUALITATIVE: Preg, Serum: NEGATIVE

## 2024-07-10 LAB — MONONUCLEOSIS SCREEN: Mono Screen: NEGATIVE

## 2024-07-10 LAB — GROUP A STREP BY PCR: Group A Strep by PCR: DETECTED — AB

## 2024-07-10 MED ORDER — ACETAMINOPHEN 160 MG/5ML PO SOLN
650.0000 mg | Freq: Once | ORAL | Status: AC
  Administered 2024-07-10: 650 mg via ORAL
  Filled 2024-07-10: qty 20.3

## 2024-07-10 MED ORDER — IOHEXOL 350 MG/ML SOLN
75.0000 mL | Freq: Once | INTRAVENOUS | Status: AC | PRN
  Administered 2024-07-10: 75 mL via INTRAVENOUS

## 2024-07-10 MED ORDER — SODIUM CHLORIDE 0.9 % IV SOLN
3.0000 g | Freq: Once | INTRAVENOUS | Status: AC
  Administered 2024-07-10: 3 g via INTRAVENOUS
  Filled 2024-07-10: qty 8

## 2024-07-10 MED ORDER — AMOXICILLIN-POT CLAVULANATE 875-125 MG PO TABS: 1.0000 | ORAL_TABLET | Freq: Two times a day (BID) | ORAL | 0 refills | Status: AC

## 2024-07-10 MED ORDER — SODIUM CHLORIDE 0.9 % IV BOLUS
1000.0000 mL | Freq: Once | INTRAVENOUS | Status: AC
  Administered 2024-07-10: 1000 mL via INTRAVENOUS

## 2024-07-10 MED ORDER — DEXAMETHASONE SOD PHOSPHATE PF 10 MG/ML IJ SOLN
10.0000 mg | Freq: Once | INTRAMUSCULAR | Status: AC
  Administered 2024-07-10: 10 mg via INTRAVENOUS

## 2024-07-10 NOTE — ED Provider Notes (Signed)
 [place] EMERGENCY DEPARTMENT AT [facility] Provider Note   CSN: [id number] Arrival date & time: 07/10/24  9285     Patient presents with: Sore Throat   Alison Cummings is a 23 y.o. female.    Sore Throat Pertinent negatives include no chest pain, no abdominal pain and no shortness of breath.   Patient presents because of sore throat.  Over the past 2 days have been increasing size of the tonsils.  Patient feels like she is having a hard time eating or drinking right now because of the tonsil size.  Patient states that she had tonsillitis in the past.  She feels like this is a worse episode.  She is endorsing pain in the oropharynx area.  Endorsing lymphadenopathy of her neck area as well.  Endorsing fevers chills.  No obvious sick contacts.  No chest pain or shortness of breath.  No nausea vomit diarrhea.  Patient states that she was post have a tonsillectomy but subsequently placed in jail  and she also never had follow-up with this   Previous medical history reviewed : Previous notes mention enlarged tonsils at baseline.      Prior to Admission medications   Medication Sig Start Date End Date Taking? Authorizing Provider  amoxicillin -clavulanate (AUGMENTIN ) 875-125 MG tablet Take 1 tablet by mouth every 12 (twelve) hours for 10 days. 07/10/24 07/20/24 Yes Simon Lavonia SAILOR, MD  ARIPiprazole  (ABILIFY ) 5 MG tablet Take 1 tablet (5 mg total) by mouth daily. 01/20/24   Gonfa, Taye T, MD  escitalopram  (LEXAPRO ) 10 MG tablet Take 1 tablet (10 mg total) by mouth daily. 01/20/24   Gonfa, Taye T, MD  famotidine  (PEPCID ) 20 MG tablet Take 1 tablet (20 mg total) by mouth 2 (two) times daily. 05/08/24   Palumbo, April, MD  hydrOXYzine  (ATARAX ) 25 MG tablet Take 1 tablet (25 mg total) by mouth every 6 (six) hours as needed for anxiety. 01/20/24   Gonfa, Taye T, MD  melatonin 3 MG TABS tablet Take 1 tablet (3 mg total) by mouth at bedtime as needed (insomnia). 01/20/24   Gonfa, Taye T, MD   methimazole  (TAPAZOLE ) 10 MG tablet Take 2 tablets (20 mg total) by mouth 2 (two) times daily. 01/20/24   Gonfa, Taye T, MD  nicotine  (NICODERM CQ  - DOSED IN MG/24 HOURS) 14 mg/24hr patch Place 1 patch (14 mg total) onto the skin daily. 01/21/24   Gonfa, Taye T, MD  predniSONE  (DELTASONE ) 20 MG tablet 3 tabs po day one, then 2 po daily x 4 days 05/08/24   Palumbo, April, MD  propranolol  (INDERAL ) 20 MG tablet Take 1 tablet (20 mg total) by mouth 3 (three) times daily. 01/20/24   Gonfa, Taye T, MD    Allergies: Apple, Cherry, Mangifera indica, Peanut (diagnostic), and Ibuprofen     Review of Systems  Constitutional:  Negative for chills and fever.  HENT:  Negative for ear pain and sore throat.   Eyes:  Negative for pain and visual disturbance.  Respiratory:  Negative for cough and shortness of breath.   Cardiovascular:  Negative for chest pain and palpitations.  Gastrointestinal:  Negative for abdominal pain and vomiting.  Genitourinary:  Negative for dysuria and hematuria.  Musculoskeletal:  Negative for arthralgias and back pain.  Skin:  Negative for color change and rash.  Neurological:  Negative for seizures and syncope.  All other systems reviewed and are negative.   Updated Vital Signs BP (!) 100/53   Pulse 93  Temp 98.8 F (37.1 C)   Resp 18   Ht 5' 2 (1.575 m)   Wt 94.3 kg   SpO2 100%   Breastfeeding No   BMI 38.04 kg/m   Physical Exam Vitals and nursing note reviewed.  Constitutional:      General: She is not in acute distress.    Appearance: She is well-developed.  HENT:     Head: Normocephalic and atraumatic.     Mouth/Throat:     Comments: Symmetrical swelling of bilateral tonsils with bilateral exudates  Eyes:     Conjunctiva/sclera: Conjunctivae normal.  Cardiovascular:     Rate and Rhythm: Normal rate and regular rhythm.     Heart sounds: No murmur heard. Pulmonary:     Effort: Pulmonary effort is normal. No respiratory distress.     Breath sounds:  Normal breath sounds.  Abdominal:     Palpations: Abdomen is soft.     Tenderness: There is no abdominal tenderness.  Musculoskeletal:        General: No swelling.     Cervical back: Neck supple.  Skin:    General: Skin is warm and dry.     Capillary Refill: Capillary refill takes less than 2 seconds.  Neurological:     Mental Status: She is alert.  Psychiatric:        Mood and Affect: Mood normal.     (all labs ordered are listed, but only abnormal results are displayed) Labs Reviewed  GROUP A STREP BY PCR - Abnormal; Notable for the following components:      Result Value   Group A Strep by PCR DETECTED (*)    All other components within normal limits  CBC WITH DIFFERENTIAL/PLATELET - Abnormal; Notable for the following components:   WBC 19.4 (*)    Neutro Abs 14.8 (*)    Monocytes Absolute 1.7 (*)    Abs Immature Granulocytes 0.14 (*)    All other components within normal limits  COMPREHENSIVE METABOLIC PANEL WITH GFR - Abnormal; Notable for the following components:   Creatinine, Ser 1.04 (*)    Calcium 8.1 (*)    Total Bilirubin 1.8 (*)    All other components within normal limits  RESP PANEL BY RT-PCR (RSV, FLU A&B, COVID)  RVPGX2  HCG, SERUM, QUALITATIVE  MONONUCLEOSIS SCREEN    EKG: EKG Interpretation Date/Time:  Saturday July 10 2024 08:04:15 EST Ventricular Rate:  93 PR Interval:  119 QRS Duration:  76 QT Interval:  336 QTC Calculation: 418 R Axis:   24  Text Interpretation: Sinus rhythm Borderline short PR interval Confirmed by Simon Rea [phone]) on 07/10/2024 8:12:04 AM  Radiology: CT Soft Tissue Neck W Contrast Result Date: 07/10/2024 EXAM: CT NECK WITH CONTRAST 07/10/2024 10:41:04 AM TECHNIQUE: CT of the neck was performed with the administration of 75 mL of iohexol  (OMNIPAQUE ) 350 MG/ML injection. Multiplanar reformatted images are provided for review. Automated exposure control, iterative reconstruction, and/or weight based adjustment of the  mA/kV was utilized to reduce the radiation dose to as low as reasonably achievable. COMPARISON: None available. CLINICAL HISTORY: Looks like bilateral swelling of the tonsils but very large with muffled voice. Eval for any fluid pockets. FINDINGS: AERODIGESTIVE TRACT: There is tonsillar swelling bilaterally with prominent enhancement, but there is no peritonsillar abscess or drainable fluid collection. No discrete mass. No edema. SALIVARY GLANDS: The parotid and submandibular glands are unremarkable. THYROID : Unremarkable. LYMPH NODES: There is mildly prominent cervical lymphadenopathy, which appears reactive. SOFT TISSUES: No mass or  fluid collection. BRAIN, ORBITS, SINUSES AND MASTOIDS: No acute abnormality. LUNGS AND MEDIASTINUM: No acute abnormality. BONES: No focal bone abnormality. IMPRESSION: 1. Bilateral tonsillar swelling with prominent enhancement compatible with tonsillitis/pharyngitis. No peritonsillar abscess or drainable fluid collection. 2. Mildly prominent cervical lymphadenopathy, appearing reactive. Electronically signed by: Evalene Coho MD 07/10/2024 10:55 AM EST RP Workstation: [id number]     Procedures   Medications Ordered in the ED  dexamethasone  (DECADRON ) injection 10 mg (10 mg Intravenous Given 07/10/24 0829)  Ampicillin -Sulbactam (UNASYN ) 3 g in sodium chloride  0.9 % 100 mL IVPB (0 g Intravenous Stopped 07/10/24 0858)  acetaminophen  (TYLENOL ) 160 MG/5ML solution 650 mg (650 mg Oral Given 07/10/24 0828)  sodium chloride  0.9 % bolus 1,000 mL (0 mLs Intravenous Stopped 07/10/24 0918)  iohexol  (OMNIPAQUE ) 350 MG/ML injection 75 mL (75 mLs Intravenous Contrast Given 07/10/24 1035)                                    Medical Decision Making Amount and/or Complexity of Data Reviewed Labs: ordered. Radiology: ordered.  Risk OTC drugs. Prescription drug management.       HPI:  Patient presents because of sore throat.  Over the past 2 days have been increasing size of  the tonsils.  Patient feels like she is having a hard time eating or drinking right now because of the tonsil size.  Patient states that she had tonsillitis in the past.  She feels like this is a worse episode.  She is endorsing pain in the oropharynx area.  Endorsing lymphadenopathy of her neck area as well.  Endorsing fevers chills.  No obvious sick contacts.  No chest pain or shortness of breath.  No nausea vomit diarrhea.  Patient states that she was post have a tonsillectomy but subsequently placed in jail  and she also never had follow-up with this   Previous medical history reviewed : Previous notes mention enlarged tonsils at baseline.  MDM:   Upon exam, patient ANO x 3 GCS 15.  No focal deficits  Slightly tachycardic in setting of fever.  Will give Tylenol  and reassess.  Enlarged tonsils bilaterally.  Bilateral exudates.  Uvula midline.  Given increase size of tonsils, will give Decadron .  Will also give IV Unasyn  at this moment of time before obtaining lab work as well as CT imaging.  Will make sure there is no evidence of any kind of bilateral peritonsillar abscess which I think is unlikely.  I think this is more likely to be tonsillitis.  She has a documented history of this in the past.  Will obtain mono screen as well.    Reevaluation:   Upon reexamination, patient hemodynamically stable.  Remains A&O x 3 with GCS 15.  Leukocytosis.  No large LFT derangements.  Increased neutrophil count.  All consistent for acute infectious etiology.  Group A strep positive.  Consistent for strep pharyngitis  Did obtain the CT scan.  Shows bilateral enlarged tonsils.  No fluid to drain.  No abscess.   Patient tolerating p.o. here in the ED.  No voice changes.  Able to eat and drink solids.  Protecting airway.  No indication for airway watch at this point time.  I do think she is stable for discharge home.  Start patient on Augmentin  for home.  Referred to ENT for follow-up given history of  recurrent tonsillitis and enlarged tonsils.   Interventions: unasyn , ns bolus, unasyn , decadron   I have independently interpreted the CT  images and agree with the radiologist finding   Social Determinant of Health: Denies smoking history    Disposition and Follow Up: ent        Final diagnoses:  Strep pharyngitis  Enlarged tonsils    ED Discharge Orders          Ordered    amoxicillin -clavulanate (AUGMENTIN ) 875-125 MG tablet  Every 12 hours        07/10/24 1418               Simon Lavonia SAILOR, MD 07/10/24 1450

## 2024-07-10 NOTE — ED Notes (Signed)
Pt given sprite and crackers  

## 2024-07-10 NOTE — Discharge Instructions (Addendum)
 For pain, you can take 1000 mg of Tylenol  or 1 g of Tylenol  every 6-8 hours.  Do not exceed more than 4000 mg or 4 g in a 24-hour period.  You can also take ibuprofen  600 to 800 mg every 6-8 hours as well.  Do not take this high-dose ibuprofen  for greater than a week.  Take the antibiotics as prescribed.   If you ever have any kind of difficulty swallowing or breathing please [place] to ED immediately.  You really need to follow-up with ear nose and throat because they likely need to remove your tonsils.

## 2024-07-10 NOTE — ED Triage Notes (Signed)
 C/O sore throat/chills/shob for 2 days. Denies cough/congestion.

## 2024-07-10 NOTE — ED Notes (Signed)
 Patient transported to CT

## 2024-07-28 ENCOUNTER — Encounter (HOSPITAL_COMMUNITY): Payer: Self-pay

## 2024-07-28 ENCOUNTER — Other Ambulatory Visit: Payer: Self-pay

## 2024-07-28 ENCOUNTER — Emergency Department (HOSPITAL_COMMUNITY)
Admission: EM | Admit: 2024-07-28 | Discharge: 2024-07-29 | Disposition: A | Discharge status: Home / Self Care | Attending: Emergency Medicine | Admitting: Emergency Medicine

## 2024-07-28 DIAGNOSIS — R443 Hallucinations, unspecified: Secondary | ICD-10-CM

## 2024-07-28 DIAGNOSIS — E039 Hypothyroidism, unspecified: Secondary | ICD-10-CM | POA: Diagnosis not present

## 2024-07-28 DIAGNOSIS — Z91010 Allergy to peanuts: Secondary | ICD-10-CM | POA: Diagnosis not present

## 2024-07-28 DIAGNOSIS — R440 Auditory hallucinations: Secondary | ICD-10-CM | POA: Diagnosis present

## 2024-07-28 DIAGNOSIS — I10 Essential (primary) hypertension: Secondary | ICD-10-CM | POA: Diagnosis not present

## 2024-07-28 DIAGNOSIS — F259 Schizoaffective disorder, unspecified: Secondary | ICD-10-CM | POA: Diagnosis not present

## 2024-07-28 DIAGNOSIS — R000 Tachycardia, unspecified: Secondary | ICD-10-CM | POA: Insufficient documentation

## 2024-07-28 DIAGNOSIS — F251 Schizoaffective disorder, depressive type: Secondary | ICD-10-CM | POA: Insufficient documentation

## 2024-07-28 DIAGNOSIS — Z79899 Other long term (current) drug therapy: Secondary | ICD-10-CM | POA: Diagnosis not present

## 2024-07-28 NOTE — ED Triage Notes (Signed)
 Pt POV d/t feeling like bugs are crawling all over her.  Pt knows they are not but just feels that way.  Pt also states voices are talking to her and bothering her but they are not telling her to hurt herself or anyone else.  Pt denies SI/HI

## 2024-07-29 ENCOUNTER — Inpatient Hospital Stay (HOSPITAL_COMMUNITY): Admission: AD | Admit: 2024-07-29 | Discharge: 2024-07-31 | DRG: 885 | Source: Intra-hospital

## 2024-07-29 ENCOUNTER — Encounter (HOSPITAL_COMMUNITY): Payer: Self-pay | Admitting: Behavioral Health

## 2024-07-29 DIAGNOSIS — Z5982 Transportation insecurity: Secondary | ICD-10-CM

## 2024-07-29 DIAGNOSIS — Z91410 Personal history of adult physical and sexual abuse: Secondary | ICD-10-CM

## 2024-07-29 DIAGNOSIS — B9789 Other viral agents as the cause of diseases classified elsewhere: Secondary | ICD-10-CM | POA: Diagnosis present

## 2024-07-29 DIAGNOSIS — F39 Unspecified mood [affective] disorder: Secondary | ICD-10-CM | POA: Diagnosis present

## 2024-07-29 DIAGNOSIS — E0500 Thyrotoxicosis with diffuse goiter without thyrotoxic crisis or storm: Secondary | ICD-10-CM | POA: Diagnosis present

## 2024-07-29 DIAGNOSIS — Z8249 Family history of ischemic heart disease and other diseases of the circulatory system: Secondary | ICD-10-CM | POA: Diagnosis not present

## 2024-07-29 DIAGNOSIS — I10 Essential (primary) hypertension: Secondary | ICD-10-CM | POA: Diagnosis present

## 2024-07-29 DIAGNOSIS — Z8659 Personal history of other mental and behavioral disorders: Secondary | ICD-10-CM | POA: Diagnosis not present

## 2024-07-29 DIAGNOSIS — Z87891 Personal history of nicotine dependence: Secondary | ICD-10-CM | POA: Diagnosis not present

## 2024-07-29 DIAGNOSIS — R45851 Suicidal ideations: Secondary | ICD-10-CM | POA: Diagnosis present

## 2024-07-29 DIAGNOSIS — Z833 Family history of diabetes mellitus: Secondary | ICD-10-CM

## 2024-07-29 DIAGNOSIS — R6884 Jaw pain: Secondary | ICD-10-CM | POA: Diagnosis present

## 2024-07-29 DIAGNOSIS — F251 Schizoaffective disorder, depressive type: Secondary | ICD-10-CM | POA: Insufficient documentation

## 2024-07-29 DIAGNOSIS — F4310 Post-traumatic stress disorder, unspecified: Secondary | ICD-10-CM | POA: Diagnosis present

## 2024-07-29 DIAGNOSIS — F419 Anxiety disorder, unspecified: Secondary | ICD-10-CM | POA: Diagnosis present

## 2024-07-29 DIAGNOSIS — F4325 Adjustment disorder with mixed disturbance of emotions and conduct: Secondary | ICD-10-CM | POA: Diagnosis present

## 2024-07-29 DIAGNOSIS — Z79899 Other long term (current) drug therapy: Secondary | ICD-10-CM | POA: Diagnosis not present

## 2024-07-29 DIAGNOSIS — R000 Tachycardia, unspecified: Secondary | ICD-10-CM | POA: Diagnosis present

## 2024-07-29 DIAGNOSIS — R946 Abnormal results of thyroid function studies: Secondary | ICD-10-CM | POA: Diagnosis not present

## 2024-07-29 DIAGNOSIS — E669 Obesity, unspecified: Secondary | ICD-10-CM | POA: Diagnosis present

## 2024-07-29 DIAGNOSIS — F259 Schizoaffective disorder, unspecified: Secondary | ICD-10-CM

## 2024-07-29 DIAGNOSIS — Z5941 Food insecurity: Secondary | ICD-10-CM

## 2024-07-29 DIAGNOSIS — Z720 Tobacco use: Secondary | ICD-10-CM | POA: Diagnosis not present

## 2024-07-29 DIAGNOSIS — F902 Attention-deficit hyperactivity disorder, combined type: Secondary | ICD-10-CM | POA: Diagnosis present

## 2024-07-29 DIAGNOSIS — F203 Undifferentiated schizophrenia: Secondary | ICD-10-CM | POA: Diagnosis not present

## 2024-07-29 DIAGNOSIS — Z59868 Other specified financial insecurity: Secondary | ICD-10-CM | POA: Diagnosis not present

## 2024-07-29 DIAGNOSIS — Z8639 Personal history of other endocrine, nutritional and metabolic disease: Secondary | ICD-10-CM | POA: Diagnosis not present

## 2024-07-29 DIAGNOSIS — F17210 Nicotine dependence, cigarettes, uncomplicated: Secondary | ICD-10-CM | POA: Diagnosis present

## 2024-07-29 DIAGNOSIS — Z91148 Patient's other noncompliance with medication regimen for other reason: Secondary | ICD-10-CM | POA: Diagnosis not present

## 2024-07-29 DIAGNOSIS — Z91010 Allergy to peanuts: Secondary | ICD-10-CM | POA: Diagnosis not present

## 2024-07-29 DIAGNOSIS — Z6838 Body mass index (BMI) 38.0-38.9, adult: Secondary | ICD-10-CM | POA: Diagnosis not present

## 2024-07-29 DIAGNOSIS — F17200 Nicotine dependence, unspecified, uncomplicated: Secondary | ICD-10-CM | POA: Insufficient documentation

## 2024-07-29 DIAGNOSIS — E0590 Thyrotoxicosis, unspecified without thyrotoxic crisis or storm: Secondary | ICD-10-CM | POA: Diagnosis present

## 2024-07-29 DIAGNOSIS — Z818 Family history of other mental and behavioral disorders: Secondary | ICD-10-CM | POA: Diagnosis not present

## 2024-07-29 DIAGNOSIS — Z5900 Homelessness unspecified: Secondary | ICD-10-CM

## 2024-07-29 DIAGNOSIS — R440 Auditory hallucinations: Secondary | ICD-10-CM | POA: Diagnosis not present

## 2024-07-29 DIAGNOSIS — B348 Other viral infections of unspecified site: Secondary | ICD-10-CM | POA: Diagnosis not present

## 2024-07-29 LAB — CBC
HCT: 37.9 % (ref 36.0–46.0)
Hemoglobin: 12.3 g/dL (ref 12.0–15.0)
MCH: 26.9 pg (ref 26.0–34.0)
MCHC: 32.5 g/dL (ref 30.0–36.0)
MCV: 82.8 fL (ref 80.0–100.0)
Platelets: 140 K/uL — ABNORMAL LOW (ref 150–400)
RBC: 4.58 MIL/uL (ref 3.87–5.11)
RDW: 13.7 % (ref 11.5–15.5)
WBC: 6 K/uL (ref 4.0–10.5)
nRBC: 0 % (ref 0.0–0.2)

## 2024-07-29 LAB — HCG, SERUM, QUALITATIVE: Preg, Serum: NEGATIVE

## 2024-07-29 LAB — COMPREHENSIVE METABOLIC PANEL WITH GFR
ALT: 39 U/L (ref 0–44)
AST: 31 U/L (ref 15–41)
Albumin: 3.9 g/dL (ref 3.5–5.0)
Alkaline Phosphatase: 83 U/L (ref 38–126)
Anion gap: 10 (ref 5–15)
BUN: 22 mg/dL — ABNORMAL HIGH (ref 6–20)
CO2: 22 mmol/L (ref 22–32)
Calcium: 8.8 mg/dL — ABNORMAL LOW (ref 8.9–10.3)
Chloride: 104 mmol/L (ref 98–111)
Creatinine, Ser: 0.7 mg/dL (ref 0.44–1.00)
GFR, Estimated: >60 mL/min
Glucose, Bld: 90 mg/dL (ref 70–99)
Potassium: 4.3 mmol/L (ref 3.5–5.1)
Sodium: 137 mmol/L (ref 135–145)
Total Bilirubin: 0.4 mg/dL (ref 0.0–1.2)
Total Protein: 7.4 g/dL (ref 6.5–8.1)

## 2024-07-29 LAB — URINE DRUG SCREEN
Amphetamines: NEGATIVE
Barbiturates: NEGATIVE
Benzodiazepines: NEGATIVE
Cocaine: NEGATIVE
Fentanyl: NEGATIVE
Methadone Scn, Ur: NEGATIVE
Opiates: NEGATIVE
Tetrahydrocannabinol: NEGATIVE

## 2024-07-29 LAB — ETHANOL: Alcohol, Ethyl (B): <15 mg/dL

## 2024-07-29 LAB — TSH: TSH: <0.1 u[IU]/mL — ABNORMAL LOW (ref 0.350–4.500)

## 2024-07-29 MED ORDER — DIPHENHYDRAMINE HCL 25 MG PO CAPS: 50.0000 mg | ORAL_CAPSULE | Freq: Three times a day (TID) | ORAL | Status: DC | PRN

## 2024-07-29 MED ORDER — HYDROXYZINE HCL 25 MG PO TABS: 25.0000 mg | ORAL_TABLET | Freq: Two times a day (BID) | ORAL | Status: DC | PRN

## 2024-07-29 MED ORDER — ALUM & MAG HYDROXIDE-SIMETH 200-200-20 MG/5ML PO SUSP: 30.0000 mL | ORAL | Status: DC | PRN

## 2024-07-29 MED ORDER — RISPERIDONE 1 MG PO TABS
0.5000 mg | ORAL_TABLET | Freq: Two times a day (BID) | ORAL | Status: DC
  Administered 2024-07-29: 0.5 mg via ORAL
  Filled 2024-07-29: qty 1

## 2024-07-29 MED ORDER — HYDROXYZINE HCL 25 MG PO TABS
25.0000 mg | ORAL_TABLET | Freq: Two times a day (BID) | ORAL | Status: DC | PRN
  Administered 2024-07-30 – 2024-07-31 (×2): 25 mg via ORAL
  Filled 2024-07-29 (×2): qty 1

## 2024-07-29 MED ORDER — TRAZODONE HCL 50 MG PO TABS
50.0000 mg | ORAL_TABLET | Freq: Every evening | ORAL | Status: DC | PRN
  Filled 2024-07-29: qty 1

## 2024-07-29 MED ORDER — HALOPERIDOL LACTATE 5 MG/ML IJ SOLN: 10.0000 mg | Freq: Three times a day (TID) | INTRAMUSCULAR | Status: DC | PRN

## 2024-07-29 MED ORDER — LORAZEPAM 2 MG/ML IJ SOLN: 2.0000 mg | Freq: Three times a day (TID) | INTRAMUSCULAR | Status: DC | PRN

## 2024-07-29 MED ORDER — DIPHENHYDRAMINE HCL 50 MG/ML IJ SOLN: 50.0000 mg | Freq: Three times a day (TID) | INTRAMUSCULAR | Status: DC | PRN

## 2024-07-29 MED ORDER — INFLUENZA VIRUS VACC SPLIT PF (FLUZONE) 0.5 ML IM SUSY
0.5000 mL | PREFILLED_SYRINGE | INTRAMUSCULAR | Status: DC
  Filled 2024-07-29: qty 0.5

## 2024-07-29 MED ORDER — HALOPERIDOL LACTATE 5 MG/ML IJ SOLN: 5.0000 mg | Freq: Three times a day (TID) | INTRAMUSCULAR | Status: DC | PRN

## 2024-07-29 MED ORDER — HALOPERIDOL 5 MG PO TABS: 5.0000 mg | ORAL_TABLET | Freq: Three times a day (TID) | ORAL | Status: DC | PRN

## 2024-07-29 MED ORDER — PROPRANOLOL HCL 10 MG PO TABS
10.0000 mg | ORAL_TABLET | Freq: Three times a day (TID) | ORAL | Status: DC
  Administered 2024-07-29: 10 mg via ORAL
  Filled 2024-07-29 (×4): qty 1

## 2024-07-29 MED ORDER — ACETAMINOPHEN 325 MG PO TABS
650.0000 mg | ORAL_TABLET | Freq: Four times a day (QID) | ORAL | Status: DC | PRN
  Administered 2024-07-29 – 2024-07-30 (×2): 650 mg via ORAL
  Filled 2024-07-29 (×3): qty 2

## 2024-07-29 MED ORDER — METHIMAZOLE 5 MG PO TABS
20.0000 mg | ORAL_TABLET | Freq: Two times a day (BID) | ORAL | Status: DC
  Administered 2024-07-29 – 2024-07-31 (×4): 20 mg via ORAL
  Filled 2024-07-29 (×4): qty 4

## 2024-07-29 MED ORDER — METHIMAZOLE 10 MG PO TABS
20.0000 mg | ORAL_TABLET | Freq: Two times a day (BID) | ORAL | Status: DC
  Administered 2024-07-29: 20 mg via ORAL
  Filled 2024-07-29: qty 4
  Filled 2024-07-29 (×2): qty 2

## 2024-07-29 MED ORDER — RISPERIDONE 0.5 MG PO TABS
0.5000 mg | ORAL_TABLET | Freq: Two times a day (BID) | ORAL | Status: DC
  Administered 2024-07-29 – 2024-07-31 (×4): 0.5 mg via ORAL
  Filled 2024-07-29 (×4): qty 1

## 2024-07-29 MED ORDER — RISPERIDONE 1 MG PO TABS: 0.5000 mg | ORAL_TABLET | Freq: Every day | ORAL | Status: DC

## 2024-07-29 MED ORDER — PNEUMOCOCCAL 20-VAL CONJ VACC 0.5 ML IM SUSY
0.5000 mL | PREFILLED_SYRINGE | INTRAMUSCULAR | Status: DC
  Filled 2024-07-29: qty 0.5

## 2024-07-29 MED ORDER — MAGNESIUM HYDROXIDE 400 MG/5ML PO SUSP: 30.0000 mL | Freq: Every day | ORAL | Status: DC | PRN

## 2024-07-29 MED ORDER — PROPRANOLOL HCL 10 MG PO TABS
10.0000 mg | ORAL_TABLET | Freq: Three times a day (TID) | ORAL | Status: DC
  Administered 2024-07-29 – 2024-07-31 (×5): 10 mg via ORAL
  Filled 2024-07-29 (×5): qty 1

## 2024-07-29 NOTE — Group Note (Addendum)
 Date:  07/29/2024 Time:  8:10 PM  Group Topic/Focus:  Dimensions of Wellness:   The focus of this group is to introduce the topic of wellness and discuss the role each dimension of wellness plays in total health. Example: How our gut flora affects our mood.     Participation Level:  Did Not Attend   Berwyn GORMAN Acosta 07/29/2024, 8:10 PM

## 2024-07-29 NOTE — ED Notes (Signed)
 Pt sleeping at this time. Rise and fall of chest noted. Pt in NAD at this time. Will continue to monitor.

## 2024-07-29 NOTE — Progress Notes (Signed)
 Pt has been accepted to [facility] on 07/29/2024 Bed assignment: 306-01  Pt meets inpatient criteria per: Wyline Pizza NP  Attending Physician will be: Dr. Prentis    Report can be called to: Adult unit: [phone]  Pt can arrive after [facility] WILL UPDATE   Care Team Notified: [facility] RN, Wyline Pizza NP  [place] Maeghan Canny LCSW-A   07/29/2024 12:35 PM

## 2024-07-29 NOTE — BH Assessment (Signed)
 At 0432, Ozell, RN to check if the pt is medically cleared. Clinician is awaiting a response.  At [phone], Per Ozell, RN the is medically cleared. Clinician expressed that the pt will be seen during the day shift.   [name], MS, [facility], [facility] Triage Specialist [phone]

## 2024-07-29 NOTE — Progress Notes (Signed)
(  Sleep Hours) -note 1 of 2  (Any PRNs that were needed, meds refused, or side effects to meds)- acetaminophen  650mg   (Any disturbances and when (visitation, over night)-none  (Concerns raised by the patient)- still with some throat discomfort after completing antibiotics for strep a short while ago  (SI/HI/AVH)-admits continued SI with no plan/ denies HI and hallucinations

## 2024-07-29 NOTE — Plan of Care (Deleted)

## 2024-07-29 NOTE — ED Notes (Signed)
 Patient changed into blue scrubs at this time. Asked security to wand patient. 1 belongings bag placed in locker #4.
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
## 2024-07-29 NOTE — ED Notes (Signed)
 Report given to Mercie, CHARITY FUNDRAISER, at behavioral health unit. Mercie requested patient be sent after 3 pm.

## 2024-07-29 NOTE — Plan of Care (Signed)
" °  Problem: Education: Goal: Knowledge of [facility] Education information/materials will improve 07/29/2024 2026 by Shireen Mercie DEL, RN Outcome: Progressing 07/29/2024 1912 by Shireen Mercie DEL, RN Outcome: Progressing Goal: Emotional status will improve 07/29/2024 2026 by Shireen Mercie DEL, RN Outcome: Progressing 07/29/2024 1912 by Shireen Mercie DEL, RN Outcome: Progressing Goal: Mental status will improve 07/29/2024 2026 by Shireen Mercie DEL, RN Outcome: Progressing 07/29/2024 1912 by Shireen Mercie DEL, RN Outcome: Progressing Goal: Verbalization of understanding the information provided will improve 07/29/2024 2026 by Shireen Mercie DEL, RN Outcome: Progressing 07/29/2024 1912 by Shireen Mercie DEL, RN Outcome: Progressing   Problem: Activity: Goal: Interest or engagement in activities will improve 07/29/2024 2026 by Shireen Mercie DEL, RN Outcome: Progressing 07/29/2024 1912 by Shireen Mercie DEL, RN Outcome: Progressing Goal: Sleeping patterns will improve 07/29/2024 2026 by Shireen Mercie DEL, RN Outcome: Progressing 07/29/2024 1912 by Shireen Mercie DEL, RN Outcome: Progressing   Problem: Coping: Goal: Ability to verbalize frustrations and anger appropriately will improve 07/29/2024 2026 by Shireen Mercie DEL, RN Outcome: Progressing 07/29/2024 1912 by Shireen Mercie DEL, RN Outcome: Progressing Goal: Ability to demonstrate self-control will improve 07/29/2024 2026 by Shireen Mercie DEL, RN Outcome: Progressing 07/29/2024 1912 by Shireen Mercie DEL, RN Outcome: Progressing   Problem: Health Behavior/Discharge Planning: Goal: Identification of resources available to assist in meeting health care needs will improve 07/29/2024 2026 by Shireen Mercie DEL, RN Outcome: Progressing 07/29/2024 1912 by Shireen Mercie DEL, RN Outcome: Progressing Goal: Compliance with treatment plan for underlying cause of condition will improve 07/29/2024 2026 by Shireen Mercie DEL, RN Outcome: Progressing 07/29/2024  1912 by Shireen Mercie DEL, RN Outcome: Progressing   Problem: Physical Regulation: Goal: Ability to maintain clinical measurements within normal limits will improve 07/29/2024 2026 by Shireen Mercie DEL, RN Outcome: Progressing 07/29/2024 1912 by Shireen Mercie DEL, RN Outcome: Progressing   Problem: Safety: Goal: Periods of time without injury will increase 07/29/2024 2026 by Shireen Mercie DEL, RN Outcome: Progressing 07/29/2024 1912 by Shireen Mercie DEL, RN Outcome: Progressing   "

## 2024-07-29 NOTE — BH Assessment (Signed)
 Clinician messaged Alison Cummings. [place], Alison Cummings: Alison Cummings. It's Alison Cummings with TTS. Is the pt able to engage in the assessment, if so the pt will need to be placed in a private room. Is the pt under IVC? Also is the pt medically cleared?   Clinician is awaiting a response.   [name], MS, [facility], [facility] Triage Specialist [phone]

## 2024-07-29 NOTE — ED Provider Notes (Signed)
 " [place] EMERGENCY DEPARTMENT AT [facility] Provider Note   CSN: [id number] Arrival date & time: 07/28/24  2327     Patient presents with: Hallucinations   Alison Cummings is a Alison y.o. Cummings.   Alison Cummings with past medical history of ADHD, depression, hypertension, anxiety, hyperthyroid and mood disorder presents emergency room with complaint of feeling there is bugs crawling on her and auditory hallucinations telling her to hurt herself.  Patient states that she was recently admitted to [facility] for similar symptoms, improved with medications and was discharged.  She is currently unhoused, taking her medications as prescribed without improvement in her symptoms.  She denies homicidal ideation.  She is concerned she may harm herself.  No other complaints or concerns today.       Prior to Admission medications  Medication Sig Start Date End Date Taking? Authorizing Provider  ARIPiprazole  (ABILIFY ) 5 MG tablet Take 1 tablet (5 mg total) by mouth daily. 01/20/24   Gonfa, Taye T, MD  escitalopram  (LEXAPRO ) 10 MG tablet Take 1 tablet (10 mg total) by mouth daily. 01/20/24   Gonfa, Taye T, MD  famotidine  (PEPCID ) 20 MG tablet Take 1 tablet (20 mg total) by mouth 2 (two) times daily. 05/08/24   Palumbo, April, MD  hydrOXYzine  (ATARAX ) 25 MG tablet Take 1 tablet (25 mg total) by mouth every 6 (six) hours as needed for anxiety. 01/20/24   Gonfa, Taye T, MD  melatonin 3 MG TABS tablet Take 1 tablet (3 mg total) by mouth at bedtime as needed (insomnia). 01/20/24   Gonfa, Taye T, MD  methimazole  (TAPAZOLE ) 10 MG tablet Take 2 tablets (20 mg total) by mouth 2 (two) times daily. 01/20/24   Gonfa, Taye T, MD  nicotine  (NICODERM CQ  - DOSED IN MG/24 HOURS) 14 mg/24hr patch Place 1 patch (14 mg total) onto the skin daily. 01/21/24   Gonfa, Taye T, MD  predniSONE  (DELTASONE ) 20 MG tablet 3 tabs po day one, then 2 po daily x 4 days 05/08/24   Palumbo, April, MD  propranolol  (INDERAL ) 20 MG  tablet Take 1 tablet (20 mg total) by mouth 3 (three) times daily. 01/20/24   Gonfa, Taye T, MD    Allergies: Apple, Cherry, Mangifera indica, Peanut (diagnostic), and Ibuprofen     Review of Systems Negative except as per HPI Updated Vital Signs BP 110/66 (BP Location: Right Arm)   Pulse (!) 123   Temp 98.9 F (37.2 C) (Oral)   Resp 18   Ht 5' 2 (1.575 m)   Wt 99.8 kg   LMP 07/09/2024   SpO2 98%   BMI 40.24 kg/m   Physical Exam Vitals and nursing note reviewed.  Constitutional:      General: She is not in acute distress.    Appearance: She is well-developed. She is not diaphoretic.  HENT:     Head: Normocephalic and atraumatic.  Pulmonary:     Effort: Pulmonary effort is normal.  Neurological:     Mental Status: She is alert and oriented to person, place, and time.  Psychiatric:        Behavior: Behavior normal.     (all labs ordered are listed, but only abnormal results are displayed) Labs Reviewed  COMPREHENSIVE METABOLIC PANEL WITH GFR - Abnormal; Notable for the following components:      Result Value   BUN Alison (*)    Calcium 8.8 (*)    All other components within normal limits  CBC - Abnormal; Notable  for the following components:   Platelets 140 (*)    All other components within normal limits  ETHANOL  URINE DRUG SCREEN  HCG, SERUM, QUALITATIVE  TSH    EKG: None  Radiology: No results found.   Procedures   Medications Ordered in the ED - No data to display                                  Medical Decision Making Amount and/or Complexity of Data Reviewed Labs: ordered.   This patient presents to the ED for concern of auditory hallucinations, this involves an extensive number of treatment options, and is a complaint that carries with it a high risk of complications and morbidity.  The differential diagnosis includes but not limited to psychosis, thyroid  disorder, malingering   Co morbidities / Chronic conditions that complicate the patient  evaluation  As listed per HPI   Additional history obtained:  Additional history obtained from EMR External records from outside source obtained and reviewed including recent discharge summary dated 07/07/2024   Lab Tests:  I Ordered, and personally interpreted labs.  The pertinent results include: hCG negative.  CBC without significant findings.  CMP without significant findings.  UDS negative.  Alcohol negative.  TSH pending at time of signout  Problem List / ED Course / Critical interventions / Medication management  Alison Cummings presents emergency room with complaint of feeling with there are bugs calling on her skin as well as auditory hallucinations telling her to harm herself.  She was recently admitted to [facility] for same, provided with medications, stabilized and discharged.  She has not had any follow-up since that time and reports compliance with her medications.  Her urine drug screen is negative.  Patient is tachycardic with history of hypothyroid.  TSH pending.  Patient is medically cleared for behavioral health evaluation and disposition. I have reviewed the patients home medicines and have made adjustments as needed   Consultations Obtained:  I requested consultation with the behavioral health team,  and discussed lab and imaging findings as well as pertinent plan - they recommend: Consult pending Medically cleared for behavioral health evaluation disposition   Social Determinants of Health:  Unhoused, has a sister who lives in the area but is currently out of town   Test / Admission - Considered:  Disposition pending behavioral health evaluation      Final diagnoses:  None    ED Discharge Orders     None          Beverley Leita DELENA DEVONNA 07/29/24 [id number]  "
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
## 2024-07-29 NOTE — ED Notes (Signed)
Called safe transport to transport pt to [facility].

## 2024-07-29 NOTE — Group Note (Signed)
 Date:  07/29/2024 Time:  9:27 PM  Group Topic/Focus:  Emotional Education:   The focus of this group is to discuss what feelings/emotions are, and how they are experienced.    Pt did not attend group.  Rushil Kimbrell L 07/29/2024, 9:27 PM

## 2024-07-29 NOTE — Plan of Care (Signed)
   Problem: Education: Goal: Knowledge of [facility] [facility] Education information/materials will improve Outcome: Progressing Goal: Emotional status will improve Outcome: Progressing Goal: Mental status will improve Outcome: Progressing

## 2024-07-30 ENCOUNTER — Encounter (HOSPITAL_COMMUNITY): Payer: Self-pay

## 2024-07-30 DIAGNOSIS — F17200 Nicotine dependence, unspecified, uncomplicated: Secondary | ICD-10-CM | POA: Insufficient documentation

## 2024-07-30 LAB — RESPIRATORY PANEL BY PCR

## 2024-07-30 LAB — T4, FREE: Free T4: 5.13 ng/dL — ABNORMAL HIGH (ref 0.80–2.00)

## 2024-07-30 LAB — SARS CORONAVIRUS 2 BY RT PCR: SARS Coronavirus 2 by RT PCR: NEGATIVE

## 2024-07-30 MED ORDER — NICOTINE POLACRILEX 2 MG MT GUM: 2.0000 mg | CHEWING_GUM | OROMUCOSAL | Status: DC | PRN

## 2024-07-30 MED ORDER — GUAIFENESIN ER 600 MG PO TB12
600.0000 mg | ORAL_TABLET | Freq: Two times a day (BID) | ORAL | Status: DC
  Administered 2024-07-30 – 2024-07-31 (×3): 600 mg via ORAL
  Filled 2024-07-30 (×4): qty 1

## 2024-07-30 MED ORDER — FLUOXETINE HCL 20 MG PO CAPS
20.0000 mg | ORAL_CAPSULE | Freq: Every day | ORAL | Status: DC
  Administered 2024-07-30 – 2024-07-31 (×2): 20 mg via ORAL
  Filled 2024-07-30 (×2): qty 1

## 2024-07-30 MED ORDER — MENTHOL 3 MG MT LOZG
1.0000 | LOZENGE | OROMUCOSAL | Status: DC | PRN
  Administered 2024-07-31: 3 mg via ORAL
  Filled 2024-07-30: qty 9

## 2024-07-30 MED ORDER — PHENOL 1.4 % MT LIQD: 1.0000 | OROMUCOSAL | Status: DC | PRN
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
## 2024-07-30 NOTE — Group Note (Signed)
 Date:  07/30/2024 Time:  3:58 PM  Group Topic/Focus: Social Wellness Wellness Toolbox:   The focus of this group is to discuss various aspects of wellness, balancing those aspects and exploring ways to increase the ability to experience wellness.  Patients will create a wellness toolbox for use upon discharge.    Participation Level:  Did Not Attend  Alison Cummings 07/30/2024, 3:58 PM

## 2024-07-30 NOTE — [facility] Suicide Risk Assessment (Signed)
 [facility] INPATIENT:  Family/Significant Other Suicide Prevention Education  Suicide Prevention Education:  Patient Refusal for Family/Significant Other Suicide Prevention Education: The patient Alison Cummings has refused to provide written consent for family/significant other to be provided Family/Significant Other Suicide Prevention Education during admission and/or prior to discharge.  Physician notified.  Derick JONELLE Blanch, LCSW 07/30/2024, 12:02 PM

## 2024-07-30 NOTE — Group Note (Signed)
 Recreation Therapy Group Note   Group Topic:Leisure Education  Group Date: 07/30/2024 Start Time: 0935 End Time: 1005 Facilitators: Ismaeel Arvelo-McCall, LRT,CTRS Location: [address] Dayroom   Group Topic: Communication, Team Building, Problem Solving  Goal Area(s) Addresses:  Patient will effectively work with peer towards shared goal.  Patient will identify skills used to make activity successful.  Patient will identify how skills used during activity can be used to reach post d/c goals.   Behavioral Response:   Intervention: STEM Activity  Activity: Straw Bridge. In teams of 3-5, patients were given 15 plastic drinking straws and an equal length of masking tape. Using the materials provided, patients were instructed to build a free standing bridge-like structure to suspend an everyday item (ex: puzzle box) off of the floor or table surface. All materials were required to be used by the team in their design. LRT facilitated post-activity discussion reviewing team process. Patients were encouraged to reflect how the skills used in this activity can be generalized to daily life post discharge.   Education: Pharmacist, Community, Scientist, Physiological, Discharge Planning   Education Outcome: Acknowledges education/In group clarification offered/Needs additional education.    Affect/Mood: N/A   Participation Level: Did not attend    Clinical Observations/Individualized Feedback:      Plan: Continue to engage patient in RT group sessions 2-3x/week.   Alison Cummings, LRT,CTRS  07/30/2024 11:35 AM

## 2024-07-30 NOTE — Plan of Care (Signed)
   Problem: Education: Goal: Emotional status will improve Outcome: Not Progressing Goal: Mental status will improve Outcome: Not Progressing

## 2024-07-30 NOTE — Group Note (Signed)
 Date:  07/30/2024 Time:  4:43 PM  Group Topic/Focus:  Overcoming Stress:   The focus of this group is to define stress and help patients assess their triggers. Identifying Stressors: External and Internal What Kind of Stressors Brought You in to the Hospital and What Kind of Coping Skills Can You Use to Alleviate Them?     Participation Level:  Did Not Attend    Inocente PARAS [facility] 07/30/2024, 4:43 PM
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
## 2024-07-30 NOTE — Progress Notes (Signed)
(  Sleep Hours) - 6.75 (Any PRNs that were needed, meds refused, or side effects to meds)- none (Any disturbances and when (visitation, over night)- none  (Concerns raised by the patient)- none (SI/HI/AVH)- denies
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
## 2024-07-30 NOTE — BH IP Treatment Plan (Signed)
 Interdisciplinary Treatment and Diagnostic Plan Update  07/30/2024 Time of Session: 10:05 AM Alison Cummings MRN: [id number]  Principal Diagnosis: Schizoaffective disorder, depressive type (HCC)  Secondary Diagnoses: Principal Problem:   Schizoaffective disorder, depressive type (HCC) Active Problems:   PTSD (post-traumatic stress disorder)   Tobacco use disorder   Current Medications:  Current Facility-Administered Medications  Medication Dose Route Frequency Provider Last Rate Last Admin   acetaminophen  (TYLENOL ) tablet 650 mg  650 mg Oral Q6H PRN White, Patrice L, NP   650 mg at 07/29/24 2017   alum & mag hydroxide-simeth (MAALOX/MYLANTA) 200-200-20 MG/5ML suspension 30 mL  30 mL Oral Q4H PRN White, Patrice L, NP       haloperidol  (HALDOL ) tablet 5 mg  5 mg Oral TID PRN White, Patrice L, NP       And   diphenhydrAMINE  (BENADRYL ) capsule 50 mg  50 mg Oral TID PRN White, Patrice L, NP       haloperidol  lactate (HALDOL ) injection 5 mg  5 mg Intramuscular TID PRN White, Patrice L, NP       And   diphenhydrAMINE  (BENADRYL ) injection 50 mg  50 mg Intramuscular TID PRN White, Patrice L, NP       And   LORazepam  (ATIVAN ) injection 2 mg  2 mg Intramuscular TID PRN White, Patrice L, NP       haloperidol  lactate (HALDOL ) injection 10 mg  10 mg Intramuscular TID PRN White, Patrice L, NP       And   diphenhydrAMINE  (BENADRYL ) injection 50 mg  50 mg Intramuscular TID PRN White, Patrice L, NP       And   LORazepam  (ATIVAN ) injection 2 mg  2 mg Intramuscular TID PRN White, Patrice L, NP       FLUoxetine  (PROZAC ) capsule 20 mg  20 mg Oral Daily Carrion-Carrero, Margely, MD   20 mg at 07/30/24 1143   guaiFENesin  (MUCINEX ) 12 hr tablet 600 mg  600 mg Oral BID Carrion-Carrero, Marlo, MD   600 mg at 07/30/24 1143   hydrOXYzine  (ATARAX ) tablet 25 mg  25 mg Oral BID PRN White, Patrice L, NP       influenza vac split trivalent PF (FLUZONE ) injection 0.5 mL  0.5 mL Intramuscular Tomorrow-1000  Bouchard, Marc A, DO       magnesium  hydroxide (MILK OF MAGNESIA) suspension 30 mL  30 mL Oral Daily PRN White, Patrice L, NP       methimazole  (TAPAZOLE ) tablet 20 mg  20 mg Oral BID White, Patrice L, NP   20 mg at 07/30/24 1709   nicotine  polacrilex (NICORETTE ) gum 2 mg  2 mg Oral PRN Carrion-Carrero, Marlo, MD       pneumococcal 20-valent conjugate vaccine (PREVNAR 20 ) injection 0.5 mL  0.5 mL Intramuscular Tomorrow-1000 Bouchard, Marc A, DO       propranolol  (INDERAL ) tablet 10 mg  10 mg Oral TID White, Patrice L, NP   10 mg at 07/30/24 1708   risperiDONE  (RISPERDAL ) tablet 0.5 mg  0.5 mg Oral BID White, Patrice L, NP   0.5 mg at 07/30/24 1708   traZODone  (DESYREL ) tablet 50 mg  50 mg Oral QHS PRN White, Patrice L, NP       PTA Medications: Medications Prior to Admission  Medication Sig Dispense Refill Last Dose/Taking   hydrOXYzine  (ATARAX ) 25 MG tablet Take 25 mg by mouth 2 (two) times daily as needed for anxiety or itching.      methimazole  (TAPAZOLE ) 10 MG  tablet Take 2 tablets (20 mg total) by mouth 2 (two) times daily. 360 tablet 0    propranolol  (INDERAL ) 10 MG tablet Take 10 mg by mouth 3 (three) times daily.      risperiDONE  (RISPERDAL ) 0.5 MG tablet Take 0.5 mg by mouth at bedtime.       Patient Stressors: Marital or family conflict   Traumatic event    Patient Strengths: Ability for insight  General fund of knowledge  Supportive family/friends   Treatment Modalities: Medication Management, Group therapy, Case management,  1 to 1 session with clinician, Psychoeducation, Recreational therapy.   Physician Treatment Plan for Primary Diagnosis: Schizoaffective disorder, depressive type (HCC) Long Term Goal(s):     Short Term Goals:    Medication Management: Evaluate patient's response, side effects, and tolerance of medication regimen.  Therapeutic Interventions: 1 to 1 sessions, Unit Group sessions and Medication administration.  Evaluation of Outcomes: Not  Progressing  Physician Treatment Plan for Secondary Diagnosis: Principal Problem:   Schizoaffective disorder, depressive type (HCC) Active Problems:   PTSD (post-traumatic stress disorder)   Tobacco use disorder  Long Term Goal(s):     Short Term Goals:       Medication Management: Evaluate patient's response, side effects, and tolerance of medication regimen.  Therapeutic Interventions: 1 to 1 sessions, Unit Group sessions and Medication administration.  Evaluation of Outcomes: Not Progressing   RN Treatment Plan for Primary Diagnosis: Schizoaffective disorder, depressive type (HCC) Long Term Goal(s): Knowledge of disease and therapeutic regimen to maintain health will improve  Short Term Goals: Ability to remain free from injury will improve, Ability to verbalize frustration and anger appropriately will improve, Ability to demonstrate self-control, Ability to participate in decision making will improve, Ability to verbalize feelings will improve, Ability to disclose and discuss suicidal ideas, Ability to identify and develop effective coping behaviors will improve, and Compliance with prescribed medications will improve  Medication Management: RN will administer medications as ordered by provider, will assess and evaluate patient's response and provide education to patient for prescribed medication. RN will report any adverse and/or side effects to prescribing provider.  Therapeutic Interventions: 1 on 1 counseling sessions, Psychoeducation, Medication administration, Evaluate responses to treatment, Monitor vital signs and CBGs as ordered, Perform/monitor CIWA, COWS, AIMS and Fall Risk screenings as ordered, Perform wound care treatments as ordered.  Evaluation of Outcomes: Not Progressing   LCSW Treatment Plan for Primary Diagnosis: Schizoaffective disorder, depressive type (HCC) Long Term Goal(s): Safe transition to appropriate next level of care at discharge, Engage patient in  therapeutic group addressing interpersonal concerns.  Short Term Goals: Engage patient in aftercare planning with referrals and resources, Increase social support, Increase ability to appropriately verbalize feelings, Increase emotional regulation, Facilitate acceptance of mental health diagnosis and concerns, Facilitate patient progression through stages of change regarding substance use diagnoses and concerns, Identify triggers associated with mental health/substance abuse issues, and Increase skills for wellness and recovery  Therapeutic Interventions: Assess for all discharge needs, 1 to 1 time with Social worker, Explore available resources and support systems, Assess for adequacy in community support network, Educate family and significant other(s) on suicide prevention, Complete Psychosocial Assessment, Interpersonal group therapy.  Evaluation of Outcomes: Not Progressing   Progress in Treatment: Attending groups: No. Participating in groups: No. Taking medication as prescribed: Yes. Toleration medication: Yes. Family/Significant other contact made: No, will contact:  patient declined to provide consents at this time Patient understands diagnosis: Yes. Discussing patient identified problems/goals with staff: Yes. Medical problems  stabilized or resolved: Yes. Denies suicidal/homicidal ideation: Yes. Issues/concerns per patient self-inventory: No.  New problem(s) identified:  No  New Short Term/Long Term Goal(s):     medication stabilization, elimination of SI thoughts, development of comprehensive mental wellness plan.   Patient Goals:  I want to develop coping skills for managing stress.  Discharge Plan or Barriers:  Patient recently admitted. CSW will continue to follow and assess for appropriate referrals and possible discharge planning.    Reason for Continuation of Hospitalization: Depression Hallucinations Medication stabilization  Estimated Length of Stay:  5 - 7  days  Last 3 [place] Suicide Severity Risk Score: Flowsheet Row Ad[facility] (Current) [facility] from 07/28/2024 in [facility] Department at [facility] ED from 07/10/2024 in [facility] Department at [facility]  C-SSRS RISK CATEGORY Low Risk Low Risk No Risk    Last [facility] 2/9 Scores:    06/11/2023   10:37 AM  Depression screen PHQ 2/9  Decreased Interest 0  Down, Depressed, Hopeless 0  PHQ - 2 Score 0  Altered sleeping 1  Tired, decreased energy 1  Change in appetite 0  Feeling bad or failure about yourself  0  Trouble concentrating 3  Moving slowly or fidgety/restless 3  Suicidal thoughts 0  PHQ-9 Score 8   Difficult doing work/chores Not difficult at all     Data saved with a previous flowsheet row definition    Scribe for Treatment Team: Colin Ellers O [place] Giammona, LCSWA 07/30/2024 7:12 PM

## 2024-07-30 NOTE — Group Note (Signed)
 Date:  07/30/2024 Time:  10:51 AM  Group Topic/Focus: goal and orientation Goals Group:   The focus of this group is to help patients establish daily goals to achieve during treatment and discuss how the patient can incorporate goal setting into their daily lives to aide in recovery. Orientation:   The focus of this group is to educate the patient on the purpose and policies of crisis stabilization and provide a format to answer questions about their admission.  The group details unit policies and expectations of patients while admitted.    Participation Level:  Did Not Attend   Alison Cummings 07/30/2024, 10:51 AM

## 2024-07-30 NOTE — Progress Notes (Signed)
" °   07/30/24 0900  Psych Admission Type (Psych Patients Only)  Admission Status Voluntary  Psychosocial Assessment  Patient Complaints Depression  Eye Contact Fair  Facial Expression Flat  Affect Irritable  Speech UTA  Interaction Avoidant  Motor Activity Other (Comment)  Appearance/Hygiene Unremarkable  Behavior Characteristics Cooperative  Mood Depressed  Thought Process  Coherency WDL  Content WDL  Delusions None reported or observed  Perception UTA  Hallucination None reported or observed  Judgment Poor  Confusion None  Danger to Self  Current suicidal ideation? Denies  Danger to Others  Danger to Others None reported or observed   Dar Note: Patient presents with irritable affect and mood during medication administration this morning.  Endorses passive SI but verbally contracts for safety.  Medications given as prescribed.  Routine safety checks maintained.  Refused throat swab to rule out strep A.  MD made aware.  Patient place on droplet precaution pending result for Covid and Influenza.  Patient is safe on the unit. "

## 2024-07-30 NOTE — [facility] Group Notes (Signed)
 Alison Cummings did not attend aa wrap up group

## 2024-07-30 NOTE — Group Note (Signed)
 Date:  07/30/2024 Time:  11:24 AM  Group Topic/Focus: Recreational Therapy Group Topic/Focus: Recreational Therapy Critical thinking in recreational therapy for mental health patients is essential for providing personalized, effective care. It enables therapists to assess each patient's unique needs, goals, and abilities, ensuring that interventions are tailored to promote emotional, cognitive, and social well-being. By critically evaluating patients' interests and challenges, therapists can select appropriate activities that align with therapeutic goals, adapt them as needed, and troubleshoot barriers to participation. Critical thinking also helps therapists track progress and make necessary adjustments to ensure activities are effective. Additionally, it encourages patients to develop self-reflection and problem-solving skills, empowering them to gain insight into their own behaviors and emotions. Overall, critical thinking in recreational therapy fosters a flexible, responsive approach that supports positive mental health outcomes and helps patients achieve greater independence and coping skills.     Participation Level:  Did Not Attend   Alison Cummings 07/30/2024, 11:24 AM

## 2024-07-31 ENCOUNTER — Telehealth (HOSPITAL_COMMUNITY): Payer: Self-pay | Admitting: Emergency Medicine

## 2024-07-31 ENCOUNTER — Observation Stay (HOSPITAL_COMMUNITY)
Admission: EM | Admit: 2024-07-31 | Discharge: 2024-08-02 | Disposition: A | Discharge status: Home / Self Care | Attending: Internal Medicine | Admitting: Internal Medicine

## 2024-07-31 DIAGNOSIS — Z720 Tobacco use: Secondary | ICD-10-CM

## 2024-07-31 DIAGNOSIS — Z87891 Personal history of nicotine dependence: Secondary | ICD-10-CM | POA: Insufficient documentation

## 2024-07-31 DIAGNOSIS — Z8659 Personal history of other mental and behavioral disorders: Secondary | ICD-10-CM | POA: Diagnosis not present

## 2024-07-31 DIAGNOSIS — B348 Other viral infections of unspecified site: Secondary | ICD-10-CM | POA: Insufficient documentation

## 2024-07-31 DIAGNOSIS — F251 Schizoaffective disorder, depressive type: Principal | ICD-10-CM

## 2024-07-31 DIAGNOSIS — E0500 Thyrotoxicosis with diffuse goiter without thyrotoxic crisis or storm: Principal | ICD-10-CM | POA: Diagnosis present

## 2024-07-31 DIAGNOSIS — I10 Essential (primary) hypertension: Secondary | ICD-10-CM | POA: Insufficient documentation

## 2024-07-31 DIAGNOSIS — R946 Abnormal results of thyroid function studies: Secondary | ICD-10-CM | POA: Insufficient documentation

## 2024-07-31 DIAGNOSIS — F4310 Post-traumatic stress disorder, unspecified: Secondary | ICD-10-CM

## 2024-07-31 DIAGNOSIS — Z91010 Allergy to peanuts: Secondary | ICD-10-CM | POA: Insufficient documentation

## 2024-07-31 DIAGNOSIS — E0590 Thyrotoxicosis, unspecified without thyrotoxic crisis or storm: Secondary | ICD-10-CM | POA: Diagnosis present

## 2024-07-31 DIAGNOSIS — F203 Undifferentiated schizophrenia: Secondary | ICD-10-CM

## 2024-07-31 DIAGNOSIS — Z8639 Personal history of other endocrine, nutritional and metabolic disease: Secondary | ICD-10-CM | POA: Diagnosis not present

## 2024-07-31 DIAGNOSIS — R7989 Other specified abnormal findings of blood chemistry: Principal | ICD-10-CM

## 2024-07-31 LAB — CBC WITH DIFFERENTIAL/PLATELET
Abs Immature Granulocytes: 0 K/uL (ref 0.00–0.07)
Basophils Absolute: 0 K/uL (ref 0.0–0.1)
Basophils Relative: 0 %
Eosinophils Absolute: 0.1 K/uL (ref 0.0–0.5)
Eosinophils Relative: 2 %
HCT: 41.1 % (ref 36.0–46.0)
Hemoglobin: 13.3 g/dL (ref 12.0–15.0)
Immature Granulocytes: 0 %
Lymphocytes Relative: 45 %
Lymphs Abs: 2 K/uL (ref 0.7–4.0)
MCH: 26.3 pg (ref 26.0–34.0)
MCHC: 32.4 g/dL (ref 30.0–36.0)
MCV: 81.4 fL (ref 80.0–100.0)
Monocytes Absolute: 0.7 K/uL (ref 0.1–1.0)
Monocytes Relative: 15 %
Neutro Abs: 1.7 K/uL (ref 1.7–7.7)
Neutrophils Relative %: 38 %
Platelets: 107 K/uL — ABNORMAL LOW (ref 150–400)
RBC: 5.05 MIL/uL (ref 3.87–5.11)
RDW: 13.2 % (ref 11.5–15.5)
WBC: 4.4 K/uL (ref 4.0–10.5)
nRBC: 0 % (ref 0.0–0.2)

## 2024-07-31 LAB — BASIC METABOLIC PANEL WITH GFR
Anion gap: 10 (ref 5–15)
BUN: 16 mg/dL (ref 6–20)
CO2: 24 mmol/L (ref 22–32)
Calcium: 9.5 mg/dL (ref 8.9–10.3)
Chloride: 102 mmol/L (ref 98–111)
Creatinine, Ser: 0.61 mg/dL (ref 0.44–1.00)
GFR, Estimated: >60 mL/min
Glucose, Bld: 79 mg/dL (ref 70–99)
Potassium: 4.6 mmol/L (ref 3.5–5.1)
Sodium: 135 mmol/L (ref 135–145)

## 2024-07-31 LAB — T4, FREE: Free T4: >5.5 ng/dL — ABNORMAL HIGH (ref 0.80–2.00)

## 2024-07-31 LAB — TSH: TSH: <0.1 u[IU]/mL — ABNORMAL LOW (ref 0.350–4.500)

## 2024-07-31 MED ORDER — RISPERIDONE 0.5 MG PO TABS: 0.5000 mg | ORAL_TABLET | Freq: Two times a day (BID) | ORAL | Status: DC

## 2024-07-31 MED ORDER — MENTHOL 3 MG MT LOZG: 1.0000 | LOZENGE | OROMUCOSAL | Status: AC | PRN

## 2024-07-31 MED ORDER — PHENOL 1.4 % MT LIQD: 1.0000 | OROMUCOSAL | Status: AC | PRN

## 2024-07-31 MED ORDER — PREDNISONE 5 MG PO TABS: 10.0000 mg | ORAL_TABLET | Freq: Every day | ORAL | Status: DC

## 2024-07-31 MED ORDER — FLUOXETINE HCL 20 MG PO CAPS: 20.0000 mg | ORAL_CAPSULE | Freq: Every day | ORAL | Status: DC

## 2024-07-31 MED ORDER — GUAIFENESIN ER 600 MG PO TB12: 600.0000 mg | ORAL_TABLET | Freq: Two times a day (BID) | ORAL | Status: AC

## 2024-07-31 MED ORDER — NICOTINE POLACRILEX 2 MG MT GUM: 2.0000 mg | CHEWING_GUM | OROMUCOSAL | Status: AC | PRN

## 2024-07-31 MED ORDER — PROPRANOLOL HCL 20 MG PO TABS: 10.0000 mg | ORAL_TABLET | Freq: Three times a day (TID) | ORAL | Status: DC

## 2024-07-31 MED ORDER — PROPRANOLOL HCL 10 MG PO TABS: 10.0000 mg | ORAL_TABLET | Freq: Three times a day (TID) | ORAL | Status: DC

## 2024-07-31 NOTE — Progress Notes (Signed)
 Pt D/C to [facility] as ordered. Picked up by EMS. D/C instructions reviewed with pt. All belongings given to pt from assigned locker and sheet signed in agreement. Vitals done, WNL, pt afebrile. PRN Vistaril  given with desired effect when reassessed at 1020. Safety checks maintained without incident till time of departure.

## 2024-07-31 NOTE — ED Triage Notes (Signed)
 Patient from [facility] EMS says patient was voluntary at [facility] depression Positive rhinovirus EMS says [facility] sent patient to [facility] because patient has h/o thyrotoxicosis and they are afraid it will affect patient  Patient stable in triage

## 2024-07-31 NOTE — ED Provider Notes (Signed)
 " [place] EMERGENCY DEPARTMENT AT [facility] Provider Note   CSN: [id number] Arrival date & time: 07/31/24  1302     Patient presents with: No chief complaint on file.   ESBEYDI MANAGO is a 23 y.o. female with a past medical history significant for ADHD, hypothyroidism, depression, PTSD, schizoaffective disorder who presents to the ED due to concerns about thyrotoxicosis.  She was recently admitted to [facility] for the same where her methimazole  medication was decreased? She notes she has been compliant with her medications. Admits to a sore throat. Tested positive for rhinovirus and adenovirus at [facility]. Was admitted at [facility] for hallucinations.   History obtained from patient and past medical records. No interpreter used during encounter.       Prior to Admission medications  Medication Sig Start Date End Date Taking? Authorizing Provider  FLUoxetine  (PROZAC ) 20 MG capsule Take 1 capsule (20 mg total) by mouth daily. 08/01/24   Carrion-Carrero, Marlo, MD  guaiFENesin  (MUCINEX ) 600 MG 12 hr tablet Take 1 tablet (600 mg total) by mouth 2 (two) times daily. 07/31/24   Carrion-Carrero, Marlo, MD  hydrOXYzine  (ATARAX ) 25 MG tablet Take 25 mg by mouth 2 (two) times daily as needed for anxiety or itching.    [provider]  menthol  (CEPACOL) 3 MG lozenge Take 1 lozenge (3 mg total) by mouth as needed for sore throat. 07/31/24   Carrion-Carrero, Marlo, MD  methimazole  (TAPAZOLE ) 10 MG tablet Take 2 tablets (20 mg total) by mouth 2 (two) times daily. 01/20/24   Gonfa, Taye T, MD  nicotine  polacrilex (NICORETTE ) 2 MG gum Take 1 each (2 mg total) by mouth as needed for smoking cessation. 07/31/24   Carrion-Carrero, Marlo, MD  phenol (CHLORASEPTIC) 1.4 % LIQD Use as directed 1 spray in the mouth or throat as needed for throat irritation / pain. 07/31/24   Carrion-Carrero, Marlo, MD  propranolol  (INDERAL ) 10 MG tablet Take 1 tablet (10 mg total) by mouth 3 (three) times  daily. 07/31/24   Carrion-Carrero, Marlo, MD  risperiDONE  (RISPERDAL ) 0.5 MG tablet Take 1 tablet (0.5 mg total) by mouth 2 (two) times daily. 07/31/24   Carrion-Carrero, Marlo, MD    Allergies: Apple, Cherry, Mangifera indica, Nutritional supplements, Peanut (diagnostic), and Ibuprofen     Review of Systems  Respiratory:  Negative for shortness of breath.   Cardiovascular:  Negative for chest pain and palpitations.  Gastrointestinal:  Negative for abdominal pain.    Updated Vital Signs BP 123/69 (BP Location: Right Arm)   Pulse 99   Temp 98.7 F (37.1 C) (Oral)   Resp 18   Ht 5' 2 (1.575 m)   Wt 99.8 kg   LMP 07/09/2024   SpO2 100%   BMI 40.24 kg/m   Physical Exam Vitals and nursing note reviewed.  Constitutional:      General: She is not in acute distress.    Appearance: She is not ill-appearing.  HENT:     Head: Normocephalic.  Eyes:     Pupils: Pupils are equal, round, and reactive to light.  Cardiovascular:     Rate and Rhythm: Normal rate and regular rhythm.     Pulses: Normal pulses.     Heart sounds: Normal heart sounds. No murmur heard.    No friction rub. No gallop.  Pulmonary:     Effort: Pulmonary effort is normal.     Breath sounds: Normal breath sounds.  Abdominal:     General: Abdomen is flat. There is no  distension.     Palpations: Abdomen is soft.     Tenderness: There is no abdominal tenderness. There is no guarding or rebound.  Musculoskeletal:        General: Normal range of motion.     Cervical back: Neck supple.  Skin:    General: Skin is warm and dry.  Neurological:     General: No focal deficit present.     Mental Status: She is alert.  Psychiatric:        Mood and Affect: Mood normal.        Behavior: Behavior normal.     (all labs ordered are listed, but only abnormal results are displayed) Labs Reviewed  CBC WITH DIFFERENTIAL/PLATELET - Abnormal; Notable for the following components:      Result Value   Platelets 107 (*)     All other components within normal limits  TSH - Abnormal; Notable for the following components:   TSH <0.100 (*)    All other components within normal limits  T4, FREE - Abnormal; Notable for the following components:   Free T4 >5.50 (*)    All other components within normal limits  BASIC METABOLIC PANEL WITH GFR  T3, FREE    EKG: None  Radiology: No results found.   Procedures   Medications Ordered in the ED - No data to display                                  Medical Decision Making Amount and/or Complexity of Data Reviewed External Data Reviewed: notes.    Details: [facility] notes Labs: ordered. Decision-making details documented in ED Course.  Risk Decision regarding hospitalization.   This patient presents to the ED for concern of thyroid  disease, this involves an extensive number of treatment options, and is a complaint that carries with it a high risk of complications and morbidity.  The differential diagnosis includes thyroid  storm, infection, dehydration, electrolyte abnormalities, etc  23 year old female presents from [facility] due to concerns about thyroid  storm.  Patient has a history of hyperthyroidism.  Has been compliant with her medication.  At [facility] her TSH was undetectable and had an elevated free T4.  Patient was sent to the ED for admission.  Reviewed labs.  TSH on 12/25 was <0.1 and free T4 was 5.13.  Upon arrival, stable vitals.  Patient well-appearing on exam.  Currently admitted at [facility] for hallucinations.  Reassuring physical exam.  Repeat labs ordered.  TSH free T4/T3. Patient notes she has been compliant with her thyroid  medication; however per [facility] note, patient has been off her medications for 1 week.   CBC with no leukocytosis.  Thrombocytopenia 107.  Normal hemoglobin.  BMP unremarkable.  Normal renal function.  No major electrolyte derangements. TSH <0.1 and Free T4 >5.5.  Will discuss with hospitalist for admission.  Discussed with Dr. Sundil with [facility]  who will see patient. She requests consult with endocrinology for advice for treatment plan vs. Transfer for care.   8:21 PM Discussed with [facility] endocrinologist who notes he is unable to give any medical advice over the phone. Will discuss with [facility] for possible transfer vs. Endocrinology consult.   Patient handed off to Tinnie Matter, PA-C at shift change pending discussion with endocrinology.   Co morbidities that complicate the patient evaluation  hyperthyroidism   Social Determinants of Health:  Has PCP  Test / Admission - Considered:  Disposition  pending at shift change. Patient will require admission either here or at a facility with endocrinology.      Final diagnoses:  Low TSH level    ED Discharge Orders     None          Lorelle Aleck JAYSON DEVONNA 07/31/24 2102    Franklyn Sid SAILOR, MD 07/31/24 2120  "
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
## 2024-07-31 NOTE — Progress Notes (Signed)
 Patient ID: BRECKLYNN JIAN, female   DOB: Dec 05, 2000, 23 y.o.   MRN: [id number] called and spoke to non emergent EMS for transport for this pt to transport to [facility] for medical treatment per MD . Janese Kirsh ED CN Patty

## 2024-07-31 NOTE — ED Notes (Signed)
 Called [facility] consult line to request on call endocrinology for consult with PA- Aleck

## 2024-07-31 NOTE — Progress Notes (Signed)
(  Sleep Hours) -8.5 (Any PRNs that were needed, meds refused, or side effects to meds)- tylenol , and Atarx (Any disturbances and when (visitation, over night)-none (Concerns raised by the patient)- none (SI/HI/AVH)-denied

## 2024-07-31 NOTE — H&P (Signed)
 " History and Physical    Alison Cummings FMW:[id number] DOB: 2001-07-03 DOA: 07/31/2024  PCP: Paseda, Folashade R, FNP   Patient coming from: Home   Chief Complaint: No chief complaint on file.  ED TRIAGE note:  Patient from [facility] says patient was voluntary at [facility] depression Positive rhinovirus EMS says [facility] sent patient to [facility] because patient has h/o thyrotoxicosis and they are afraid it will affect patient  Patient stable in triage       HPI:  Alison Cummings is a 23 y.o. female with medical history significant of PTSD, depression, schizophrenia and history of Graves' disease presents emergency department from behavioral health in the setting of rhinovirus infection and concern for thyrotoxicosis in the setting of elevated T4 and low TSH level.  Patient is voluntarily committed to behavioral health for the treatment for depression and hallucination. While in behavioral health patient found to have rhinovirus positive positive.  Currently patient complaining about bilateral hand tremor and lightheadedness.  Denies any chest pain, palpitation, shortness of breath, nausea, vomiting and constipation.  Per chart review patient recently admitted to [facility] for psychosis and thyrotoxicosis being treated with methimazole  and propranolol .   ED Course:  At presentation to ED patient is hemodynamically stable.  Lab work, Elevated T4 above 5, low TSH below 1 pending T3 level.  BMP unremarkable. CBC showing normal WBC count, stable H&H and normal absolute neutrophil count 1.7.  Low platelet count 107.  In the behavior health patient has respiratory panel done which showed rhinovirus and adenovirus positive.  Patient is currently hemodynamically stable not in thyrotoxicosis shock even though patient has elevated  T4 and low TSH level which is around the same range as compared to 6 months ago.  Initially deferred admission given patient has active viral infection which  will place patient at risk for development of agranulocytosis in the continuation of treatment in the with methimazole .  ED physician reached out to [facility] health, [facility] could declined patient to transfer.  After already spoke with [facility] and per [facility] medicine low suspicion for thyroid  storm however  will be available to transfer in future if requires however currently declined politely.  Endocrinologist of none of this facility are agreeable to speak over phone and give recommendation without seeing the patient.  Hospitalist consulted for further management of thyrotoxicosis in the setting of Graves' disease and management for rhinovirus infection.   Significant labs in the ED: Lab Orders         CBC with Differential         Basic metabolic panel         TSH         T4, free         T3, free         CBC         CBC         Comprehensive metabolic panel       Review of Systems:  Review of Systems  Constitutional:  Negative for chills, fever, malaise/fatigue and weight loss.  Eyes:  Negative for blurred vision and double vision.  Respiratory:  Negative for cough and shortness of breath.   Cardiovascular:  Negative for chest pain and palpitations.  Gastrointestinal:  Negative for heartburn, nausea and vomiting.  Neurological:  Positive for tremors. Negative for dizziness and headaches.  Psychiatric/Behavioral:  Negative for depression, hallucinations, substance abuse and suicidal ideas. The patient is not nervous/anxious and does not have insomnia.  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 Her smoking use included cigarettes. She has never used smokeless tobacco. She reports that she does not currently use drugs after having used the following drugs: Marijuana. She reports that she does not drink alcohol.  Allergies[1]  Family History  Problem Relation Age of Onset   Schizophrenia Mother    High blood pressure Mother    Hyperlipidemia Mother    High blood pressure Father    ADD / ADHD Father    Bipolar disorder Father    Stroke Father    Diabetes Sister    Hyperlipidemia Sister     Prior to Admission medications  Medication Sig Start Date End Date Taking? Authorizing Provider  FLUoxetine  (PROZAC ) 20 MG capsule Take 1 capsule (20 mg total) by mouth daily. 08/01/24   Carrion-Carrero, Marlo, MD  guaiFENesin  (MUCINEX ) 600 MG 12 hr tablet Take 1 tablet (600 mg total) by mouth 2 (two) times daily. 07/31/24   Carrion-Carrero, Marlo, MD  hydrOXYzine  (ATARAX ) 25 MG tablet Take 25 mg by mouth 2 (two) times daily as needed for anxiety or itching.    [provider]  menthol  (CEPACOL) 3 MG lozenge Take 1 lozenge (3 mg total) by mouth as needed for sore throat. 07/31/24   Carrion-Carrero, Marlo, MD  methimazole  (TAPAZOLE ) 10 MG tablet Take 2 tablets (20 mg total) by mouth 2 (two) times daily. 01/20/24   Gonfa, Taye T, MD  nicotine  polacrilex (NICORETTE ) 2 MG gum Take 1 each (2 mg total) by mouth as needed for smoking cessation. 07/31/24   Carrion-Carrero, Marlo, MD  phenol (CHLORASEPTIC) 1.4 % LIQD Use as directed 1 spray in the mouth or throat as needed for throat irritation / pain. 07/31/24   Carrion-Carrero, Marlo, MD  propranolol  (INDERAL ) 10 MG tablet Take 1 tablet (10 mg total) by mouth 3 (three) times daily. 07/31/24   Carrion-Carrero, Marlo, MD  risperiDONE  (RISPERDAL ) 0.5 MG tablet Take 1  tablet (0.5 mg total) by mouth 2 (two) times daily. 07/31/24   Homer Marlo, MD     Physical Exam: Vitals:   07/31/24 2215 08/01/24 0200 08/01/24 0201 08/01/24 0622  BP:  102/89    Pulse:  (!) 111    Resp: 18 18 18    Temp:   98.5 F (36.9 C) 98 F (36.7 C)  TempSrc:      SpO2:  99%    Weight:      Height:        Physical Exam Vitals and nursing note reviewed.  HENT:     Nose: Nose normal.     Mouth/Throat:     Mouth: Mucous membranes are moist.  Eyes:     Pupils: Pupils are equal, round, and reactive to light.  Cardiovascular:     Rate and Rhythm: Regular rhythm. Tachycardia present.     Pulses: Normal pulses.  Heart sounds: Normal heart sounds.  Pulmonary:     Effort: Pulmonary effort is normal.     Breath sounds: Normal breath sounds.  Abdominal:     Palpations: Abdomen is soft.  Musculoskeletal:     Cervical back: Neck supple.  Skin:    Capillary Refill: Capillary refill takes less than 2 seconds.  Neurological:     Mental Status: She is alert and oriented to person, place, and time.  Psychiatric:        Attention and Perception: She is inattentive.        Mood and Affect: Mood is anxious.        Speech: Speech normal.        Behavior: Behavior is uncooperative.        Thought Content: Thought content normal.        Cognition and Memory: Cognition and memory normal.        Judgment: Judgment is impulsive and inappropriate.      Labs on Admission: I have personally reviewed following labs and imaging studies  CBC: Recent Labs  Lab 07/29/24 0011 07/31/24 1652  WBC 6.0 4.4  NEUTROABS  --  1.7  HGB 12.3 13.3  HCT 37.9 41.1  MCV 82.8 81.4  PLT 140* 107*   Basic Metabolic Panel: Recent Labs  Lab 07/29/24 0011 07/31/24 1652  NA 137 135  K 4.3 4.6  CL 104 102  CO2 22 24  GLUCOSE 90 79  BUN 22* 16  CREATININE 0.70 0.61  CALCIUM 8.8* 9.5   GFR: Estimated Creatinine Clearance: 121.9 mL/min (by C-G formula based on SCr of 0.61  mg/dL). Liver Function Tests: Recent Labs  Lab 07/29/24 0011  AST 31  ALT 39  ALKPHOS 83  BILITOT 0.4  PROT 7.4  ALBUMIN 3.9   No results for input(s): LIPASE, AMYLASE in the last 168 hours. No results for input(s): AMMONIA in the last 168 hours. Coagulation Profile: No results for input(s): INR, PROTIME in the last 168 hours. Cardiac Enzymes: No results for input(s): CKTOTAL, CKMB, CKMBINDEX, TROPONINI, TROPONINIHS in the last 168 hours. BNP (last 3 results) No results for input(s): BNP in the last 8760 hours. HbA1C: No results for input(s): HGBA1C in the last 72 hours. CBG: No results for input(s): GLUCAP in the last 168 hours. Lipid Profile: No results for input(s): CHOL, HDL, LDLCALC, TRIG, CHOLHDL, LDLDIRECT in the last 72 hours. Thyroid  Function Tests: Recent Labs    07/31/24 1652  TSH <0.100*  FREET4 >5.50*   Anemia Panel: No results for input(s): VITAMINB12, FOLATE, FERRITIN, TIBC, IRON, RETICCTPCT in the last 72 hours. Urine analysis:    Component Value Date/Time   COLORURINE YELLOW 01/18/2024 2330   APPEARANCEUR CLEAR 01/18/2024 2330   LABSPEC 1.027 01/18/2024 2330   PHURINE 5.0 01/18/2024 2330   GLUCOSEU NEGATIVE 01/18/2024 2330   HGBUR NEGATIVE 01/18/2024 2330   BILIRUBINUR NEGATIVE 01/18/2024 2330   KETONESUR NEGATIVE 01/18/2024 2330   PROTEINUR NEGATIVE 01/18/2024 2330   UROBILINOGEN 0.2 05/19/2021 1417   NITRITE NEGATIVE 01/18/2024 2330   LEUKOCYTESUR NEGATIVE 01/18/2024 2330    Radiological Exams on Admission: I have personally reviewed images No results found.   EKG: My personal interpretation of EKG shows: Sinus tachycardia heart rate 107 with premature ventricular complex.    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  At presentation to ED patient found tachycardic otherwise hemodynamically stable.  Physical exam revealed bilateral upper extremity tremor. -Lab work, Elevated T4 above 5, low TSH below 1 pending T3 level. BMP unremarkable. CBC showing normal WBC count, stable H&H and normal absolute neutrophil count 1.7.  Low platelet count 107. In the behavior health patient has respiratory panel done which showed rhinovirus and adenovirus positive. -Patient reported taking methimazole  10 mg daily and propranolol  3 times daily. -In the setting of thyrotoxic crisis and as there is no endocrinology coverage in our facility initially requested ED physician to transfer to tertiary center with endocrine support however unfortunately [facility], [facility] and [facility] at capacity declined patient.  Endocrinologist declined to give any recommendation without seeing the patient as well. Assessment and plan -Continue methimazole  10 mg twice daily. -Continue propranolol  10 mg 3 times daily - Starting IV Decadron  4 mg daily.  However patient declining IV access changing IV Decadron  to oral prednisone . -Given patient has active viral infection continue to monitor CBC as currently initiating methimazole  10 mg twice daily. Addendum - Patient declining to take any kind of medications and declined any lab work.  Underlying psychiatric condition is a barrier for treatment and medication management.  Consulting inpatient psychiatry to evaluate patient as well.  Rhinovirus infection -Continue conservative management  History of depression History of anxiety History of PTSD History of schizophrenia -Continue Atarax , Prozac  and risperidone     DVT prophylaxis:  Lovenox  Code Status:  Full Code Diet: Regular  diet Disposition Plan: Continue to monitor CBC to monitor development of low neutrophil count. Consults: Inpatient psychiatry Admission status:   Observation, progressive unit  Severity of Illness: The appropriate patient status for this patient is OBSERVATION. Observation status is judged to be reasonable and necessary in order to provide the required intensity of service to ensure the patient's safety. The patient's presenting symptoms, physical exam findings, and initial radiographic and laboratory data in the context of their medical condition is felt to place them at decreased risk for further clinical deterioration. Furthermore, it is anticipated that the patient will be medically stable for discharge from the hospital within 2 midnights of admission.     Dillian Feig, MD [facility]  How to contact the [facility] Attending or Consulting provider 7A - 7P or covering provider during after hours 7P -7A, for this patient.  Check the care team in [facility] and look for a) attending/consulting TR[facility] provider listed and b) the TRH team listed Log into [email or web address] and use [facility]'s universal password to access. If you do not have the password, please contact the hospital operator. Locate the TRH provider you are looking for under [facility] and page to a number that you can be directly reached. If you still have difficulty reaching the provider, please page the [facility] (Director on Call) for the Hospitalists listed on amion for assistance.  08/01/2024, 6:48 AM           [1]  Allergies Allergen Reactions   Apple Itching, Swelling and Other (See Comments)    Tongue swells and throat itches   Cherry Itching, Swelling and Other (See Comments)    Tongue swells and throat itches   Mangifera Indica Itching, Swelling and Other (See Comments)    MANGO allergy;   Nutritional Supplements Swelling   Peanut (Diagnostic)  Itching and Swelling   Ibuprofen  Swelling and Palpitations    Of  feet   "

## 2024-07-31 NOTE — ED Notes (Signed)
 Sandwich and soda given per request

## 2024-07-31 NOTE — ED Provider Notes (Signed)
 Received patient in signout from previous provider pending [facility] consult.  See their note.  In short, patient presents Emergency Department for evaluation of concern for thyroid  crisis from [facility]. Complained of sore throat and tested positive for rhinovirus and adenovirus at [facility]. Has a history of hyperthyroidism but takes methimazole  but this was recently decreased.  She endorses that she is compliant with her medications.  Was voluntarily admitted to [facility] for hallucinations  ED workup notable for TSH less than 0.100.  T4 greater than 5.50 EKG sinus tachycardia 109 bpm with no ischemic changes  Patient was accepted for admission by [facility] hospitalist Dr. Sundil however recommended reaching out to endocrinology for how to manage patient.  Previous ED PA reached out to [facility] endocrinology who reported that they do not give information via telephone. I attempted to reach out to [facility]ocrinology who also would not provide recommendation via telephone. I attempted to call [facility] for ED to hospitalist transfer and transfer line informed me that the hospitalist refused admission as they have no beds. They recommended to call back tomorrow morning  Dr. Sundil is currently managing patient overnight until patient can be transferred.  Consulted [facility] for transfer. Spoke to transfer service who will consult provider and call back. Pending at sign out.   Transfer of care to [facility] PA   Minnie Tinnie BRAVO, PA 08/01/24 STANLY Patsey Lot, MD 08/01/24 1438

## 2024-07-31 NOTE — ED Notes (Signed)
 At 2212 2nd call to [facility] consult line.
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
## 2024-07-31 NOTE — Plan of Care (Addendum)
 Alison Cummings is a 23 y.o. female with medical history significant of PTSD, depression, schizophrenia and history of Graves' disease (currently on methimazole  10 mg daily, propanol 20 mg twice daily).  Who has been admitted to behavioral health referred to emergency department for evaluation for positive rhinovirus infection and per behavioral health given patient has previous history of thyrotoxicosis it might affect the patient. Patient was  voluntarily committed to behavioral health for the management for depression.  Patient recently admitted to [facility] health for psychosis and thyrotoxicosis being treated with methimazole  and propranolol .  Per chart review of the endocrine consult note: Recommendations: - Decrease Methimazole  from 10 mg BID to 10 mg daily. - Would decrease Propanolol from 20 mg TID to BID and continue to adjust pending HR . - Patient was counseled on holding off on methimazole  with any fever or infection (as would need to rule out agranulocytosis) - If the patient has an infection/fever, please hold methimazole  and obtain a CBC in order to evaluate if she has agranulocytosis (rare, but serious side effect of methimazole )  ED Course:  At presentation to ED patient is hemodynamically stable.  Lab work, Elevated T above 5, low TSH below 1 pending T3 level.  BMP unremarkable. CBC showing normal WBC count, stable H&H and normal absolute neutrophil count 1.7.  Low platelet count 107.  In the behavior health patient has respiratory panel done which showed rhinovirus and adenovirus positive.  Patient is currently hemodynamically stable not in thyrotoxicosis shock even though patient has elevated  T4 and low TSH level which is around the same range as compared to 6 months ago.  Behavior has reported that they has not giving patient methimazole  for last 1 week however patient is stating that she has been continue to take methimazole .  At bedside evaluation patient has bilateral  upper extremity shakiness as well as lab work showed concern for thyrotoxic crisis.  However it is not safe to not safe to initiate methimazole  in the setting of active viral infection as it can provoke agranulocytosis which is a life-threatening condition.  Discussed situation with ED physician and requested to transfer patient to tertiary center with endocrine support. -Recommending to hold off initiating methimazole  as it can provoke  agranulocytosis in the context of active viral infection. - Recommending to start oral prednisone  10 mg daily and continue propranolol  10 mg 3 times daily.   Damira Kem, MD [facility] 07/31/2024, 7:36 PM

## 2024-07-31 NOTE — Plan of Care (Signed)
  Problem: Education: Goal: Mental status will improve Outcome: Progressing   

## 2024-07-31 NOTE — Telephone Encounter (Signed)
 Received a call from psychiatry resident at [facility], who states that this patient was recently admitted to [facility] from the emergency department within the [facility], and that the patient has a history of thyrotoxicosis, and has an undetectably low TSH.  Reports that they consulted Dr. Celinda with hospital medicine who recommended that the patient be admitted.  Psychiatry resident requests guidance on how they should proceed to get the patient admitted.  Stated that if they would like to send the patient over to the ED by EMS we can evaluate.

## 2024-07-31 NOTE — Progress Notes (Signed)
 Pt remains on droplet precautions. Pt vitals found to be WNL. Pt has had complaints of throat pain. On call provider notified and orders throat lozenges and Chloraseptic spray. Pt educated on the importance of hydration. Safety checks continue q15.

## 2024-07-31 NOTE — ED Notes (Signed)
 At 2230 called [facility] and spoke with Amy to initiate transfer for patient acceptance. Facesheet faxed to [phone]. Requesting provider Tinnie Matter PA-C.

## 2024-08-01 ENCOUNTER — Other Ambulatory Visit: Payer: Self-pay

## 2024-08-01 ENCOUNTER — Encounter (HOSPITAL_COMMUNITY): Payer: Self-pay | Admitting: Internal Medicine

## 2024-08-01 DIAGNOSIS — Z8659 Personal history of other mental and behavioral disorders: Secondary | ICD-10-CM

## 2024-08-01 DIAGNOSIS — Z8639 Personal history of other endocrine, nutritional and metabolic disease: Secondary | ICD-10-CM

## 2024-08-01 DIAGNOSIS — E0500 Thyrotoxicosis with diffuse goiter without thyrotoxic crisis or storm: Secondary | ICD-10-CM | POA: Diagnosis not present

## 2024-08-01 DIAGNOSIS — F251 Schizoaffective disorder, depressive type: Secondary | ICD-10-CM | POA: Diagnosis not present

## 2024-08-01 DIAGNOSIS — B348 Other viral infections of unspecified site: Secondary | ICD-10-CM | POA: Insufficient documentation

## 2024-08-01 LAB — COMPREHENSIVE METABOLIC PANEL WITH GFR
ALT: 55 U/L — ABNORMAL HIGH (ref 0–44)
AST: 29 U/L (ref 15–41)
Albumin: 3.9 g/dL (ref 3.5–5.0)
Alkaline Phosphatase: 87 U/L (ref 38–126)
Anion gap: 10 (ref 5–15)
BUN: 19 mg/dL (ref 6–20)
CO2: 24 mmol/L (ref 22–32)
Calcium: 9.5 mg/dL (ref 8.9–10.3)
Chloride: 99 mmol/L (ref 98–111)
Creatinine, Ser: 0.89 mg/dL (ref 0.44–1.00)
GFR, Estimated: >60 mL/min
Glucose, Bld: 150 mg/dL — ABNORMAL HIGH (ref 70–99)
Potassium: 5.1 mmol/L (ref 3.5–5.1)
Sodium: 134 mmol/L — ABNORMAL LOW (ref 135–145)
Total Bilirubin: 0.4 mg/dL (ref 0.0–1.2)
Total Protein: 7.6 g/dL (ref 6.5–8.1)

## 2024-08-01 LAB — CBC
HCT: 37.4 % (ref 36.0–46.0)
Hemoglobin: 12.6 g/dL (ref 12.0–15.0)
MCH: 26.9 pg (ref 26.0–34.0)
MCHC: 33.7 g/dL (ref 30.0–36.0)
MCV: 79.9 fL — ABNORMAL LOW (ref 80.0–100.0)
Platelets: 139 K/uL — ABNORMAL LOW (ref 150–400)
RBC: 4.68 MIL/uL (ref 3.87–5.11)
RDW: 12.9 % (ref 11.5–15.5)
WBC: 2.5 K/uL — ABNORMAL LOW (ref 4.0–10.5)
nRBC: 0 % (ref 0.0–0.2)

## 2024-08-01 LAB — T3, FREE
T3, Free: 15.8 pg/mL — ABNORMAL HIGH (ref 2.0–4.4)
T3, Free: 18.8 pg/mL — ABNORMAL HIGH (ref 2.0–4.4)

## 2024-08-01 LAB — GLUCOSE, CAPILLARY: Glucose-Capillary: 162 mg/dL — ABNORMAL HIGH (ref 70–99)

## 2024-08-01 MED ORDER — GUAIFENESIN ER 600 MG PO TB12
600.0000 mg | ORAL_TABLET | Freq: Two times a day (BID) | ORAL | Status: DC
  Administered 2024-08-01 – 2024-08-02 (×4): 600 mg via ORAL
  Filled 2024-08-01 (×4): qty 1

## 2024-08-01 MED ORDER — PROPRANOLOL HCL 10 MG PO TABS
10.0000 mg | ORAL_TABLET | Freq: Three times a day (TID) | ORAL | Status: DC
  Administered 2024-08-01 – 2024-08-02 (×6): 10 mg via ORAL
  Filled 2024-08-01 (×6): qty 1

## 2024-08-01 MED ORDER — HYDROXYZINE HCL 25 MG PO TABS
25.0000 mg | ORAL_TABLET | Freq: Two times a day (BID) | ORAL | Status: DC | PRN
  Administered 2024-08-01 (×2): 25 mg via ORAL
  Filled 2024-08-01 (×2): qty 1

## 2024-08-01 MED ORDER — DEXAMETHASONE SODIUM PHOSPHATE 4 MG/ML IJ SOLN
4.0000 mg | INTRAMUSCULAR | Status: DC
  Filled 2024-08-01: qty 1

## 2024-08-01 MED ORDER — MENTHOL 3 MG MT LOZG
1.0000 | LOZENGE | OROMUCOSAL | Status: DC | PRN
  Administered 2024-08-01: 3 mg via ORAL
  Filled 2024-08-01: qty 9

## 2024-08-01 MED ORDER — ENOXAPARIN SODIUM 40 MG/0.4ML IJ SOSY
40.0000 mg | PREFILLED_SYRINGE | INTRAMUSCULAR | Status: DC
  Filled 2024-08-01: qty 0.4

## 2024-08-01 MED ORDER — SODIUM CHLORIDE 0.9% FLUSH: 3.0000 mL | INTRAVENOUS | Status: DC | PRN

## 2024-08-01 MED ORDER — ORAL CARE MOUTH RINSE: 15.0000 mL | OROMUCOSAL | Status: DC | PRN

## 2024-08-01 MED ORDER — ONDANSETRON HCL 4 MG/2ML IJ SOLN: 4.0000 mg | Freq: Four times a day (QID) | INTRAMUSCULAR | Status: DC | PRN

## 2024-08-01 MED ORDER — ONDANSETRON HCL 4 MG PO TABS: 4.0000 mg | ORAL_TABLET | Freq: Four times a day (QID) | ORAL | Status: DC | PRN

## 2024-08-01 MED ORDER — METHIMAZOLE 10 MG PO TABS: 20.0000 mg | ORAL_TABLET | Freq: Two times a day (BID) | ORAL | Status: DC

## 2024-08-01 MED ORDER — PREDNISONE 20 MG PO TABS
40.0000 mg | ORAL_TABLET | Freq: Every day | ORAL | Status: DC
  Administered 2024-08-01 – 2024-08-02 (×2): 40 mg via ORAL
  Filled 2024-08-01 (×2): qty 2

## 2024-08-01 MED ORDER — OLANZAPINE 5 MG PO TABS: 10.0000 mg | ORAL_TABLET | Freq: Two times a day (BID) | ORAL | Status: DC | PRN

## 2024-08-01 MED ORDER — METHIMAZOLE 10 MG PO TABS
10.0000 mg | ORAL_TABLET | Freq: Two times a day (BID) | ORAL | Status: DC
  Administered 2024-08-01 – 2024-08-02 (×4): 10 mg via ORAL
  Filled 2024-08-01 (×4): qty 1

## 2024-08-01 MED ORDER — PHENOL 1.4 % MT LIQD: 1.0000 | OROMUCOSAL | Status: DC | PRN

## 2024-08-01 MED ORDER — SODIUM CHLORIDE 0.9% FLUSH: 3.0000 mL | Freq: Two times a day (BID) | INTRAVENOUS | Status: DC

## 2024-08-01 MED ORDER — ACETAMINOPHEN 325 MG PO TABS: 650.0000 mg | ORAL_TABLET | Freq: Four times a day (QID) | ORAL | Status: DC | PRN

## 2024-08-01 MED ORDER — SODIUM CHLORIDE 0.9 % IV SOLN: 250.0000 mL | INTRAVENOUS | Status: DC | PRN

## 2024-08-01 MED ORDER — PANTOPRAZOLE SODIUM 40 MG PO TBEC
40.0000 mg | DELAYED_RELEASE_TABLET | Freq: Every day | ORAL | Status: DC
  Administered 2024-08-01 – 2024-08-02 (×2): 40 mg via ORAL
  Filled 2024-08-01 (×2): qty 1

## 2024-08-01 MED ORDER — PREDNISONE 20 MG PO TABS: 40.0000 mg | ORAL_TABLET | Freq: Every day | ORAL | Status: DC

## 2024-08-01 MED ORDER — ACETAMINOPHEN 650 MG RE SUPP: 650.0000 mg | Freq: Four times a day (QID) | RECTAL | Status: DC | PRN

## 2024-08-01 MED ORDER — RISPERIDONE 0.5 MG PO TABS
0.5000 mg | ORAL_TABLET | Freq: Two times a day (BID) | ORAL | Status: DC
  Administered 2024-08-01 – 2024-08-02 (×4): 0.5 mg via ORAL
  Filled 2024-08-01 (×4): qty 1

## 2024-08-01 MED ORDER — OLANZAPINE 10 MG IM SOLR: 10.0000 mg | Freq: Two times a day (BID) | INTRAMUSCULAR | Status: DC | PRN

## 2024-08-01 MED ORDER — FLUOXETINE HCL 20 MG PO CAPS
20.0000 mg | ORAL_CAPSULE | Freq: Every day | ORAL | Status: DC
  Administered 2024-08-01 – 2024-08-02 (×2): 20 mg via ORAL
  Filled 2024-08-01 (×2): qty 1

## 2024-08-01 NOTE — Progress Notes (Signed)
 Discussed with Psych NP, pt does not need a sitter at this time and is in a voluntary admission per NP
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
## 2024-08-01 NOTE — Progress Notes (Signed)
 " PROGRESS NOTE  Alison Cummings  FMW:[id number] DOB: Mar 13, 2001 DOA: 07/31/2024 PCP: Paseda, Folashade R, FNP   Brief Narrative: Alison Cummings is a 23 y.o. female with medical history significant of PTSD, depression, schizophrenia and history of Graves' disease presents emergency department from behavioral health in the setting of rhinovirus infection and concern for thyrotoxicosis in the setting of elevated T4 and low TSH level.  Patient is voluntarily committed to behavioral health for the treatment for depression and hallucination. While in behavioral health patient found to have rhinovirus positive positive.  On prednisone , she was hemodynamically stable.  Labs showed elevated T4, T3, low TSH.  This hormone levels are in the same range about 6 months ago.  Initially tried to transfer the patient to [facility], [facility], [facility]: Declined from all of them.  Patient admitted for further management of thyrotoxicosis.  Started on methimazole , propranolol .  Psychiatry consulted.  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  At presentation to ED patient found tachycardic otherwise hemodynamically stable.  Physical exam revealed bilateral upper extremity tremor. -Lab work, Elevated T4 above 5, low TSH below 1 pending T3 level. BMP unremarkable. CBC showing normal WBC count, stable H&H and normal absolute neutrophil count 1.7.  Low platelet count 107. In the behavior health patient has respiratory panel done which showed rhinovirus and adenovirus positive. -Patient reported taking methimazole  10 mg daily and propranolol  3  times daily.  Restarted -Currently hemodynamically stable - Initially tried to be transferred to other hospitals from ED.  ED physician contacted [facility], [facility], [facility].  All declined.  -Continue methimazole  10 mg twice daily. -Continue propranolol  10 mg 3 times daily - Starting IV Decadron  4 mg daily.  However patient declining IV access changing IV Decadron  to oral prednisone . -Given patient has active viral infection continue to monitor CBC as currently initiating methimazole  10 mg twice daily. - Consulted inpatient psychiatry to evaluate patient as well.   Rhinovirus infection -Continue conservative management   History of depression History of anxiety History of PTSD History of schizophrenia -Continue Atarax , Prozac  and risperidone      Morbid obesity: BMI of 40.2       DVT prophylaxis:enoxaparin  (LOVENOX ) injection 40 mg Start: 08/01/24 1000 SCDs Start: 08/01/24 9662 Place TED hose Start: 08/01/24 9662     Code Status: Full Code  Family Communication: None at the bedside  Patient status:Inpatient  Patient is from : Inpatient psychiatry  Anticipated discharge to: Inpatient psychiatry  Estimated DC date: 1 to 2 days   Consultants: Inpatient psychiatry  Procedures:None  Antimicrobials:  Anti-infectives (From admission, onward)    None       Subjective: Patient seen and examined at the bedside today.  Hemodynamically stable.  Heart rate in the range of 90.  Afebrile.  Patient is comfortable.  Lying on bed.  Alert and oriented.  Denies any new complaints.  On room air  Objective: Vitals:   07/31/24 2215 08/01/24 0200 08/01/24 0201 08/01/24 0622  BP:  102/89    Pulse:  (!) 111    Resp: 18 18 18    Temp:   98.5 F (36.9 C) 98 F (36.7 C)  TempSrc:  SpO2:  99%    Weight:      Height:       No intake or output data in the 24 hours ending 08/01/24 0857 Filed Weights   07/31/24 1315  Weight: 99.8 kg    Examination:  General exam: Overall  comfortable, not in distress, morbidly obese HEENT: PERRL Respiratory system:  no wheezes or crackles  Cardiovascular system: S1 & S2 heard, RRR.  Gastrointestinal system: Abdomen is nondistended, soft and nontender. Central nervous system: Alert and oriented Extremities: No edema, no clubbing ,no cyanosis Skin: No rashes, no ulcers,no icterus     Data Reviewed: I have personally reviewed following labs and imaging studies  CBC: Recent Labs  Lab 07/29/24 0011 07/31/24 1652  WBC 6.0 4.4  NEUTROABS  --  1.7  HGB 12.3 13.3  HCT 37.9 41.1  MCV 82.8 81.4  PLT 140* 107*   Basic Metabolic Panel: Recent Labs  Lab 07/29/24 0011 07/31/24 1652  NA 137 135  K 4.3 4.6  CL 104 102  CO2 22 24  GLUCOSE 90 79  BUN 22* 16  CREATININE 0.70 0.61  CALCIUM 8.8* 9.5     Recent Results (from the past 240 hours)  Respiratory (~20 pathogens) panel by PCR     Status: Abnormal   Collection Time: 07/30/24 10:17 AM   Specimen: Nasopharyngeal Swab; Respiratory  Result Value Ref Range Status   Adenovirus DETECTED (A) NOT DETECTED Final   Coronavirus 229E NOT DETECTED NOT DETECTED Final    Comment: (NOTE) The Coronavirus on the Respiratory Panel, DOES NOT test for the novel  Coronavirus (2019 nCoV)    Coronavirus HKU1 NOT DETECTED NOT DETECTED Final   Coronavirus NL63 NOT DETECTED NOT DETECTED Final   Coronavirus OC43 NOT DETECTED NOT DETECTED Final   Metapneumovirus NOT DETECTED NOT DETECTED Final   Rhinovirus / Enterovirus DETECTED (A) NOT DETECTED Final   Influenza A NOT DETECTED NOT DETECTED Final   Influenza B NOT DETECTED NOT DETECTED Final   Parainfluenza Virus 1 NOT DETECTED NOT DETECTED Final   Parainfluenza Virus 2 NOT DETECTED NOT DETECTED Final   Parainfluenza Virus 3 NOT DETECTED NOT DETECTED Final   Parainfluenza Virus 4 NOT DETECTED NOT DETECTED Final   Respiratory Syncytial Virus NOT DETECTED NOT DETECTED Final   Bordetella pertussis NOT DETECTED NOT DETECTED Final    Bordetella Parapertussis NOT DETECTED NOT DETECTED Final   Chlamydophila pneumoniae NOT DETECTED NOT DETECTED Final   Mycoplasma pneumoniae NOT DETECTED NOT DETECTED Final    Comment: Performed at [facility], [address] [place], [place] [address]  SARS Coronavirus 2 by RT PCR (hospital order, performed in [facility] hospital lab) *cepheid single result test* Anterior Nasal Swab     Status: None   Collection Time: 07/30/24 10:17 AM   Specimen: Anterior Nasal Swab  Result Value Ref Range Status   SARS Coronavirus 2 by RT PCR NEGATIVE NEGATIVE Final    Comment: (NOTE) SARS-CoV-2 target nucleic acids are NOT DETECTED.  The SARS-CoV-2 RNA is generally detectable in upper and lower respiratory specimens during the acute phase of infection. The lowest concentration of SARS-CoV-2 viral copies this assay can detect is 250 copies / mL. A negative result does not preclude SARS-CoV-2 infection and should not be used as the sole basis for treatment or other patient management decisions.  A negative result may occur with improper specimen collection / handling, submission of specimen other than nasopharyngeal swab, presence of viral mutation(s) within the  areas targeted by this assay, and inadequate number of viral copies (<250 copies / mL). A negative result must be combined with clinical observations, patient history, and epidemiological information.  Fact Sheet for Patients:   roadlaptop.co.za  Fact Sheet for Healthcare Providers: http://kim-miller.com/  This test is not yet approved or  cleared by the [place]  FDA and has been authorized for detection and/or diagnosis of SARS-CoV-2 by FDA under an Emergency Use Authorization (EUA).  This EUA will remain in effect (meaning this test can be used) for the duration of the COVID-19 declaration under Section 564(b)(1) of the Act, 21 U.S.C. section 360bbb-3(b)(1), unless the  authorization is terminated or revoked sooner.  Performed at [facility], [address] [place], [place] [address]      Radiology Studies: No results found.  Scheduled Meds:  enoxaparin  (LOVENOX ) injection  40 mg Subcutaneous Q24H   FLUoxetine   20 mg Oral Daily   guaiFENesin   600 mg Oral BID   methimazole   10 mg Oral BID   pantoprazole   40 mg Oral Daily   predniSONE   40 mg Oral Q breakfast   propranolol   10 mg Oral TID   risperiDONE   0.5 mg Oral BID   sodium chloride  flush  3 mL Intravenous Q12H   sodium chloride  flush  3 mL Intravenous Q12H   Continuous Infusions:  sodium chloride        LOS: 0 days   Ivonne Mustache, MD [facility] P12/28/2025, 8:57 AM  "

## 2024-08-01 NOTE — ED Notes (Signed)
 Pt refused to stay on heart monitor. Refused vitals at this time.

## 2024-08-01 NOTE — ED Notes (Signed)
 Pt refusing blood work and medication administration admission MD notified

## 2024-08-01 NOTE — ED Provider Notes (Signed)
" °  Physical Exam  BP 104/63   Pulse (!) 106   Temp 98.8 F (37.1 C) (Oral)   Resp 17   Ht 5' 2 (1.575 m)   Wt 99.8 kg   LMP 07/09/2024   SpO2 97%   BMI 40.24 kg/m   Physical Exam  Procedures  Procedures  ED Course / MDM   Clinical Course as of 08/01/24 0330  Sun Aug 01, 2024  0329  I have personally spoken with [facility] provider Dr. Edrick, regarding the transfer requested by hospitalist Dr. Sundil.  Patient does not meet criteria for thyroid  storm, and concern for agranulocytosis with rhinovirus infection is very low.  The recommendation is to reinitiate her methimazole  and they have formally declined to accept the patient in transfer.  They remain available for inpatient inpatient transfer should the patient deteriorate after admission in our facility. [RS]  0330 Dr. Sundil, hospitalist, has accepted this patient to her service for admission.  [RS]    Clinical Course User Index [RS] Doye Montilla, Pleasant SAUNDERS, PA-C   Medical Decision Making Amount and/or Complexity of Data Reviewed Labs: ordered.  Risk Decision regarding hospitalization.   Care of this patient assumed from preceding ED provider at time of shift change. Please see her associated note for further insight into the patient's ED course. Hx of hyperthyroid, noncompliant with methimazole . Was at [facility], + for rhinovirus, they had concern for thyroid  crisis prompting transfer to [facility] ED. Patient's only symptom is lightheadedness when changing position.  CBC unremarkable, BMP unremarkable, TSH less than 0.1, free T4 greater than 5.5, EKG with sinus tachycardia with heart rate of 109 some PVCs.  Consult was placed to hospital medicine here who did not feel comfortable excepting this patient for management of acute thyrotoxicosis.  Concern for proceeding with patient's previous dose of methimazole  given acute viral illness and concern for risk of development of agranulocytosis.  Both [facility] and [facility] endocrinology were consulted but  neither would offer recommendations over the phone.  Patient was declined for transfer for admission to [facility].  Attempt to change pending callback from [facility] transfer center for possible transfer for admission.  Dr. Sundil, hospital medicine, is agreeable in this patient if [facility] declines admission.  Please see consultation with [facility] as above; patient has been accepted to hospital medicine provider in our facility.  Remains hemodynamically stable at this time with only mild persistent tachycardia.  Clinical concern for emergent underlying condition such as thyroid  storm is exceedingly low.  Lylla voiced understanding of her medical evaluation and treatment plan. Each of their questions answered to their expressed satisfaction.  This chart was dictated using voice recognition software, Dragon. Despite the best efforts of this provider to proofread and correct errors, errors may still occur which can change documentation meaning.    Bobette Pleasant SAUNDERS DEVONNA 08/01/24 0331    Griselda Norris, MD 08/01/24 [phone]  "

## 2024-08-02 ENCOUNTER — Other Ambulatory Visit (HOSPITAL_COMMUNITY): Payer: Self-pay

## 2024-08-02 ENCOUNTER — Other Ambulatory Visit: Payer: Self-pay

## 2024-08-02 DIAGNOSIS — F251 Schizoaffective disorder, depressive type: Secondary | ICD-10-CM

## 2024-08-02 DIAGNOSIS — E0500 Thyrotoxicosis with diffuse goiter without thyrotoxic crisis or storm: Secondary | ICD-10-CM | POA: Diagnosis not present

## 2024-08-02 LAB — CBC
HCT: 36.8 % (ref 36.0–46.0)
Hemoglobin: 11.8 g/dL — ABNORMAL LOW (ref 12.0–15.0)
MCH: 26.1 pg (ref 26.0–34.0)
MCHC: 32.1 g/dL (ref 30.0–36.0)
MCV: 81.4 fL (ref 80.0–100.0)
Platelets: 119 K/uL — ABNORMAL LOW (ref 150–400)
RBC: 4.52 MIL/uL (ref 3.87–5.11)
RDW: 12.9 % (ref 11.5–15.5)
WBC: 5 K/uL (ref 4.0–10.5)
nRBC: 0 % (ref 0.0–0.2)

## 2024-08-02 LAB — GLUCOSE, CAPILLARY
Glucose-Capillary: 107 mg/dL — ABNORMAL HIGH (ref 70–99)
Glucose-Capillary: 135 mg/dL — ABNORMAL HIGH (ref 70–99)
Glucose-Capillary: 139 mg/dL — ABNORMAL HIGH (ref 70–99)

## 2024-08-02 LAB — GC/CHLAMYDIA PROBE AMP (~~LOC~~) NOT AT ARMC
Chlamydia: NEGATIVE
Comment: NEGATIVE
Comment: NORMAL
Neisseria Gonorrhea: NEGATIVE

## 2024-08-02 MED ORDER — PROPRANOLOL HCL 10 MG PO TABS
10.0000 mg | ORAL_TABLET | Freq: Three times a day (TID) | ORAL | 0 refills | Status: AC
  Filled 2024-08-02 – 2024-08-03 (×3): qty 180, 60d supply, fill #0
  Filled 2024-08-03: qty 90, 30d supply, fill #0

## 2024-08-02 MED ORDER — HYDROXYZINE HCL 25 MG PO TABS
25.0000 mg | ORAL_TABLET | Freq: Two times a day (BID) | ORAL | 0 refills | Status: AC | PRN
  Filled 2024-08-02: qty 30, 15d supply, fill #0

## 2024-08-02 MED ORDER — METHIMAZOLE 10 MG PO TABS
10.0000 mg | ORAL_TABLET | Freq: Two times a day (BID) | ORAL | 0 refills | Status: AC
  Filled 2024-08-02: qty 120, 60d supply, fill #0

## 2024-08-02 MED ORDER — FLUOXETINE HCL 20 MG PO CAPS
20.0000 mg | ORAL_CAPSULE | Freq: Every day | ORAL | 3 refills | Status: AC
  Filled 2024-08-02: qty 30, 30d supply, fill #0

## 2024-08-02 MED ORDER — RISPERIDONE 0.5 MG PO TABS
0.5000 mg | ORAL_TABLET | Freq: Two times a day (BID) | ORAL | 0 refills | Status: AC
  Filled 2024-08-02: qty 60, 30d supply, fill #0

## 2024-08-02 MED ORDER — METHIMAZOLE 10 MG PO TABS: 10.0000 mg | ORAL_TABLET | Freq: Two times a day (BID) | ORAL | Status: DC

## 2024-08-02 NOTE — TOC Initial Note (Signed)
 Transition of Care [facility]) - Initial/Assessment Note    Patient Details  Name: Alison Cummings MRN: [id number] Date of Birth: 06-27-2001  [facility]) CM/SW Contact:    Tawni [name], LCSW Phone Number: 08/02/2024, 9:56 AM  Clinical Narrative:                  Pt from [facility] and is expected to return once medically stable. Pt is not under involuntary commitment. ICM to follow.   Expected Discharge Plan: Psychiatric Hospital Barriers to Discharge: Continued Medical Work up   Patient Goals and CMS Choice            Expected Discharge Plan and Services                                              Prior Living Arrangements/Services     Patient language and need for interpreter reviewed:: Yes Do you feel safe going back to the place where you live?: Yes      Need for Family Participation in Patient Care: No (Comment) Care giver support system in place?: No (comment)   Criminal Activity/Legal Involvement Pertinent to Current Situation/Hospitalization: No - Comment as needed  Activities of Daily Living   ADL Screening (condition at time of admission) Independently performs ADLs?: Yes (appropriate for developmental age) Is the patient deaf or have difficulty hearing?: No Does the patient have difficulty seeing, even when wearing glasses/contacts?: No Does the patient have difficulty concentrating, remembering, or making decisions?: No  Permission Sought/Granted                  Emotional Assessment           Psych Involvement: Yes (comment)  Admission diagnosis:  Thyrotoxicosis [E05.90] Low TSH level [R79.89] Patient Active Problem List   Diagnosis Date Noted   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 08/01/2024   Rhinovirus infection 08/01/2024   Tobacco use disorder 07/30/2024   Schizoaffective disorder, depressive type (HCC) 07/29/2024   Thyrotoxicosis 01/19/2024   Microcytic anemia 01/19/2024   Obesity (BMI 30-39.9)  01/19/2024   PTSD (post-traumatic stress disorder) 01/05/2024   Hyperthyroidism 06/11/2023   ADHD (attention deficit hyperactivity disorder), combined type 08/31/2013   PCP:  Paseda, Folashade R, FNP Pharmacy:   [facility] [phone] [address], [place] - [address] AT [address] [place] [address] Phone: [phone] Fax: [phone]  [facility] [address] [place] [address] Phone: [phone] Fax: [phone]     Social Drivers of Health (SDOH) Social History: SDOH Screenings   Food Insecurity: No Food Insecurity (08/01/2024)  Recent Concern: Food Insecurity - Food Insecurity Present (07/05/2024)   Received from [facility]  Housing: Low Risk (08/01/2024)  Transportation Needs: No Transportation Needs (08/01/2024)  Recent Concern: Transportation Needs - Unmet Transportation Needs (07/05/2024)   Received from [facility]  Utilities: Not At Risk (08/01/2024)  Alcohol Screen: Low Risk (07/29/2024)  Depression (PHQ2-9): Medium Risk (06/11/2023)  Financial Resource Strain: High Risk (07/05/2024)   Received from [facility]  Physical Activity: Not on File (11/22/2021)   Received from [facility]  Social Connections: Not on File (04/19/2023)   Received from [facility]  Stress: Not on File (11/22/2021)   Received from [facility]  Tobacco Use: Medium Risk (08/01/2024)   SDOH Interventions:  Readmission Risk Interventions     No data to display
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
## 2024-08-02 NOTE — Plan of Care (Signed)

## 2024-08-02 NOTE — Discharge Summary (Addendum)
 Physician Discharge Summary  Alison Cummings DOB: 2001-07-10 DOA: 07/31/2024  PCP: Paseda, Folashade Cummings, Alison Cummings  Admit date: 07/31/2024 Discharge date: 08/02/2024  Admitted From: Home Disposition:  Home  Discharge Condition:Stable CODE STATUS:FULL Diet recommendation: Regular   Brief/Interim Summary: Alison Cummings is a 23 y.o. female with medical history significant of PTSD, depression, schizophrenia and history of Graves' disease presents emergency department from behavioral health in the setting of rhinovirus infection and concern for thyrotoxicosis in the setting of elevated T4 and low TSH level.  Patient is voluntarily committed to behavioral health for the treatment for depression and hallucination. While in behavioral health patient found to have rhinovirus positive positive.  On prednisone , she was hemodynamically stable.  Labs showed elevated T4, T3, low TSH.  This hormone levels are in the same range about 6 months ago.  Initially tried to transfer the patient to [facility], [facility], [facility]: Declined from all of them.  Patient admitted for further management of thyrotoxicosis.  Started on methimazole , propranolol .  Psychiatry consulted.  Currently hemodynamically stable.  She does not have any signs of thyrotoxicosis.  She was noncompliant on her medications that increased her thyroid  hormone levels.  She needs to continue methimazole  and propranolol  and follow-up with her endocrinologist.  She is medically stable for discharge today.  Psychiatry cleared her for discharge home today.  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  At presentation to ED patient found tachycardic otherwise hemodynamically stable.    She does not have any signs of thyrotoxicosis.  She  was noncompliant on her medications that increased her thyroid  hormone levels.  She needs to continue methimazole  and propranolol  and follow-up with her endocrinologist.  She needs to do a thyroid  function testing 4 weeks.  She needs to follow-up with her PCP in a week and do a CBC test because methimazole  can cause agranulocytosis   Rhinovirus infection - Asymptomatic at present.  Respiratory status stable.   History of depression History of anxiety History of PTSD History of schizophrenia - On Atarax , Prozac  and risperidone . Management as per  psychiatry.     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  Continue your medications for Graves disease.  Do a thyroid  function test in 4 weeks 3)Follow up with  psychiatry 4)Follow up with your PCP in a week.  Do a CBC test during the follow-up   Increase activity slowly   Complete by: As directed       Allergies as of 08/02/2024       Reactions   Apple Itching, Swelling, Other (See Comments)   Tongue swells and throat itches   Cherry Itching, Swelling, Other (See Comments)   Tongue swells and throat itches   Mangifera Indica Itching, Swelling, Other (See Comments)   MANGO allergy;   Nutritional Supplements Swelling   Peanut (diagnostic) Itching, Swelling   Ibuprofen  Swelling, Palpitations   Of feet  Medication List     TAKE these medications    FLUoxetine  20 MG capsule Commonly known as: PROZAC  Take 1 capsule (20 mg total) by mouth daily.   guaiFENesin  600 MG 12 hr tablet Commonly known as: MUCINEX  Take 1  tablet (600 mg total) by mouth 2 (two) times daily.   hydrOXYzine  25 MG tablet Commonly known as: ATARAX  Take 25 mg by mouth 2 (two) times daily as needed for anxiety or itching.   menthol  3 MG lozenge Commonly known as: CEPACOL Take 1 lozenge (3 mg total) by mouth as needed for sore throat.   methimazole  10 MG tablet Commonly known as: TAPAZOLE  Take 1 tablet (10 mg total) by mouth 2 (two) times daily. What changed: how much to take   nicotine  polacrilex 2 MG gum Commonly known as: NICORETTE  Take 1 each (2 mg total) by mouth as needed for smoking cessation.   phenol 1.4 % Liqd Commonly known as: CHLORASEPTIC Use as directed 1 spray in the mouth or throat as needed for throat irritation / pain.   propranolol  10 MG tablet Commonly known as: INDERAL  Take 1 tablet (10 mg total) by mouth 3 (three) times daily.   risperiDONE  0.5 MG tablet Commonly known as: RISPERDAL  Take 1 tablet (0.5 mg total) by mouth 2 (two) times daily.        Follow-up Information     Paseda, Folashade Cummings, Alison Cummings. Call in 1 week(s).   Specialty: Nurse Practitioner Contact information: [address] [place] [address] [place], [place] [address] [phone]                Allergies[1]  Consultations: psychiatry   Procedures/Studies: CT Soft Tissue Neck W Contrast Result Date: 07/10/2024 EXAM: CT NECK WITH CONTRAST 07/10/2024 10:41:04 AM TECHNIQUE: CT of the neck was performed with the administration of 75 mL of iohexol  (OMNIPAQUE ) 350 MG/ML injection. Multiplanar reformatted images are provided for review. Automated exposure control, iterative reconstruction, and/or weight based adjustment of the mA/kV was utilized to reduce the radiation dose to as low as reasonably achievable. COMPARISON: None available. CLINICAL HISTORY: Looks like bilateral swelling of the tonsils but very large with muffled voice. Eval for any fluid pockets. FINDINGS: AERODIGESTIVE TRACT: There is tonsillar swelling bilaterally with  prominent enhancement, but there is no peritonsillar abscess or drainable fluid collection. No discrete mass. No edema. SALIVARY GLANDS: The parotid and submandibular glands are unremarkable. THYROID : Unremarkable. LYMPH NODES: There is mildly prominent cervical lymphadenopathy, which appears reactive. SOFT TISSUES: No mass or fluid collection. BRAIN, ORBITS, SINUSES AND MASTOIDS: No acute abnormality. LUNGS AND MEDIASTINUM: No acute abnormality. BONES: No focal bone abnormality. IMPRESSION: 1. Bilateral tonsillar swelling with prominent enhancement compatible with tonsillitis/pharyngitis. No peritonsillar abscess or drainable fluid collection. 2. Mildly prominent cervical lymphadenopathy, appearing reactive. Electronically signed by: Evalene Coho MD 07/10/2024 10:55 AM EST RP Workstation: [id number]      Subjective: Patient seen and examined at bedside today.  Hemodynamically stable.  Comfortable.  Not in any kind of distress.  On room air.  No complaint of shortness of breath or cough.  Medically stable for discharge back to inpatient psychiatry.  Discharge Exam: Vitals:   08/02/24 0526 08/02/24 0840  BP: 115/68 115/78  Pulse: 91 (!) 109  Resp: 18 16  Temp: 98.3 F (36.8 C) 99 F (37.2 C)  SpO2: 100% 99%   Vitals:   08/01/24 2029 08/02/24 0031 08/02/24 0526 08/02/24 0840  BP: 108/66 (!) 115/50 115/68 115/78  Pulse: 100 94 91 (!) 109  Resp: 18 18 18 16   Temp: 98.2 F (36.8 C) 98 F (36.7 C) 98.3 F (36.8 C) 99 F (37.2 C)  TempSrc: Oral Oral Oral Oral  SpO2: 97% 98% 100% 99%  Weight:      Height:        General: Pt is alert, awake, not in acute distress, morbidly obese Cardiovascular: RRR, S1/S2 +, no rubs, no gallops Respiratory: CTA bilaterally, no wheezing, no rhonchi Abdominal: Soft, NT, ND, bowel sounds + Extremities: no edema, no cyanosis    The results of significant diagnostics from this hospitalization (including imaging, microbiology, ancillary and  laboratory) are listed below for reference.     Microbiology: Recent Results (from the past 240 hours)  Respiratory (~20 pathogens) panel by PCR     Status: Abnormal   Collection Time: 07/30/24 10:17 AM   Specimen: Nasopharyngeal Swab; Respiratory  Result Value Ref Range Status   Adenovirus DETECTED (A) NOT DETECTED Final   Coronavirus 229E NOT DETECTED NOT DETECTED Final    Comment: (NOTE) The Coronavirus on the Respiratory Panel, DOES NOT test for the novel  Coronavirus (2019 nCoV)    Coronavirus HKU1 NOT DETECTED NOT DETECTED Final   Coronavirus NL63 NOT DETECTED NOT DETECTED Final   Coronavirus OC43 NOT DETECTED NOT DETECTED Final   Metapneumovirus NOT DETECTED NOT DETECTED Final   Rhinovirus / Enterovirus DETECTED (A) NOT DETECTED Final   Influenza A NOT DETECTED NOT DETECTED Final   Influenza B NOT DETECTED NOT DETECTED Final   Parainfluenza Virus 1 NOT DETECTED NOT DETECTED Final   Parainfluenza Virus 2 NOT DETECTED NOT DETECTED Final   Parainfluenza Virus 3 NOT DETECTED NOT DETECTED Final   Parainfluenza Virus 4 NOT DETECTED NOT DETECTED Final   Respiratory Syncytial Virus NOT DETECTED NOT DETECTED Final   Bordetella pertussis NOT DETECTED NOT DETECTED Final   Bordetella Parapertussis NOT DETECTED NOT DETECTED Final   Chlamydophila pneumoniae NOT DETECTED NOT DETECTED Final   Mycoplasma pneumoniae NOT DETECTED NOT DETECTED Final    Comment: Performed at [facility], [address] [place], [place] [address]  SARS Coronavirus 2 by RT PCR (hospital order, performed in [facility] hospital lab) *cepheid single result test* Anterior Nasal Swab     Status: None   Collection Time: 07/30/24 10:17 AM   Specimen: Anterior Nasal Swab  Result Value Ref Range Status   SARS Coronavirus 2 by RT PCR NEGATIVE NEGATIVE Final    Comment: (NOTE) SARS-CoV-2 target nucleic acids are NOT DETECTED.  The SARS-CoV-2 RNA is generally detectable in upper and lower respiratory specimens  during the acute phase of infection. The lowest concentration of SARS-CoV-2 viral copies this assay can detect is 250 copies / mL. A negative result does not preclude SARS-CoV-2 infection and should not be used as the sole basis for treatment or other patient management decisions.  A negative result may occur with improper specimen collection / handling, submission of specimen other than nasopharyngeal swab, presence of viral mutation(s) within the areas targeted by this assay, and inadequate number of viral copies (<250 copies / mL). A negative result must be combined with clinical observations, patient history, and epidemiological information.  Fact Sheet for Patients:   roadlaptop.co.za  Fact Sheet for Healthcare Providers: http://kim-miller.com/  This test is not yet approved or  cleared by the [place]  FDA and has been authorized for detection and/or diagnosis of SARS-CoV-2 by FDA under an Emergency Use Authorization (EUA).  This EUA will remain in effect (meaning  this test can be used) for the duration of the COVID-19 declaration under Section 564(b)(1) of the Act, 21 U.S.C. section 360bbb-3(b)(1), unless the authorization is terminated or revoked sooner.  Performed at [facility], [address] [place], [place] [address]      Labs: BNP (last 3 results) No results for input(s): BNP in the last 8760 hours. Basic Metabolic Panel: Recent Labs  Lab 07/29/24 0011 07/31/24 1652 08/01/24 1532  NA 137 135 134*  K 4.3 4.6 5.1  CL 104 102 99  CO2 22 24 24   GLUCOSE 90 79 150*  BUN 22* 16 19  CREATININE 0.70 0.61 0.89  CALCIUM 8.8* 9.5 9.5   Liver Function Tests: Recent Labs  Lab 07/29/24 0011 08/01/24 1532  AST 31 29  ALT 39 55*  ALKPHOS 83 87  BILITOT 0.4 0.4  PROT 7.4 7.6  ALBUMIN 3.9 3.9   No results for input(s): LIPASE, AMYLASE in the last 168 hours. No results for input(s):  AMMONIA in the last 168 hours. CBC: Recent Labs  Lab 07/29/24 0011 07/31/24 1652 08/01/24 1532 08/02/24 0840  WBC 6.0 4.4 2.5* 5.0  NEUTROABS  --  1.7  --   --   HGB 12.3 13.3 12.6 11.8*  HCT 37.9 41.1 37.4 36.8  MCV 82.8 81.4 79.9* 81.4  PLT 140* 107* 139* 119*   Cardiac Enzymes: No results for input(s): CKTOTAL, CKMB, CKMBINDEX, TROPONINI in the last 168 hours. BNP: Invalid input(s): POCBNP CBG: Recent Labs  Lab 08/01/24 1721 08/02/24 0010 08/02/24 0525 08/02/24 1130  GLUCAP 162* 135* 107* 139*   D-Dimer No results for input(s): DDIMER in the last 72 hours. Hgb A1c No results for input(s): HGBA1C in the last 72 hours. Lipid Profile No results for input(s): CHOL, HDL, LDLCALC, TRIG, CHOLHDL, LDLDIRECT in the last 72 hours. Thyroid  function studies Recent Labs    07/31/24 1652  TSH <0.100*  T3FREE 18.8*   Anemia work up No results for input(s): VITAMINB12, FOLATE, FERRITIN, TIBC, IRON, RETICCTPCT in the last 72 hours. Urinalysis    Component Value Date/Time   COLORURINE YELLOW 01/18/2024 2330   APPEARANCEUR CLEAR 01/18/2024 2330   LABSPEC 1.027 01/18/2024 2330   PHURINE 5.0 01/18/2024 2330   GLUCOSEU NEGATIVE 01/18/2024 2330   HGBUR NEGATIVE 01/18/2024 2330   BILIRUBINUR NEGATIVE 01/18/2024 2330   KETONESUR NEGATIVE 01/18/2024 2330   PROTEINUR NEGATIVE 01/18/2024 2330   UROBILINOGEN 0.2 05/19/2021 1417   NITRITE NEGATIVE 01/18/2024 2330   LEUKOCYTESUR NEGATIVE 01/18/2024 2330   Sepsis Labs Recent Labs  Lab 07/29/24 0011 07/31/24 1652 08/01/24 1532 08/02/24 0840  WBC 6.0 4.4 2.5* 5.0   Microbiology Recent Results (from the past 240 hours)  Respiratory (~20 pathogens) panel by PCR     Status: Abnormal   Collection Time: 07/30/24 10:17 AM   Specimen: Nasopharyngeal Swab; Respiratory  Result Value Ref Range Status   Adenovirus DETECTED (A) NOT DETECTED Final   Coronavirus 229E NOT DETECTED NOT DETECTED  Final    Comment: (NOTE) The Coronavirus on the Respiratory Panel, DOES NOT test for the novel  Coronavirus (2019 nCoV)    Coronavirus HKU1 NOT DETECTED NOT DETECTED Final   Coronavirus NL63 NOT DETECTED NOT DETECTED Final   Coronavirus OC43 NOT DETECTED NOT DETECTED Final   Metapneumovirus NOT DETECTED NOT DETECTED Final   Rhinovirus / Enterovirus DETECTED (A) NOT DETECTED Final   Influenza A NOT DETECTED NOT DETECTED Final   Influenza B NOT DETECTED NOT DETECTED Final   Parainfluenza  Virus 1 NOT DETECTED NOT DETECTED Final   Parainfluenza Virus 2 NOT DETECTED NOT DETECTED Final   Parainfluenza Virus 3 NOT DETECTED NOT DETECTED Final   Parainfluenza Virus 4 NOT DETECTED NOT DETECTED Final   Respiratory Syncytial Virus NOT DETECTED NOT DETECTED Final   Bordetella pertussis NOT DETECTED NOT DETECTED Final   Bordetella Parapertussis NOT DETECTED NOT DETECTED Final   Chlamydophila pneumoniae NOT DETECTED NOT DETECTED Final   Mycoplasma pneumoniae NOT DETECTED NOT DETECTED Final    Comment: Performed at [facility], [address], [place], [place] [address]  SARS Coronavirus 2 by RT PCR (hospital order, performed in [facility] hospital lab) *cepheid single result test* Anterior Nasal Swab     Status: None   Collection Time: 07/30/24 10:17 AM   Specimen: Anterior Nasal Swab  Result Value Ref Range Status   SARS Coronavirus 2 by RT PCR NEGATIVE NEGATIVE Final    Comment: (NOTE) SARS-CoV-2 target nucleic acids are NOT DETECTED.  The SARS-CoV-2 RNA is generally detectable in upper and lower respiratory specimens during the acute phase of infection. The lowest concentration of SARS-CoV-2 viral copies this assay can detect is 250 copies / mL. A negative result does not preclude SARS-CoV-2 infection and should not be used as the sole basis for treatment or other patient management decisions.  A negative result may occur with improper specimen collection / handling, submission of  specimen other than nasopharyngeal swab, presence of viral mutation(s) within the areas targeted by this assay, and inadequate number of viral copies (<250 copies / mL). A negative result must be combined with clinical observations, patient history, and epidemiological information.  Fact Sheet for Patients:   roadlaptop.co.za  Fact Sheet for Healthcare Providers: http://kim-miller.com/  This test is not yet approved or  cleared by the [place]  FDA and has been authorized for detection and/or diagnosis of SARS-CoV-2 by FDA under an Emergency Use Authorization (EUA).  This EUA will remain in effect (meaning this test can be used) for the duration of the COVID-19 declaration under Section 564(b)(1) of the Act, 21 U.S.C. section 360bbb-3(b)(1), unless the authorization is terminated or revoked sooner.  Performed at [facility], [address] [place], [place] [address]     Please note: You were cared for by a hospitalist during your hospital stay. Once you are discharged, your primary care physician will handle any further medical issues. Please note that NO REFILLS for any discharge medications will be authorized once you are discharged, as it is imperative that you return to your primary care physician (or establish a relationship with a primary care physician if you do not have one) for your post hospital discharge needs so that they can reassess your need for medications and monitor your lab values.    Time coordinating discharge: 40 minutes  SIGNED:   Ivonne Mustache, MD  [facility] 08/02/2024, 1:12 PM Pager [phone]  If 7PM-7AM, please contact night-coverage [email or web address] Password [email or web address]     [1]  Allergies Allergen Reactions   Apple Itching, Swelling and Other (See Comments)    Tongue swells and throat itches   Cherry Itching, Swelling and Other (See Comments)    Tongue swells and throat  itches   Mangifera Indica Itching, Swelling and Other (See Comments)    MANGO allergy;   Nutritional Supplements Swelling   Peanut (Diagnostic) Itching and Swelling   Ibuprofen  Swelling and Palpitations    Of feet

## 2024-08-02 NOTE — Progress Notes (Signed)
 Discharge medication delivered to patient at the bedside in a secure bag
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
## 2024-08-02 NOTE — Discharge Instructions (Addendum)
 [facility] mental health: [address] [place], [place] [address] Phone: [phone]  [facility] Address: [address] [place], [place] [address] Phone: [phone]  [facility] Psychiatrist [address] [place]  [phone]  [facility] Psychiatrists & Therapists - [facility] [address] [place] [address]  [phone]

## 2024-08-03 ENCOUNTER — Other Ambulatory Visit: Payer: Self-pay

## 2024-08-03 ENCOUNTER — Other Ambulatory Visit (HOSPITAL_COMMUNITY): Payer: Self-pay

## 2024-08-03 ENCOUNTER — Telehealth: Payer: Self-pay

## 2024-08-03 NOTE — Transitions of Care (Post Inpatient/ED Visit) (Signed)
" ° °  08/03/2024  Name: Alison Cummings MRN: [id number] DOB: September 29, 2000  Today's TOC FU Call Status: Today's TOC FU Call Status:: Unsuccessful Call (1st Attempt) Unsuccessful Call (1st Attempt) Date: 08/03/24  Attempted to reach the patient regarding the most recent Inpatient/ED visit.  Follow Up Plan: Additional outreach attempts will be made to reach the patient to complete the Transitions of Care (Post Inpatient/ED visit) call.   Signature  Charmaine Bloodgood, LPN [facility] Advisor [place] [facility] You Are. We Are. One [facility] Direct Dial [phone]  "

## 2024-08-04 ENCOUNTER — Telehealth: Payer: Self-pay | Admitting: Nurse Practitioner

## 2024-08-04 NOTE — Telephone Encounter (Signed)
 Copied from CRM #[id number]. Topic: Referral - Question >> Aug 04, 2024  9:31 AM Antwanette L wrote: Reason for CRM: Mitzie from the [facility] in [place] is calling to request that a new endocrinology referral be sent to [facility]. The original referral ([id number]), placed on 06/11/23, has been closed. Mitzie can be reached [phone]
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
## 2024-08-05 NOTE — Progress Notes (Deleted)
"   °  °  Cardiology Office Note Date:  08/05/2024  ID:  Alison Cummings, DOB 2001/05/07, MRN [id number] PCP:  Paseda, Folashade R, FNP  Cardiologist:  Joelle VEAR Ren Donley, MD  No chief complaint on file.    Problems Syncope TTE 6/25: 60-65% 14-day zio not done Sinus tachycardia/palpitations in setting of hyperthyroidism (Grave's) Not adherent to BB or Methimazole  M: [id number]  Visits  [facility] 1/25: TTE, 14-day zio, labs Admit 12/25: Thyrotoxicosis, depression 1/26: Zio, TSH to assess adherence, increase prop?    Discussed the use of AI scribe software for clinical note transcription with the patient, who gave verbal consent to proceed.  History of Present Illness     ROS: Please see the history of present illness. All other systems are reviewed and negative.    PHYSICAL EXAM: VS:  LMP 07/09/2024  , BMI There is no height or weight on file to calculate BMI. GEN: Well nourished, well developed, in no acute distress HEENT: normal Neck: no JVD, carotid bruits, or masses Cardiac: ***RRR; no murmurs, rubs, or gallops,no edema  Respiratory:  CTAB bilaterally, normal work of breathing GI: soft, nontender, nondistended, + BS Extremities: No LE edema Skin: warm and dry, no rash Neuro:  Strength and sensation are intact  EKG: ***  Recent Labs: Reviewed  Studies: Reviewed  ASSESSMENT AND PLAN: Alison Cummings is a 24 y.o. female who presents for new visit.  Assessment and Plan Assessment & Plan        Signed, Joelle VEAR Ren Donley, MD  08/05/2024 8:17 AM    [facility] "

## 2024-08-06 ENCOUNTER — Other Ambulatory Visit: Payer: Self-pay | Admitting: Nurse Practitioner

## 2024-08-06 DIAGNOSIS — E0590 Thyrotoxicosis, unspecified without thyrotoxic crisis or storm: Secondary | ICD-10-CM

## 2024-08-06 NOTE — Congregational Nurse Program (Signed)
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 Yes    Student Assistance N/A    Location Patient Served  [facility]    Encounter Setting Phone/Text/Email    Population Status Unhoused    Insurance Medicaid    Insurance/Financial Assistance Referral N/A    Medication Have Medication Insecurities    Medical Provider No    Medical Referrals Made [facility] PCP/[facility]    Medical Appointment Completed N/A    Screenings CN Performed (remember to also record results) NA    CNP Interventions Case Management;Navigate Healthcare System;Advocate/Support    ED Visit Averted Yes      Questionnaire   Life-Saving Intervention Made Yes          RN arranged medical appointment transportation for client. 08/09/24 pick up at 9:00 #[id number]. 08/10/24 pick up at 8:40 #[id number] 08/17/24 pick up at 1:45 #[id number] Client to call for transportation after appointment [phone]. RN texted client with information. Client also advised that pick up can be 15 minutes for or after scheduled time.   "

## 2024-08-09 ENCOUNTER — Ambulatory Visit

## 2024-08-09 DIAGNOSIS — R002 Palpitations: Secondary | ICD-10-CM

## 2024-08-09 DIAGNOSIS — Z87898 Personal history of other specified conditions: Secondary | ICD-10-CM

## 2024-08-09 DIAGNOSIS — E66812 Obesity, class 2: Secondary | ICD-10-CM

## 2024-08-09 DIAGNOSIS — E0590 Thyrotoxicosis, unspecified without thyrotoxic crisis or storm: Secondary | ICD-10-CM

## 2024-08-10 ENCOUNTER — Inpatient Hospital Stay: Payer: Self-pay | Admitting: Nurse Practitioner

## 2024-08-10 NOTE — Transitions of Care (Post Inpatient/ED Visit) (Unsigned)
" ° °  08/10/2024  Name: Alison Cummings MRN: [id number] DOB: 2001-03-21  Today's TOC FU Call Status: Today's TOC FU Call Status:: Successful TOC FU Call Completed Unsuccessful Call (1st Attempt) Date: 08/03/24 [facility] FU Call Complete Date: 08/11/23  Patient's Name and Date of Birth confirmed. Name, DOB, Unable to Confirm Identity  Transition Care Management Follow-up Telephone Call Date of Discharge: 07/23/24 [facility]charge Facility: [facility]) Type of Discharge: Inpatient Admission Primary Inpatient Discharge Diagnosis:: thryotoxicosis How have you been since you were released from the hospital?: Same Any questions or concerns?: No  Items Reviewed: Did you receive and understand the discharge instructions provided?: Yes Medications obtained,verified, and reconciled?: Yes (Medications Reviewed) Any new allergies since your discharge?: No Dietary orders reviewed?: No Do you have support at home?: Yes People in Home [RPT]: sibling(s)  Medications Reviewed Today: Medications Reviewed Today   Medications were not reviewed in this encounter     Home Care and Equipment/Supplies: Were Home Health Services Ordered?: No Any new equipment or medical supplies ordered?: No  Functional Questionnaire: Do you need assistance with meal preparation?: No Do you need assistance with eating?: No Do you have difficulty maintaining continence: No Do you need assistance with getting out of bed/getting out of a chair/moving?: No Do you have difficulty managing or taking your medications?: No  Follow up appointments reviewed: PCP Follow-up appointment confirmed?: Yes MD Provider Line Number:[phone] Given: No Date of PCP follow-up appointment?: 08/16/24 Follow-up Provider: [facility] Follow-up appointment confirmed?: Yes Date of Specialist follow-up appointment?:  (not scheduled yet) Do you need transportation to your follow-up appointment?: No Do you understand care  options if your condition(s) worsen?: Yes-patient verbalized understanding    SIGNATURE: Levon M,CMA "

## 2024-08-17 ENCOUNTER — Inpatient Hospital Stay: Admitting: Nurse Practitioner

## 2024-08-18 ENCOUNTER — Encounter: Payer: Self-pay | Admitting: Nurse Practitioner
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
## 2024-09-01 ENCOUNTER — Ambulatory Visit: Payer: Self-pay | Admitting: Nurse Practitioner

## 2024-09-02 ENCOUNTER — Ambulatory Visit: Admitting: "Endocrinology

## 2024-09-02 ENCOUNTER — Encounter: Payer: Self-pay | Admitting: Nurse Practitioner
